# Patient Record
Sex: Male | Born: 1935 | Race: Black or African American | Hispanic: No | Marital: Married | Smoking: Never smoker
Health system: Southern US, Community
[De-identification: ages and names within clinical notes are randomized; demographics above are authoritative.]

## PROBLEM LIST (undated history)

## (undated) DIAGNOSIS — D649 Anemia, unspecified: Secondary | ICD-10-CM

## (undated) DIAGNOSIS — C801 Malignant (primary) neoplasm, unspecified: Secondary | ICD-10-CM

## (undated) DIAGNOSIS — I10 Essential (primary) hypertension: Secondary | ICD-10-CM

## (undated) DIAGNOSIS — K219 Gastro-esophageal reflux disease without esophagitis: Secondary | ICD-10-CM

## (undated) DIAGNOSIS — I2510 Atherosclerotic heart disease of native coronary artery without angina pectoris: Secondary | ICD-10-CM

## (undated) DIAGNOSIS — I714 Abdominal aortic aneurysm, without rupture, unspecified: Secondary | ICD-10-CM

## (undated) DIAGNOSIS — I219 Acute myocardial infarction, unspecified: Secondary | ICD-10-CM

## (undated) DIAGNOSIS — M1990 Unspecified osteoarthritis, unspecified site: Secondary | ICD-10-CM

## (undated) DIAGNOSIS — F0390 Unspecified dementia without behavioral disturbance: Secondary | ICD-10-CM

## (undated) DIAGNOSIS — G4730 Sleep apnea, unspecified: Secondary | ICD-10-CM

## (undated) DIAGNOSIS — M069 Rheumatoid arthritis, unspecified: Secondary | ICD-10-CM

## (undated) DIAGNOSIS — Z8719 Personal history of other diseases of the digestive system: Secondary | ICD-10-CM

## (undated) HISTORY — PX: CORONARY ARTERY BYPASS GRAFT: SHX141

## (undated) HISTORY — DX: Abdominal aortic aneurysm, without rupture, unspecified: I71.40

## (undated) HISTORY — DX: Abdominal aortic aneurysm, without rupture: I71.4

---

## 1998-11-11 ENCOUNTER — Ambulatory Visit (HOSPITAL_COMMUNITY): Admission: RE | Admit: 1998-11-11 | Discharge: 1998-11-11 | Payer: Self-pay | Admitting: Internal Medicine

## 2001-11-21 ENCOUNTER — Encounter: Payer: Self-pay | Admitting: Urology

## 2001-11-21 ENCOUNTER — Encounter: Admission: RE | Admit: 2001-11-21 | Discharge: 2001-11-21 | Payer: Self-pay | Admitting: Urology

## 2001-11-26 ENCOUNTER — Encounter: Admission: RE | Admit: 2001-11-26 | Discharge: 2001-11-26 | Payer: Self-pay | Admitting: Urology

## 2001-11-26 ENCOUNTER — Encounter: Payer: Self-pay | Admitting: Urology

## 2001-12-26 ENCOUNTER — Ambulatory Visit: Admission: RE | Admit: 2001-12-26 | Discharge: 2002-03-26 | Payer: Self-pay | Admitting: Radiation Oncology

## 2002-02-14 ENCOUNTER — Encounter: Payer: Self-pay | Admitting: Urology

## 2002-02-14 ENCOUNTER — Encounter: Admission: RE | Admit: 2002-02-14 | Discharge: 2002-02-14 | Payer: Self-pay | Admitting: Urology

## 2002-03-24 ENCOUNTER — Ambulatory Visit (HOSPITAL_BASED_OUTPATIENT_CLINIC_OR_DEPARTMENT_OTHER): Admission: RE | Admit: 2002-03-24 | Discharge: 2002-03-24 | Payer: Self-pay | Admitting: Urology

## 2002-04-10 ENCOUNTER — Ambulatory Visit: Admission: RE | Admit: 2002-04-10 | Discharge: 2002-07-09 | Payer: Self-pay | Admitting: Radiation Oncology

## 2002-12-08 ENCOUNTER — Ambulatory Visit (HOSPITAL_COMMUNITY): Admission: RE | Admit: 2002-12-08 | Discharge: 2002-12-08 | Payer: Self-pay | Admitting: Ophthalmology

## 2003-12-20 ENCOUNTER — Inpatient Hospital Stay (HOSPITAL_COMMUNITY): Admission: EM | Admit: 2003-12-20 | Discharge: 2003-12-21 | Payer: Self-pay | Admitting: Emergency Medicine

## 2004-02-01 ENCOUNTER — Ambulatory Visit (HOSPITAL_COMMUNITY): Admission: RE | Admit: 2004-02-01 | Discharge: 2004-02-01 | Payer: Self-pay | Admitting: Internal Medicine

## 2004-05-17 ENCOUNTER — Ambulatory Visit (HOSPITAL_COMMUNITY): Admission: RE | Admit: 2004-05-17 | Discharge: 2004-05-17 | Payer: Self-pay | Admitting: Ophthalmology

## 2004-06-16 ENCOUNTER — Ambulatory Visit (HOSPITAL_COMMUNITY): Admission: RE | Admit: 2004-06-16 | Discharge: 2004-06-16 | Payer: Self-pay | Admitting: Internal Medicine

## 2004-12-26 ENCOUNTER — Ambulatory Visit (HOSPITAL_COMMUNITY): Admission: RE | Admit: 2004-12-26 | Discharge: 2004-12-26 | Payer: Self-pay | Admitting: Internal Medicine

## 2006-03-07 ENCOUNTER — Encounter: Payer: Self-pay | Admitting: Internal Medicine

## 2006-10-31 ENCOUNTER — Encounter: Payer: Self-pay | Admitting: Cardiology

## 2006-10-31 ENCOUNTER — Ambulatory Visit: Payer: Self-pay

## 2007-02-23 ENCOUNTER — Emergency Department (HOSPITAL_COMMUNITY): Admission: EM | Admit: 2007-02-23 | Discharge: 2007-02-23 | Payer: Self-pay | Admitting: Emergency Medicine

## 2007-02-24 ENCOUNTER — Ambulatory Visit: Payer: Self-pay | Admitting: Vascular Surgery

## 2007-02-24 ENCOUNTER — Emergency Department (HOSPITAL_COMMUNITY): Admission: EM | Admit: 2007-02-24 | Discharge: 2007-02-24 | Payer: Self-pay | Admitting: Emergency Medicine

## 2007-02-24 ENCOUNTER — Encounter: Payer: Self-pay | Admitting: Vascular Surgery

## 2007-03-14 ENCOUNTER — Ambulatory Visit (HOSPITAL_COMMUNITY): Admission: RE | Admit: 2007-03-14 | Discharge: 2007-03-14 | Payer: Self-pay | Admitting: Internal Medicine

## 2008-04-16 ENCOUNTER — Inpatient Hospital Stay (HOSPITAL_COMMUNITY): Admission: AD | Admit: 2008-04-16 | Discharge: 2008-04-21 | Payer: Self-pay | Admitting: Cardiovascular Disease

## 2008-11-02 ENCOUNTER — Emergency Department (HOSPITAL_COMMUNITY): Admission: EM | Admit: 2008-11-02 | Discharge: 2008-11-02 | Payer: Self-pay | Admitting: Emergency Medicine

## 2008-11-23 ENCOUNTER — Ambulatory Visit: Payer: Self-pay | Admitting: Cardiothoracic Surgery

## 2008-11-23 ENCOUNTER — Inpatient Hospital Stay (HOSPITAL_COMMUNITY): Admission: RE | Admit: 2008-11-23 | Discharge: 2008-12-07 | Payer: Self-pay | Admitting: Cardiovascular Disease

## 2008-11-23 HISTORY — PX: CARDIAC CATHETERIZATION: SHX172

## 2008-11-24 ENCOUNTER — Ambulatory Visit: Payer: Self-pay | Admitting: Dentistry

## 2008-11-24 ENCOUNTER — Encounter: Payer: Self-pay | Admitting: Cardiothoracic Surgery

## 2008-11-30 ENCOUNTER — Encounter: Payer: Self-pay | Admitting: Cardiothoracic Surgery

## 2008-11-30 HISTORY — PX: AORTIC VALVE REPLACEMENT (AVR)/CORONARY ARTERY BYPASS GRAFTING (CABG): SHX5725

## 2008-12-25 ENCOUNTER — Encounter: Admission: RE | Admit: 2008-12-25 | Discharge: 2008-12-25 | Payer: Self-pay | Admitting: Cardiothoracic Surgery

## 2008-12-25 ENCOUNTER — Ambulatory Visit: Payer: Self-pay | Admitting: Cardiothoracic Surgery

## 2009-01-15 ENCOUNTER — Ambulatory Visit: Payer: Self-pay | Admitting: Cardiothoracic Surgery

## 2009-01-15 ENCOUNTER — Encounter: Admission: RE | Admit: 2009-01-15 | Discharge: 2009-01-15 | Payer: Self-pay | Admitting: Cardiothoracic Surgery

## 2009-03-01 ENCOUNTER — Ambulatory Visit (HOSPITAL_COMMUNITY): Admission: RE | Admit: 2009-03-01 | Discharge: 2009-03-01 | Payer: Self-pay | Admitting: Gastroenterology

## 2009-03-01 ENCOUNTER — Encounter (INDEPENDENT_AMBULATORY_CARE_PROVIDER_SITE_OTHER): Payer: Self-pay | Admitting: Gastroenterology

## 2009-08-25 IMAGING — CR DG CHEST 2V
2 series · 2 of 2 positions shown · non-contrast
Comparison: 11/02/2008

CLINICAL DATA: Preoperative exam prior to valve replacement

CHEST - 2 VIEW

[w chest pa]
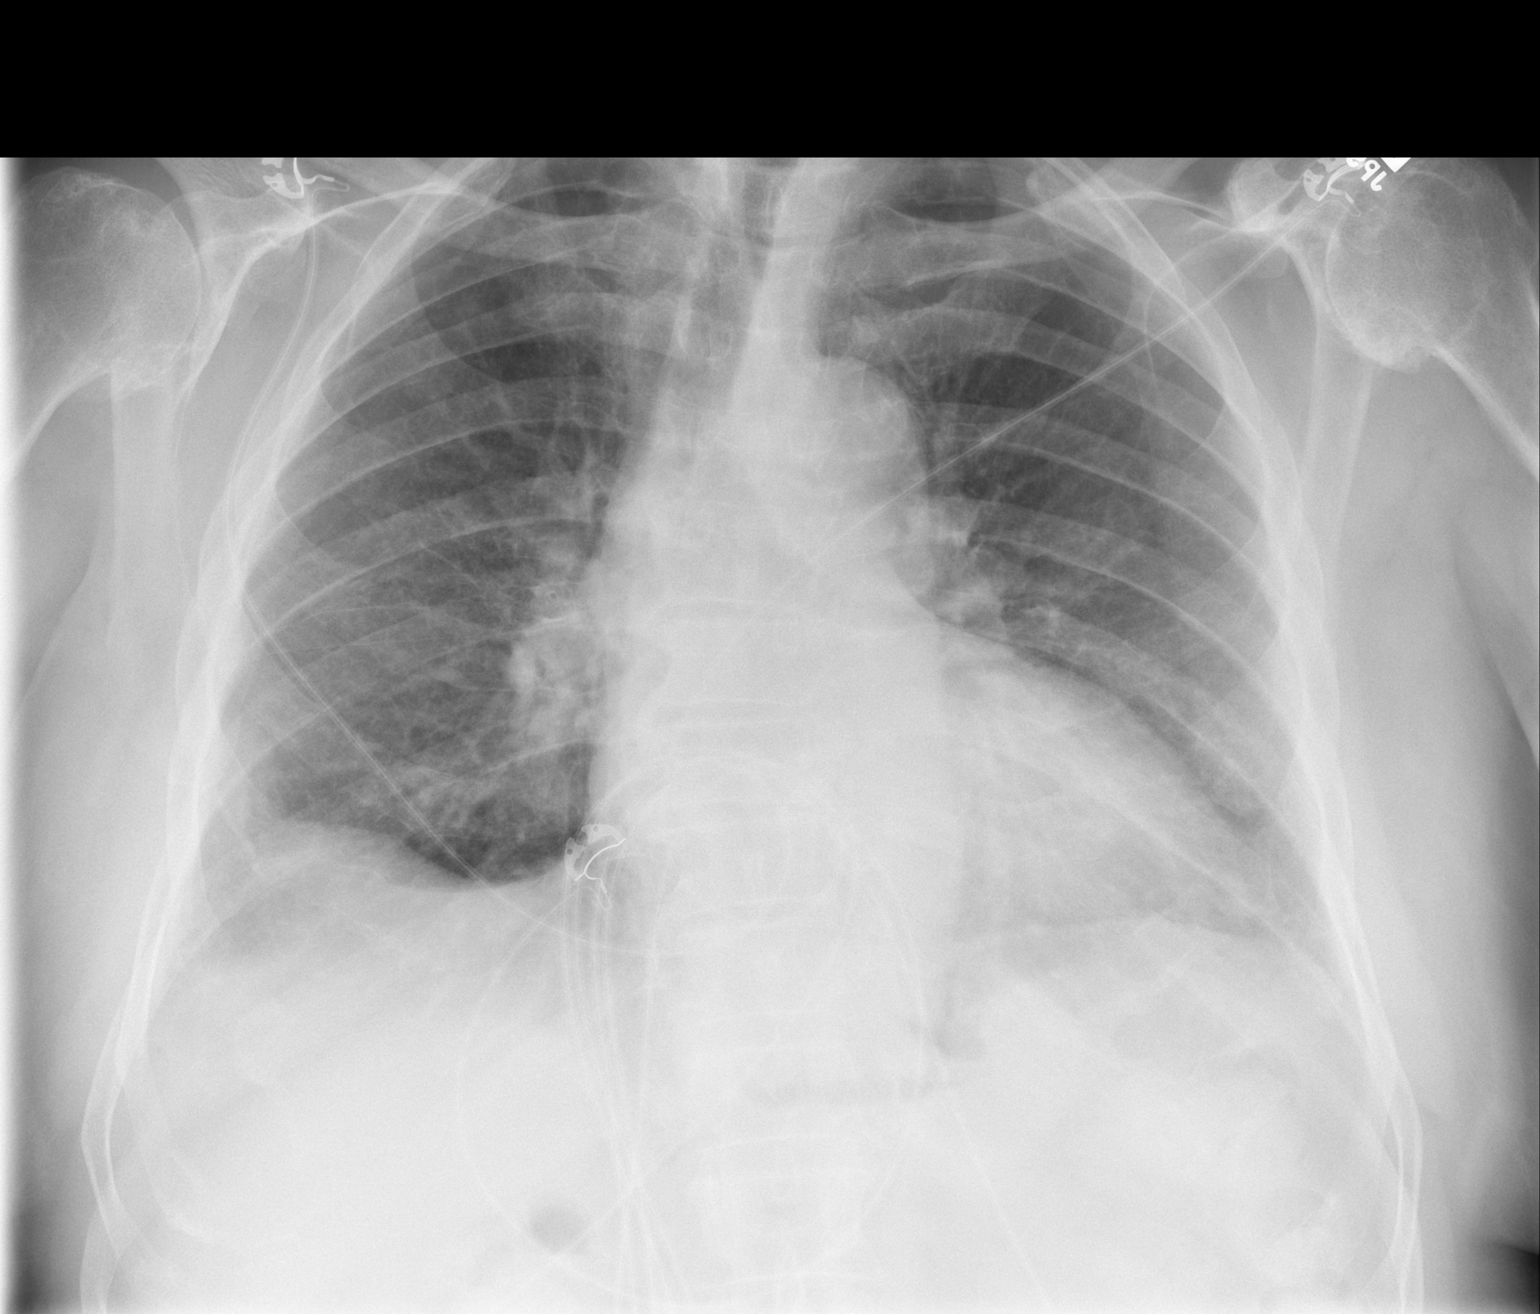

[w chest lat]
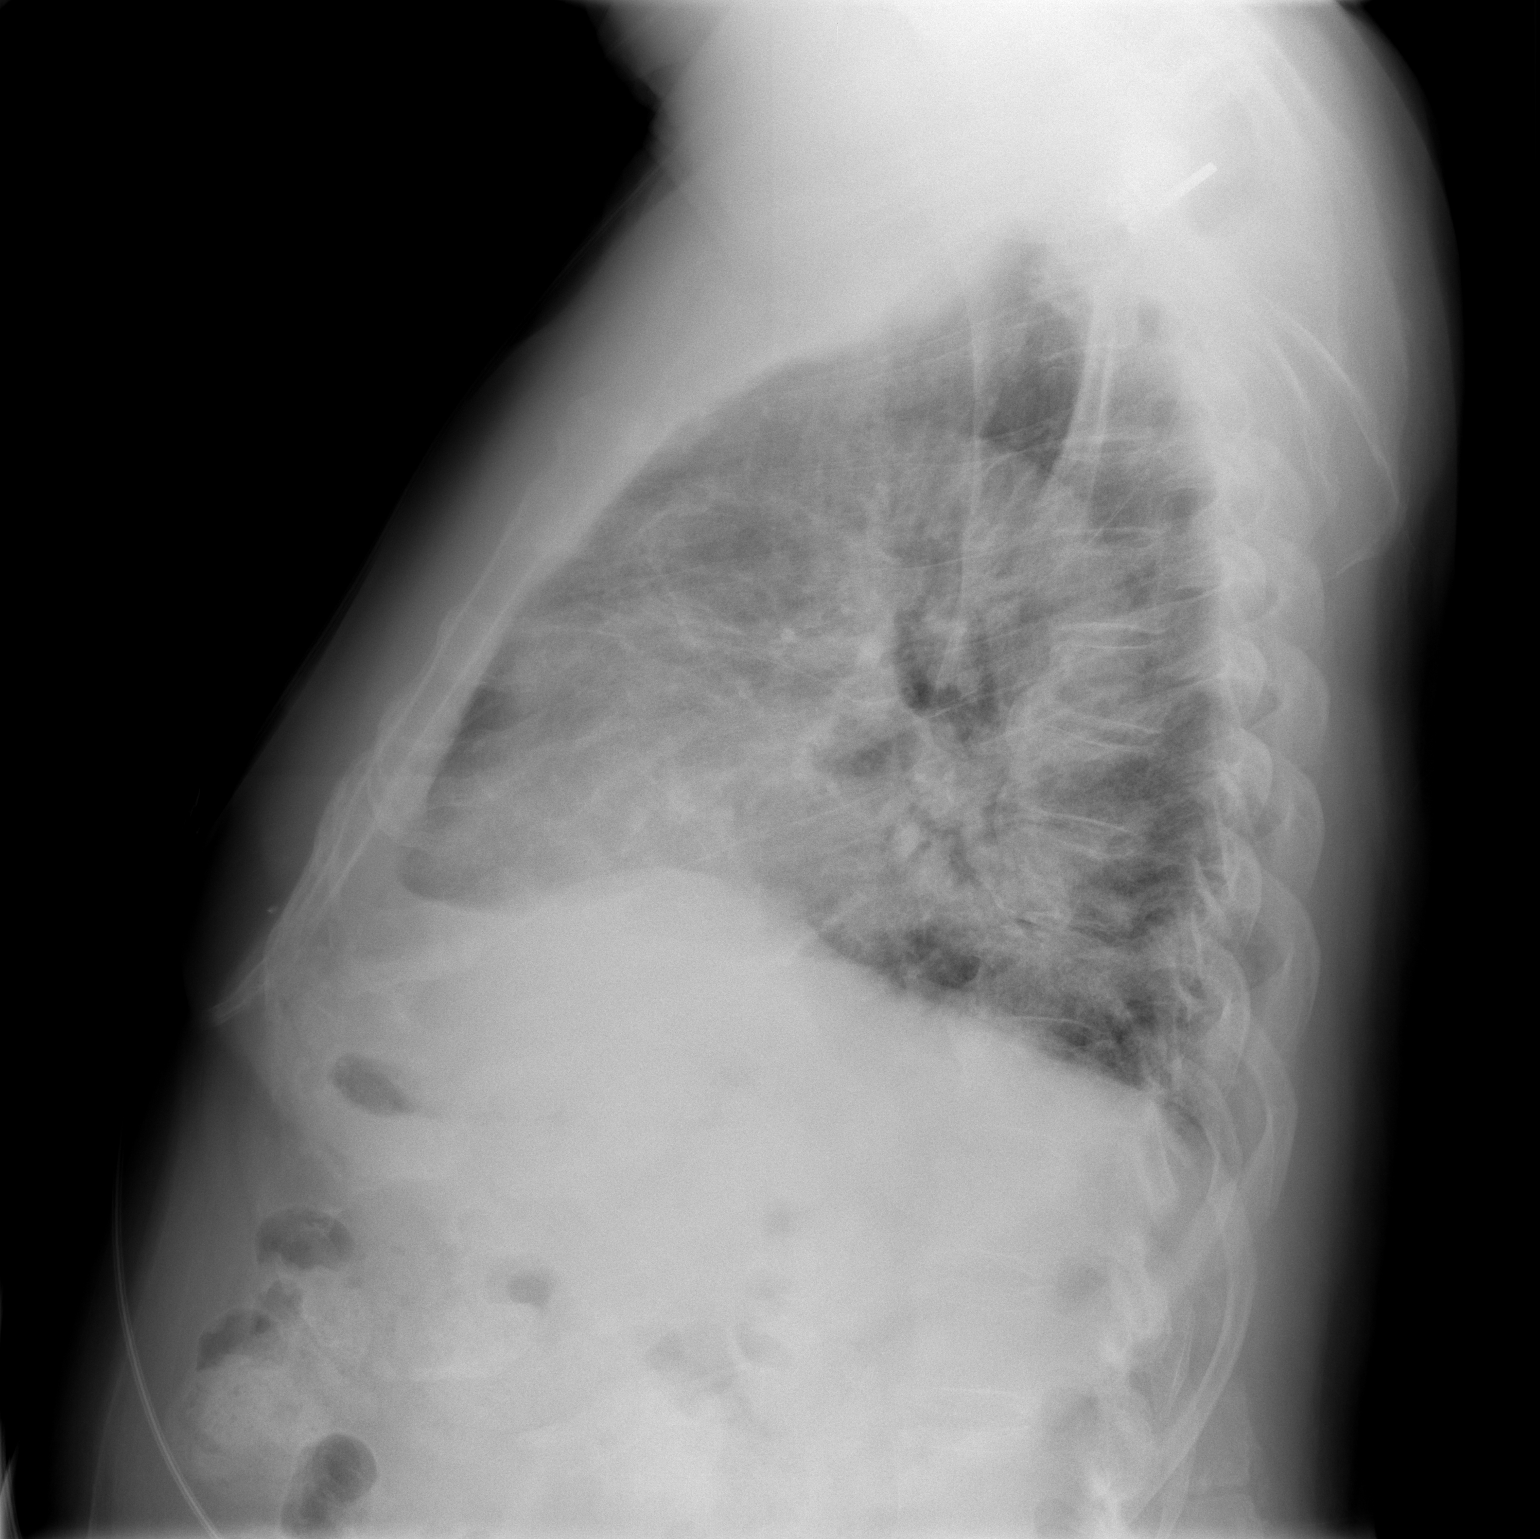

[2 of 2 positions shown; findings below may reference images not displayed]

FINDINGS: Borderline cardiomegaly is stable without edema.
Flattening of hemidiaphragms may suggest COPD.  Persistent
bilateral lower lobe scarring noted with overall hypo aeration.  No
new pulmonary opacity. Bilateral glenohumeral joint degenerative
change identified.
IMPRESSION: No acute focal finding.

## 2009-09-24 IMAGING — CR DG CHEST 2V
2 series · 2 of 2 positions shown · non-contrast
Comparison: 12/04/2008

CLINICAL DATA: Status post CABG 11/30/2008.  Shortness of breath.

CHEST - 2 VIEW

[w chest pa]
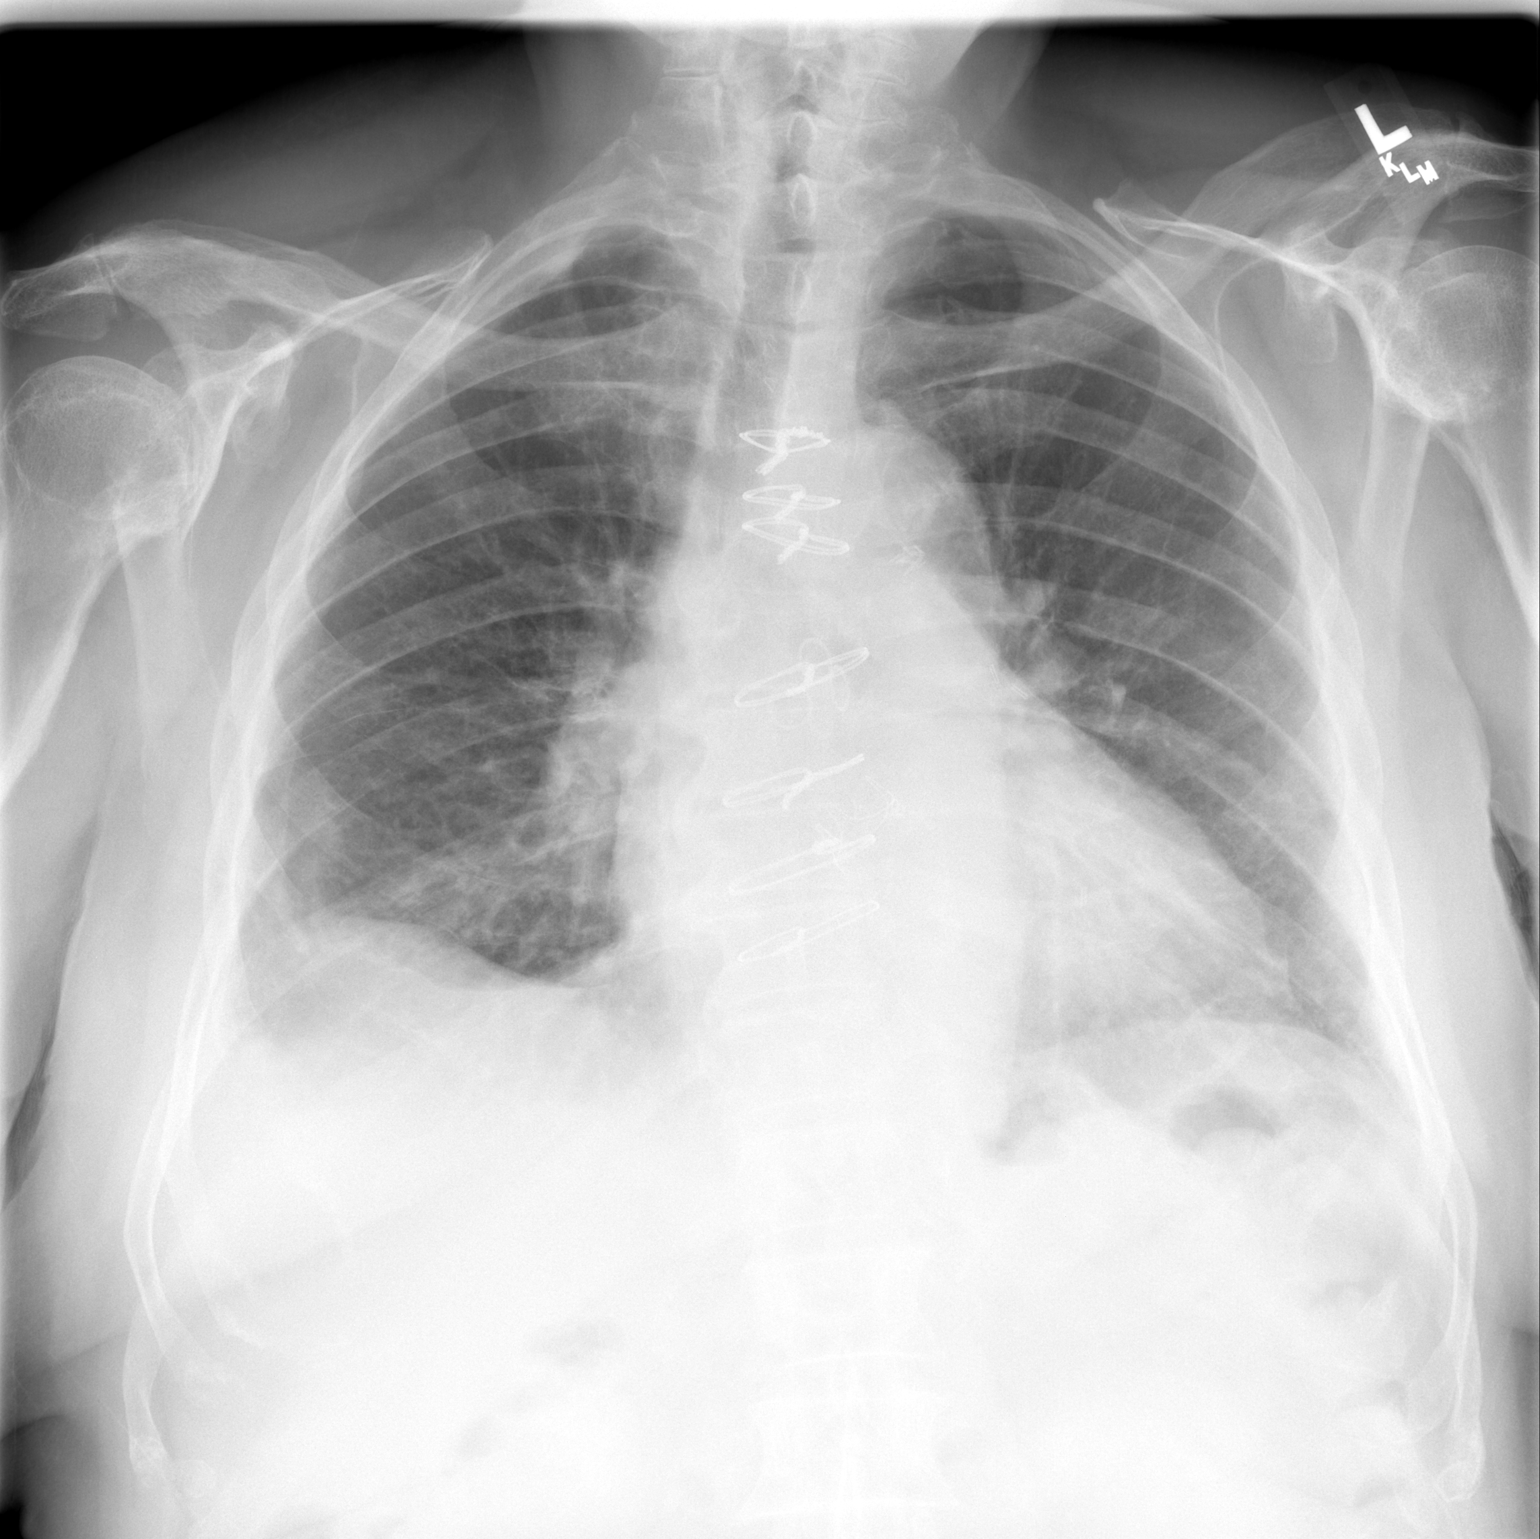

[w chest lat]
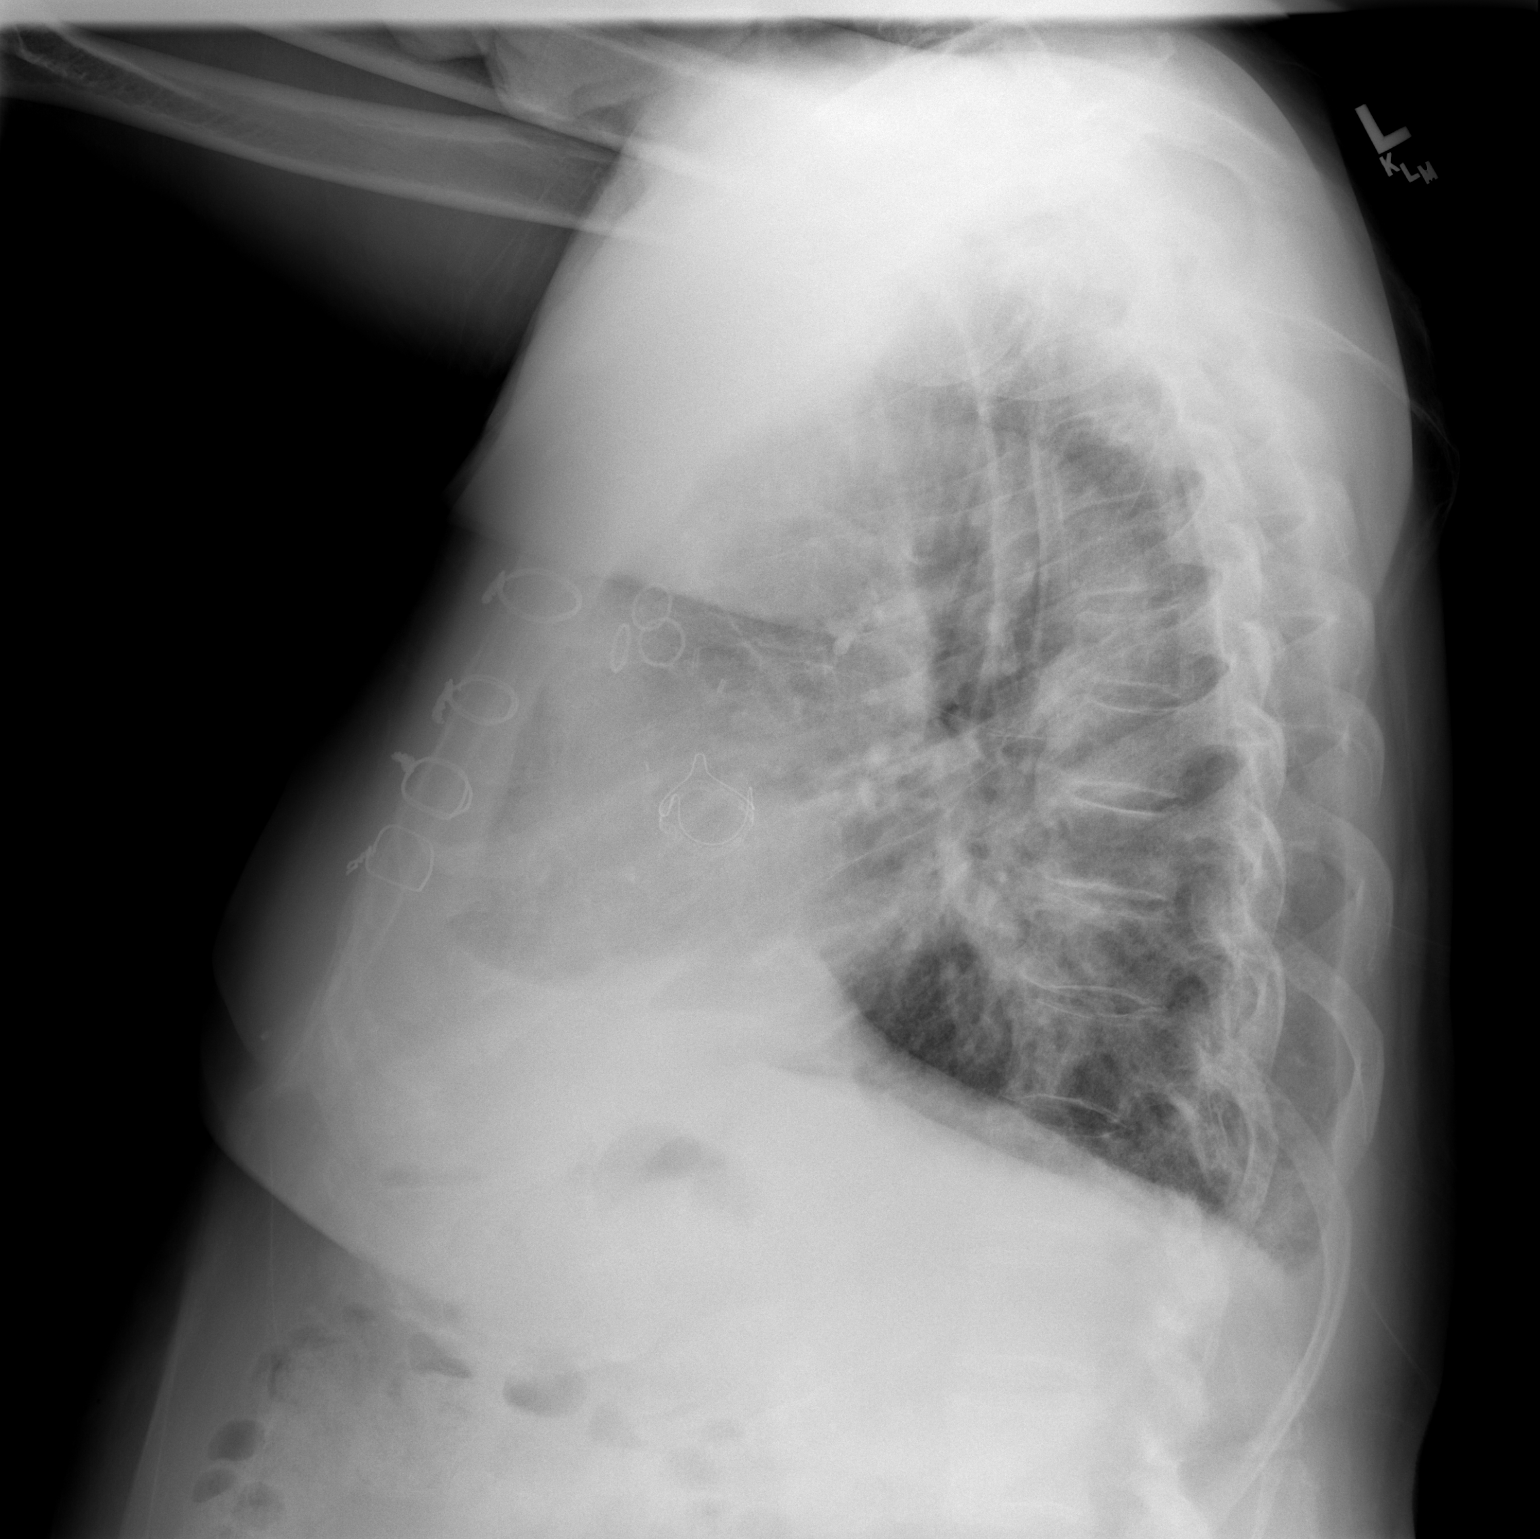

[2 of 2 positions shown; findings below may reference images not displayed]

FINDINGS: Prior median sternotomy. Aortic valve replacement.  Mid
thoracic spondylosis. Midline trachea. Mild cardiomegaly
accentuated by low lung volumes.  Resolved left and nearly resolved
tiny right pleural effusion. No pneumothorax.  Right apical pleural
thickening.  Resolved interstitial edema.  Residual pulmonary
interstitial prominence felt to be due to low volumes.  Improved
right greater than left bibasilar atelectasis with mild elevation
of the right hemidiaphragm.
IMPRESSION: 1.  Improved aeration with a tiny right pleural effusion and
decreased bibasilar atelectasis remaining.
2.  Cardiomegaly with resolved congestive failure.

## 2009-12-21 ENCOUNTER — Ambulatory Visit: Payer: Self-pay | Admitting: Vascular Surgery

## 2010-06-16 ENCOUNTER — Encounter: Admission: RE | Admit: 2010-06-16 | Discharge: 2010-08-05 | Payer: Self-pay | Admitting: Cardiovascular Disease

## 2010-11-23 ENCOUNTER — Ambulatory Visit (HOSPITAL_COMMUNITY)
Admission: RE | Admit: 2010-11-23 | Discharge: 2010-11-23 | Payer: Self-pay | Source: Home / Self Care | Attending: Gastroenterology | Admitting: Gastroenterology

## 2010-11-25 NOTE — Op Note (Signed)
  NAME:  [name] [name], [name] [name]              ACCOUNT NO.:  [id number]  MEDICAL RECORD NO.:  [id number]          PATIENT TYPE:  AMB  LOCATION:  ENDO                         FACILITY:  [facility]  PHYSICIAN:  Richard A. Alanda Amass, M.D.DATE OF BIRTH:  Mar 03, 1925  DATE OF PROCEDURE: DATE OF DISCHARGE:                              OPERATIVE REPORT   INDICATION:  Heme-positive stool, history of polyps.  MEDICATIONS: 1. Fentanyl 35 mcg IV. 2. Versed 3 mg IV.  FINDINGS:  Rectal exam was normal.  A pediatric colonoscope was inserted into a well prepped colon and on insertion, there was a 2-cm carpet like polyp in the transverse colon that was seen.  Cold biopsies were taken for histologic purposes.  The 4 mL of spot were injected in and around the polyp for tattooing.  Further insertion of the colonoscope, the ileocecal valve was noted to have a sessile appearance and biopsy was taken for histologic purposes to check for the lipoma.  The colonoscope was inserted into the cecum where the ileocecal valve and appendiceal orifice were identified.  On careful withdrawal of the colonoscope, the above findings were seen as previously described.  In the sigmoid colon, there was a 5-mm sessile polyp that was removed with snare cautery. Retroflexion was done which revealed medium-sized internal hemorrhoids.  ASSESSMENT: 1. Flat transverse polyps (carpet like appearance), status post cold     biopsies, non-amenable to endoscopic resection,  status post     tattooing. 2. Prominent ileocecal valve, status post biopsies to check for     lipoma. 3. Sigmoid colon polyps status post snare cautery. 4. Medium-sized internal hemorrhoids.  PLAN: 1. Follow up one path. 2. Surgical evaluation regarding flat polyp depending on histologic     diagnosis.  Hold Coumadin for now 3 days and then resume.     [name], MD   ______________________________ Pearletha Furl Alanda Amass, M.D.    VCS/MEDQ  D:   11/23/2010  T:  11/23/2010  Job:  [id number]  cc:   Merlene Laughter. Renae Gloss, M.D. Fax: [phone]  Electronically Signed by Charlott Rakes MD on 11/25/2010 10:45:45 AM

## 2010-11-25 NOTE — Op Note (Signed)
  NAME:  [name], [name]              ACCOUNT NO.:  [id number]  MEDICAL RECORD NO.:  [id number]          PATIENT TYPE:  AMB  LOCATION:  ENDO                         FACILITY:  [facility]  PHYSICIAN:  [name], MDDATE OF BIRTH:  05-Nov-1925  DATE OF PROCEDURE: DATE OF DISCHARGE:                              OPERATIVE REPORT   INDICATION:  Heme-positive stool.  MEDICATIONS:  Fentanyl 10 mcg IV, Versed 1 mg IV, Cetacaine spray x2, additional medicine given for preceding colonoscopy.  FINDINGS:  Endoscope was inserted into the oropharynx and esophagus was intubated which was normal in its entirety.  Endoscope was advanced into the stomach which revealed normal-appearing gastric mucosa. Retroflexion was done which revealed a small hiatal hernia but otherwise normal proximal stomach.  Endoscope was straightened and advanced into the duodenal bulb where there was a focal area of superficial clean- based ulcerations with surrounding erythema and edema.  Endoscope was advanced to the second portion of the duodenum which was unremarkable. Endoscope was withdrawn back into the stomach and a biopsy was taken for histologic purposes to check for Helicobacter pylori and inflammation. Endoscope was then withdrawn to confirm the above findings.  ASSESSMENT: 1. Duodenal ulcerations and edema consistent with duodenitis. 2. Small hiatal hernia.  PLAN: 1. Follow up on pathology. 2. Treat. 3. If biopsies positive for Helicobacter pylori, then we will treat     accordingly.     [name], MD     VCS/MEDQ  D:  11/23/2010  T:  11/23/2010  Job:  [id number]  cc:   Gerlene Burdock A. Alanda Amass, M.D. Fax: [phone]  Merlene Laughter. Renae Gloss, M.D. Fax: [phone]  Electronically Signed by Charlott Rakes MD on 11/25/2010 10:45:40 AM

## 2010-11-26 ENCOUNTER — Other Ambulatory Visit: Payer: Self-pay | Admitting: Vascular Surgery

## 2010-11-26 DIAGNOSIS — I722 Aneurysm of renal artery: Secondary | ICD-10-CM

## 2010-12-27 ENCOUNTER — Ambulatory Visit
Admission: RE | Admit: 2010-12-27 | Discharge: 2010-12-27 | Disposition: A | Discharge status: Home / Self Care | Payer: MEDICARE | Source: Ambulatory Visit | Attending: Vascular Surgery | Admitting: Vascular Surgery

## 2010-12-27 ENCOUNTER — Other Ambulatory Visit: Payer: Self-pay

## 2010-12-27 ENCOUNTER — Ambulatory Visit (INDEPENDENT_AMBULATORY_CARE_PROVIDER_SITE_OTHER): Payer: MEDICARE | Admitting: Vascular Surgery

## 2010-12-27 DIAGNOSIS — I722 Aneurysm of renal artery: Secondary | ICD-10-CM

## 2010-12-27 MED ORDER — IOHEXOL 350 MG/ML SOLN
125.0000 mL | Freq: Once | INTRAVENOUS | Status: AC | PRN
  Administered 2010-12-27: 125 mL via INTRAVENOUS

## 2010-12-28 NOTE — Assessment & Plan Note (Signed)
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 Lindaann Slough a year ago.  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  Today he returns with no change in his symptomatology.  I ordered a CT angiogram to be performed today which I have reviewed by computer.  It appears that the aortic aneurysm is unchanged at approximately 4 cm and there has been minimal change in the appearance or size of the bilateral renal artery aneurysms.  CHRONIC MEDICAL PROBLEMS: 1. Hyperlipidemia. 2. Hypertension. 3. History of prostate cancer treated with seed implants. 4. History of DVT right leg with pulmonary embolus. 5. Adult sleep apnea. 6. Negative for stroke or coronary artery disease.  FAMILY HISTORY:  Positive for coronary artery disease in his mother. Negative for diabetes and stroke.  SOCIAL HISTORY:  He quit smoking 4 years ago.  He does not use alcohol. He is retired.  REVIEW OF SYSTEMS:  Occasional chest discomfort.  He does have reflux esophagitis, occasional heartburn, urinary frequency, discomfort in his legs with walking.  All other systems are negative in a complete review of systems.  PHYSICAL EXAMINATION:  Blood pressure 144/87, heart rate 65, respirations 20.  General:  He is an obese male who is in no apparent distress, alert, and oriented x3.  HEENT:  Exam normal for age.  EOMs intact.  Lungs:  Clear to auscultation.  No rhonchi or wheezing. Cardiovascular:  Regular rhythm, no murmurs.  Carotid pulses 3+,  no audible bruits.  Abdomen:  Soft, nontender, no  pulsatile mass is appreciated.  No bruits are heard.  He has 3+ femoral pulses bilaterally with well-perfused lower extremities with 1+ edema.  As noted I did review the CT angiogram today and I do not appreciate any significant change from a year ago.  We will see him back in 2 years with  repeat CT angiogram to monitor his aortic aneurysm and bilateral renal artery aneurysms.    Quita Skye Hart [place], M.D. Electronically Signed  JDL/MEDQ  D:  12/27/2010  T:  12/28/2010  Job:  [id number]

## 2011-02-20 LAB — GLUCOSE, CAPILLARY
Glucose-Capillary: 107 mg/dL — ABNORMAL HIGH (ref 70–99)
Glucose-Capillary: 110 mg/dL — ABNORMAL HIGH (ref 70–99)
Glucose-Capillary: 117 mg/dL — ABNORMAL HIGH (ref 70–99)
Glucose-Capillary: 120 mg/dL — ABNORMAL HIGH (ref 70–99)
Glucose-Capillary: 122 mg/dL — ABNORMAL HIGH (ref 70–99)
Glucose-Capillary: 129 mg/dL — ABNORMAL HIGH (ref 70–99)
Glucose-Capillary: 132 mg/dL — ABNORMAL HIGH (ref 70–99)
Glucose-Capillary: 136 mg/dL — ABNORMAL HIGH (ref 70–99)
Glucose-Capillary: 154 mg/dL — ABNORMAL HIGH (ref 70–99)
Glucose-Capillary: 164 mg/dL — ABNORMAL HIGH (ref 70–99)
Glucose-Capillary: 167 mg/dL — ABNORMAL HIGH (ref 70–99)
Glucose-Capillary: 174 mg/dL — ABNORMAL HIGH (ref 70–99)
Glucose-Capillary: 186 mg/dL — ABNORMAL HIGH (ref 70–99)
Glucose-Capillary: 76 mg/dL (ref 70–99)
Glucose-Capillary: 96 mg/dL (ref 70–99)
Glucose-Capillary: 99 mg/dL (ref 70–99)

## 2011-02-20 LAB — URINALYSIS, ROUTINE W REFLEX MICROSCOPIC
Bilirubin Urine: NEGATIVE
Bilirubin Urine: NEGATIVE
Glucose, UA: NEGATIVE mg/dL
Glucose, UA: NEGATIVE mg/dL
Ketones, ur: NEGATIVE mg/dL
Ketones, ur: NEGATIVE mg/dL
Leukocytes, UA: NEGATIVE
Nitrite: NEGATIVE
Nitrite: NEGATIVE
Protein, ur: NEGATIVE mg/dL
Specific Gravity, Urine: 1.018 (ref 1.005–1.030)
Specific Gravity, Urine: 1.019 (ref 1.005–1.030)
Urobilinogen, UA: 1 mg/dL (ref 0.0–1.0)
pH: 5.5 (ref 5.0–8.0)
pH: 7 (ref 5.0–8.0)

## 2011-02-20 LAB — CROSSMATCH
ABO/RH(D): A POS
ABO/RH(D): A POS
Antibody Screen: NEGATIVE
Antibody Screen: NEGATIVE

## 2011-02-20 LAB — BASIC METABOLIC PANEL
BUN: 10 mg/dL (ref 6–23)
BUN: 14 mg/dL (ref 6–23)
BUN: 17 mg/dL (ref 6–23)
BUN: 19 mg/dL (ref 6–23)
BUN: 20 mg/dL (ref 6–23)
BUN: 20 mg/dL (ref 6–23)
BUN: 20 mg/dL (ref 6–23)
BUN: 22 mg/dL (ref 6–23)
BUN: 9 mg/dL (ref 6–23)
CO2: 25 mEq/L (ref 19–32)
CO2: 26 mEq/L (ref 19–32)
CO2: 27 mEq/L (ref 19–32)
CO2: 28 mEq/L (ref 19–32)
CO2: 28 mEq/L (ref 19–32)
CO2: 28 mEq/L (ref 19–32)
CO2: 32 mEq/L (ref 19–32)
Calcium: 8 mg/dL — ABNORMAL LOW (ref 8.4–10.5)
Calcium: 8.2 mg/dL — ABNORMAL LOW (ref 8.4–10.5)
Calcium: 8.2 mg/dL — ABNORMAL LOW (ref 8.4–10.5)
Calcium: 8.5 mg/dL (ref 8.4–10.5)
Calcium: 8.6 mg/dL (ref 8.4–10.5)
Calcium: 8.6 mg/dL (ref 8.4–10.5)
Calcium: 8.6 mg/dL (ref 8.4–10.5)
Calcium: 8.8 mg/dL (ref 8.4–10.5)
Calcium: 9.1 mg/dL (ref 8.4–10.5)
Calcium: 9.5 mg/dL (ref 8.4–10.5)
Chloride: 100 mEq/L (ref 96–112)
Chloride: 101 mEq/L (ref 96–112)
Chloride: 103 mEq/L (ref 96–112)
Chloride: 103 mEq/L (ref 96–112)
Chloride: 105 mEq/L (ref 96–112)
Chloride: 108 mEq/L (ref 96–112)
Chloride: 97 mEq/L (ref 96–112)
Chloride: 98 mEq/L (ref 96–112)
Creatinine, Ser: 1.23 mg/dL (ref 0.4–1.5)
Creatinine, Ser: 1.27 mg/dL (ref 0.4–1.5)
Creatinine, Ser: 1.35 mg/dL (ref 0.4–1.5)
Creatinine, Ser: 1.38 mg/dL (ref 0.4–1.5)
Creatinine, Ser: 1.45 mg/dL (ref 0.4–1.5)
Creatinine, Ser: 1.45 mg/dL (ref 0.4–1.5)
Creatinine, Ser: 1.46 mg/dL (ref 0.4–1.5)
Creatinine, Ser: 1.48 mg/dL (ref 0.4–1.5)
Creatinine, Ser: 1.63 mg/dL — ABNORMAL HIGH (ref 0.4–1.5)
GFR calc Af Amer: 49 mL/min — ABNORMAL LOW (ref >60)
GFR calc Af Amer: 55 mL/min — ABNORMAL LOW (ref >60)
GFR calc Af Amer: 56 mL/min — ABNORMAL LOW (ref >60)
GFR calc Af Amer: 57 mL/min — ABNORMAL LOW (ref >60)
GFR calc Af Amer: 57 mL/min — ABNORMAL LOW (ref >60)
GFR calc Af Amer: 59 mL/min — ABNORMAL LOW (ref >60)
GFR calc Af Amer: >60 mL/min (ref >60)
GFR calc Af Amer: >60 mL/min (ref >60)
GFR calc Af Amer: >60 mL/min (ref >60)
GFR calc Af Amer: >60 mL/min (ref >60)
GFR calc non Af Amer: 41 mL/min — ABNORMAL LOW (ref >60)
GFR calc non Af Amer: 46 mL/min — ABNORMAL LOW (ref >60)
GFR calc non Af Amer: 46 mL/min — ABNORMAL LOW (ref >60)
GFR calc non Af Amer: 47 mL/min — ABNORMAL LOW (ref >60)
GFR calc non Af Amer: 47 mL/min — ABNORMAL LOW (ref >60)
GFR calc non Af Amer: 49 mL/min — ABNORMAL LOW (ref >60)
GFR calc non Af Amer: 49 mL/min — ABNORMAL LOW (ref >60)
GFR calc non Af Amer: 51 mL/min — ABNORMAL LOW (ref >60)
GFR calc non Af Amer: 54 mL/min — ABNORMAL LOW (ref >60)
GFR calc non Af Amer: 56 mL/min — ABNORMAL LOW (ref >60)
GFR calc non Af Amer: 58 mL/min — ABNORMAL LOW (ref >60)
Glucose, Bld: 104 mg/dL — ABNORMAL HIGH (ref 70–99)
Glucose, Bld: 104 mg/dL — ABNORMAL HIGH (ref 70–99)
Glucose, Bld: 110 mg/dL — ABNORMAL HIGH (ref 70–99)
Glucose, Bld: 115 mg/dL — ABNORMAL HIGH (ref 70–99)
Glucose, Bld: 138 mg/dL — ABNORMAL HIGH (ref 70–99)
Glucose, Bld: 142 mg/dL — ABNORMAL HIGH (ref 70–99)
Glucose, Bld: 180 mg/dL — ABNORMAL HIGH (ref 70–99)
Glucose, Bld: 97 mg/dL (ref 70–99)
Potassium: 2.9 mEq/L — ABNORMAL LOW (ref 3.5–5.1)
Potassium: 3.3 mEq/L — ABNORMAL LOW (ref 3.5–5.1)
Potassium: 3.5 mEq/L (ref 3.5–5.1)
Potassium: 3.7 mEq/L (ref 3.5–5.1)
Potassium: 3.8 mEq/L (ref 3.5–5.1)
Potassium: 3.8 mEq/L (ref 3.5–5.1)
Potassium: 4 mEq/L (ref 3.5–5.1)
Potassium: 4 mEq/L (ref 3.5–5.1)
Potassium: 4 mEq/L (ref 3.5–5.1)
Potassium: 4 mEq/L (ref 3.5–5.1)
Sodium: 129 mEq/L — ABNORMAL LOW (ref 135–145)
Sodium: 133 mEq/L — ABNORMAL LOW (ref 135–145)
Sodium: 135 mEq/L (ref 135–145)
Sodium: 137 mEq/L (ref 135–145)
Sodium: 138 mEq/L (ref 135–145)
Sodium: 139 mEq/L (ref 135–145)
Sodium: 139 mEq/L (ref 135–145)
Sodium: 139 mEq/L (ref 135–145)
Sodium: 140 mEq/L (ref 135–145)

## 2011-02-20 LAB — PREPARE FRESH FROZEN PLASMA

## 2011-02-20 LAB — CBC
HCT: 24.9 % — ABNORMAL LOW (ref 39.0–52.0)
HCT: 24.9 % — ABNORMAL LOW (ref 39.0–52.0)
HCT: 25.1 % — ABNORMAL LOW (ref 39.0–52.0)
HCT: 26.3 % — ABNORMAL LOW (ref 39.0–52.0)
HCT: 26.7 % — ABNORMAL LOW (ref 39.0–52.0)
HCT: 26.7 % — ABNORMAL LOW (ref 39.0–52.0)
HCT: 27.3 % — ABNORMAL LOW (ref 39.0–52.0)
HCT: 27.4 % — ABNORMAL LOW (ref 39.0–52.0)
HCT: 28 % — ABNORMAL LOW (ref 39.0–52.0)
HCT: 33.4 % — ABNORMAL LOW (ref 39.0–52.0)
HCT: 34.9 % — ABNORMAL LOW (ref 39.0–52.0)
HCT: 35.2 % — ABNORMAL LOW (ref 39.0–52.0)
Hemoglobin: 11.1 g/dL — ABNORMAL LOW (ref 13.0–17.0)
Hemoglobin: 11.4 g/dL — ABNORMAL LOW (ref 13.0–17.0)
Hemoglobin: 8.1 g/dL — ABNORMAL LOW (ref 13.0–17.0)
Hemoglobin: 8.4 g/dL — ABNORMAL LOW (ref 13.0–17.0)
Hemoglobin: 8.4 g/dL — ABNORMAL LOW (ref 13.0–17.0)
Hemoglobin: 8.9 g/dL — ABNORMAL LOW (ref 13.0–17.0)
Hemoglobin: 9 g/dL — ABNORMAL LOW (ref 13.0–17.0)
Hemoglobin: 9.1 g/dL — ABNORMAL LOW (ref 13.0–17.0)
Hemoglobin: 9.1 g/dL — ABNORMAL LOW (ref 13.0–17.0)
Hemoglobin: 9.1 g/dL — ABNORMAL LOW (ref 13.0–17.0)
Hemoglobin: 9.2 g/dL — ABNORMAL LOW (ref 13.0–17.0)
MCHC: 32.5 g/dL (ref 30.0–36.0)
MCHC: 32.7 g/dL (ref 30.0–36.0)
MCHC: 32.9 g/dL (ref 30.0–36.0)
MCHC: 33.1 g/dL (ref 30.0–36.0)
MCHC: 33.2 g/dL (ref 30.0–36.0)
MCHC: 33.2 g/dL (ref 30.0–36.0)
MCHC: 33.7 g/dL (ref 30.0–36.0)
MCHC: 33.7 g/dL (ref 30.0–36.0)
MCHC: 33.9 g/dL (ref 30.0–36.0)
MCHC: 33.9 g/dL (ref 30.0–36.0)
MCHC: 34 g/dL (ref 30.0–36.0)
MCV: 87.4 fL (ref 78.0–100.0)
MCV: 87.5 fL (ref 78.0–100.0)
MCV: 87.8 fL (ref 78.0–100.0)
MCV: 88.1 fL (ref 78.0–100.0)
MCV: 88.4 fL (ref 78.0–100.0)
MCV: 88.5 fL (ref 78.0–100.0)
MCV: 88.7 fL (ref 78.0–100.0)
MCV: 89.1 fL (ref 78.0–100.0)
MCV: 89.8 fL (ref 78.0–100.0)
MCV: 89.9 fL (ref 78.0–100.0)
MCV: 90.1 fL (ref 78.0–100.0)
Platelets: 108 10*3/uL — ABNORMAL LOW (ref 150–400)
Platelets: 113 10*3/uL — ABNORMAL LOW (ref 150–400)
Platelets: 113 10*3/uL — ABNORMAL LOW (ref 150–400)
Platelets: 116 10*3/uL — ABNORMAL LOW (ref 150–400)
Platelets: 118 10*3/uL — ABNORMAL LOW (ref 150–400)
Platelets: 123 10*3/uL — ABNORMAL LOW (ref 150–400)
Platelets: 150 10*3/uL (ref 150–400)
Platelets: 158 10*3/uL (ref 150–400)
Platelets: 158 10*3/uL (ref 150–400)
Platelets: 167 10*3/uL (ref 150–400)
Platelets: 168 10*3/uL (ref 150–400)
Platelets: 184 10*3/uL (ref 150–400)
RBC: 2.77 MIL/uL — ABNORMAL LOW (ref 4.22–5.81)
RBC: 2.84 MIL/uL — ABNORMAL LOW (ref 4.22–5.81)
RBC: 2.87 MIL/uL — ABNORMAL LOW (ref 4.22–5.81)
RBC: 2.99 MIL/uL — ABNORMAL LOW (ref 4.22–5.81)
RBC: 3.02 MIL/uL — ABNORMAL LOW (ref 4.22–5.81)
RBC: 3.02 MIL/uL — ABNORMAL LOW (ref 4.22–5.81)
RBC: 3.05 MIL/uL — ABNORMAL LOW (ref 4.22–5.81)
RBC: 3.07 MIL/uL — ABNORMAL LOW (ref 4.22–5.81)
RBC: 3.1 MIL/uL — ABNORMAL LOW (ref 4.22–5.81)
RBC: 3.77 MIL/uL — ABNORMAL LOW (ref 4.22–5.81)
RBC: 3.86 MIL/uL — ABNORMAL LOW (ref 4.22–5.81)
RBC: 3.93 MIL/uL — ABNORMAL LOW (ref 4.22–5.81)
RBC: 3.99 MIL/uL — ABNORMAL LOW (ref 4.22–5.81)
RBC: 4.17 MIL/uL — ABNORMAL LOW (ref 4.22–5.81)
RDW: 13.5 % (ref 11.5–15.5)
RDW: 13.6 % (ref 11.5–15.5)
RDW: 13.7 % (ref 11.5–15.5)
RDW: 13.8 % (ref 11.5–15.5)
RDW: 13.9 % (ref 11.5–15.5)
RDW: 13.9 % (ref 11.5–15.5)
RDW: 14 % (ref 11.5–15.5)
RDW: 14 % (ref 11.5–15.5)
RDW: 14.2 % (ref 11.5–15.5)
RDW: 14.3 % (ref 11.5–15.5)
RDW: 14.4 % (ref 11.5–15.5)
WBC: 11 10*3/uL — ABNORMAL HIGH (ref 4.0–10.5)
WBC: 11.5 10*3/uL — ABNORMAL HIGH (ref 4.0–10.5)
WBC: 11.7 10*3/uL — ABNORMAL HIGH (ref 4.0–10.5)
WBC: 6.8 10*3/uL (ref 4.0–10.5)
WBC: 7.2 10*3/uL (ref 4.0–10.5)
WBC: 7.2 10*3/uL (ref 4.0–10.5)
WBC: 7.2 10*3/uL (ref 4.0–10.5)
WBC: 7.7 10*3/uL (ref 4.0–10.5)
WBC: 7.8 10*3/uL (ref 4.0–10.5)
WBC: 8.5 10*3/uL (ref 4.0–10.5)
WBC: 8.9 10*3/uL (ref 4.0–10.5)
WBC: 9.5 10*3/uL (ref 4.0–10.5)
WBC: 9.9 10*3/uL (ref 4.0–10.5)

## 2011-02-20 LAB — MAGNESIUM
Magnesium: 2 mg/dL (ref 1.5–2.5)
Magnesium: 2.2 mg/dL (ref 1.5–2.5)
Magnesium: 2.4 mg/dL (ref 1.5–2.5)
Magnesium: 2.6 mg/dL — ABNORMAL HIGH (ref 1.5–2.5)

## 2011-02-20 LAB — POCT I-STAT, CHEM 8
BUN: 15 mg/dL (ref 6–23)
Calcium, Ion: 1.1 mmol/L — ABNORMAL LOW (ref 1.12–1.32)
Calcium, Ion: 1.12 mmol/L (ref 1.12–1.32)
Chloride: 104 mEq/L (ref 96–112)
Chloride: 95 mEq/L — ABNORMAL LOW (ref 96–112)
Creatinine, Ser: 1.2 mg/dL (ref 0.4–1.5)
Creatinine, Ser: 1.8 mg/dL — ABNORMAL HIGH (ref 0.4–1.5)
Glucose, Bld: 114 mg/dL — ABNORMAL HIGH (ref 70–99)
Glucose, Bld: 138 mg/dL — ABNORMAL HIGH (ref 70–99)
Glucose, Bld: 140 mg/dL — ABNORMAL HIGH (ref 70–99)
HCT: 23 % — ABNORMAL LOW (ref 39.0–52.0)
HCT: 24 % — ABNORMAL LOW (ref 39.0–52.0)
HCT: 26 % — ABNORMAL LOW (ref 39.0–52.0)
Hemoglobin: 7.8 g/dL — CL (ref 13.0–17.0)
Hemoglobin: 8.2 g/dL — ABNORMAL LOW (ref 13.0–17.0)
Potassium: 3.3 mEq/L — ABNORMAL LOW (ref 3.5–5.1)
Potassium: 3.4 mEq/L — ABNORMAL LOW (ref 3.5–5.1)

## 2011-02-20 LAB — POCT I-STAT 3, ART BLOOD GAS (G3+)
Acid-Base Excess: 2 mmol/L (ref 0.0–2.0)
Acid-base deficit: 1 mmol/L (ref 0.0–2.0)
Acid-base deficit: 1 mmol/L (ref 0.0–2.0)
Acid-base deficit: 3 mmol/L — ABNORMAL HIGH (ref 0.0–2.0)
Bicarbonate: 23.3 mEq/L (ref 20.0–24.0)
Bicarbonate: 23.4 mEq/L (ref 20.0–24.0)
Bicarbonate: 23.6 mEq/L (ref 20.0–24.0)
Bicarbonate: 24.8 mEq/L — ABNORMAL HIGH (ref 20.0–24.0)
Bicarbonate: 26.9 mEq/L — ABNORMAL HIGH (ref 20.0–24.0)
O2 Saturation: 100 %
O2 Saturation: 100 %
O2 Saturation: 89 %
O2 Saturation: 91 %
O2 Saturation: 93 %
Patient temperature: 35.2
Patient temperature: 37.2
TCO2: 24 mmol/L (ref 0–100)
TCO2: 24 mmol/L (ref 0–100)
TCO2: 25 mmol/L (ref 0–100)
TCO2: 25 mmol/L (ref 0–100)
TCO2: 26 mmol/L (ref 0–100)
TCO2: 28 mmol/L (ref 0–100)
pCO2 arterial: 32.4 mmHg — ABNORMAL LOW (ref 35.0–45.0)
pCO2 arterial: 37.4 mmHg (ref 35.0–45.0)
pCO2 arterial: 42.4 mmHg (ref 35.0–45.0)
pH, Arterial: 7.365 (ref 7.350–7.450)
pH, Arterial: 7.373 (ref 7.350–7.450)
pH, Arterial: 7.387 (ref 7.350–7.450)
pO2, Arterial: 141 mmHg — ABNORMAL HIGH (ref 80.0–100.0)
pO2, Arterial: 258 mmHg — ABNORMAL HIGH (ref 80.0–100.0)
pO2, Arterial: 263 mmHg — ABNORMAL HIGH (ref 80.0–100.0)
pO2, Arterial: 56 mmHg — ABNORMAL LOW (ref 80.0–100.0)
pO2, Arterial: 77 mmHg — ABNORMAL LOW (ref 80.0–100.0)

## 2011-02-20 LAB — PROTIME-INR
INR: 1.1 (ref 0.00–1.49)
INR: 1.2 (ref 0.00–1.49)
INR: 1.3 (ref 0.00–1.49)
INR: 1.3 (ref 0.00–1.49)
INR: 1.4 (ref 0.00–1.49)
INR: 1.5 (ref 0.00–1.49)
INR: 1.5 (ref 0.00–1.49)
Prothrombin Time: 13.8 seconds (ref 11.6–15.2)
Prothrombin Time: 14.3 seconds (ref 11.6–15.2)
Prothrombin Time: 16 seconds — ABNORMAL HIGH (ref 11.6–15.2)
Prothrombin Time: 16.5 seconds — ABNORMAL HIGH (ref 11.6–15.2)
Prothrombin Time: 16.8 seconds — ABNORMAL HIGH (ref 11.6–15.2)
Prothrombin Time: 17.6 seconds — ABNORMAL HIGH (ref 11.6–15.2)
Prothrombin Time: 18.7 seconds — ABNORMAL HIGH (ref 11.6–15.2)
Prothrombin Time: 18.8 seconds — ABNORMAL HIGH (ref 11.6–15.2)

## 2011-02-20 LAB — PREPARE PLATELETS

## 2011-02-20 LAB — POCT I-STAT 4, (NA,K, GLUC, HGB,HCT)
Glucose, Bld: 112 mg/dL — ABNORMAL HIGH (ref 70–99)
Glucose, Bld: 94 mg/dL (ref 70–99)
HCT: 21 % — ABNORMAL LOW (ref 39.0–52.0)
HCT: 28 % — ABNORMAL LOW (ref 39.0–52.0)
HCT: 32 % — ABNORMAL LOW (ref 39.0–52.0)
HCT: 33 % — ABNORMAL LOW (ref 39.0–52.0)
Hemoglobin: 11.2 g/dL — ABNORMAL LOW (ref 13.0–17.0)
Hemoglobin: 9.5 g/dL — ABNORMAL LOW (ref 13.0–17.0)
Potassium: 3.9 mEq/L (ref 3.5–5.1)
Potassium: 4 mEq/L (ref 3.5–5.1)
Potassium: 4.1 mEq/L (ref 3.5–5.1)
Sodium: 136 mEq/L (ref 135–145)
Sodium: 137 mEq/L (ref 135–145)

## 2011-02-20 LAB — URINE MICROSCOPIC-ADD ON

## 2011-02-20 LAB — POCT I-STAT 3, VENOUS BLOOD GAS (G3P V)
Acid-base deficit: 3 mmol/L — ABNORMAL HIGH (ref 0.0–2.0)
O2 Saturation: 62 %
TCO2: 25 mmol/L (ref 0–100)
pCO2, Ven: 47.2 mmHg (ref 45.0–50.0)

## 2011-02-20 LAB — APTT
aPTT: 34 seconds (ref 24–37)
aPTT: 43 seconds — ABNORMAL HIGH (ref 24–37)

## 2011-02-20 LAB — URINE CULTURE: Colony Count: 35000

## 2011-02-20 LAB — LIPID PANEL
LDL Cholesterol: 87 mg/dL (ref 0–99)
Triglycerides: 86 mg/dL (ref <150)
VLDL: 17 mg/dL (ref 0–40)

## 2011-02-20 LAB — HEPARIN LEVEL (UNFRACTIONATED)
Heparin Unfractionated: 0.2 IU/mL — ABNORMAL LOW (ref 0.30–0.70)
Heparin Unfractionated: 0.36 IU/mL (ref 0.30–0.70)
Heparin Unfractionated: 0.4 IU/mL (ref 0.30–0.70)
Heparin Unfractionated: 0.4 IU/mL (ref 0.30–0.70)
Heparin Unfractionated: <0.1 IU/mL — ABNORMAL LOW (ref 0.30–0.70)

## 2011-02-20 LAB — CREATININE, SERUM
Creatinine, Ser: 1.15 mg/dL (ref 0.4–1.5)
Creatinine, Ser: 1.27 mg/dL (ref 0.4–1.5)
GFR calc Af Amer: >60 mL/min (ref >60)
GFR calc Af Amer: >60 mL/min (ref >60)
GFR calc non Af Amer: 54 mL/min — ABNORMAL LOW (ref >60)
GFR calc non Af Amer: >60 mL/min (ref >60)

## 2011-02-20 LAB — PLATELET COUNT: Platelets: 94 10*3/uL — ABNORMAL LOW (ref 150–400)

## 2011-02-20 LAB — BRAIN NATRIURETIC PEPTIDE: Pro B Natriuretic peptide (BNP): 165 pg/mL — ABNORMAL HIGH (ref 0.0–100.0)

## 2011-02-21 LAB — PROTIME-INR
INR: 1.7 — ABNORMAL HIGH (ref 0.00–1.49)
Prothrombin Time: 20.8 seconds — ABNORMAL HIGH (ref 11.6–15.2)

## 2011-02-21 LAB — GLUCOSE, CAPILLARY: Glucose-Capillary: 106 mg/dL — ABNORMAL HIGH (ref 70–99)
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
## 2011-03-21 NOTE — Cardiovascular Report (Signed)
NAME:  [name] [name], [name] [name]              ACCOUNT NO.:  [id number]   MEDICAL RECORD NO.:  [id number]          PATIENT TYPE:  INP   LOCATION:  2504                         FACILITY:  [facility]   PHYSICIAN:  Richard A. Alanda Amass, M.D.DATE OF BIRTH:  1924-12-16   DATE OF PROCEDURE:  11/23/2008  DATE OF DISCHARGE:                            CARDIAC CATHETERIZATION   PROCEDURES:  1. Retrograde central aortic catheterization.  2. Right heart catheterization.  3. Retrograde left heart catheterization.  4. Thermodilution cardiac output.  5. Simultaneous arterio-venous oxygen difference.  6. Left ventricular angiogram in right anterior oblique projection.  7. Aortic root angiogram in left anterior oblique projection.  8. Subselective right internal mammary artery, left internal mammary      artery.  9. Abdominal angiogram in midstream posteroanterior projection.  10.Selective coronary angiography via Judkins technique.   PROCEDURE:  The patient was admitted as a same-day admission.  Coumadin  had been held, INR was normal, and laboratory was stable.  Informed  consent was obtained to proceed with diagnostic study from the patient  and the family.  I discussed the case with his GI doctor, Dr. Bosie Clos  preoperatively and I felt that he was too high risk for outpatient  colonoscopy and that we would re-assess this after doing diagnostic  study for his known severe aortic valvular disease.  The patient has not  had any recurrent rectal bleeding.  He has been symptomatic with  clinical and echo demonstrated severe calcific aortic valvular stenosis.  He has not had angina, but he has had exertional fatigue and dyspnea as  his major symptoms.  [name] [name] has also had a DVT of the right calf  noted in August 2009, which has been treated with Coumadin therapy.  On  followup Dopplers, there has been resolution with no acute DVT and his  Coumadin was held for his diagnostic catheterization.  He does have  chronic mild swelling of his right lower extremity, and he has had  resolution of the prior right PT and peroneal vein DVT with chronic  changes in the gastroc veins and venous reflux noted.  While on  Coumadin, he had an episode of rectal bleeding and he was seen by Dr.  Bosie Clos who had considered colonoscopy as part of his workup, which was  initially scheduled as an outpatient, but we felt that his AS was too  severe for outpatient colonoscopy, so this has been put off.  The  patient has not had any recurrent rectal bleeding.  [name] [name] is an 77-  year-old married Afro American father of 20 with 10 grandchildren.  He  stopped smoking 40 years ago.  He has exogenous obesity.  He is hard-of-  hearing, systemic hypertension, and hyperlipidemia.   The patient was brought to second floor at [facility] in a postabsorptive  state after informed consent was obtained and premedication with 5 mg  Valium p.o.  He was not given any sedation during the procedure, and 1%  Xylocaine was used for local anesthesia.  The right groin was prepped  and draped in the usual manner.  CRFA and CRFV  were both entered with  single anterior puncture using an 18 thin-wall needle and a 6-French  short arterial sheath and an 8-French short venous sheaths were placed  by modified Seldinger technique without difficulty.  Right heart  catheterization was done with a triple-lumen flow-directed Swan-Ganz  catheter.  Left heart catheterization was done via retrograde approach  with an A2 6 French catheter and a straight guide wire to cross the  aortic valve.  Simultaneous LV PCW pressures were obtained along with  pullback to the PA.  Thermodilution CO/CI was obtained along with  simultaneous A-V O2 differences.  Right heart pullback pressures were  obtained.  Simultaneous LV FA and pressures were obtained.  LV angiogram  was done through the multipurpose catheter in the RAO projection at 25  mL, 12 mL per second.  Pullback  pressure of CA was performed with high-  speed recording.  His FA was similar to his almost superimposable to his  CA pressures.  Pullback pressure was used for gradient measurement and  estimated valve area.  The catheter was changed for a pigtail catheter,  and aortic root angiography was done in the LAO projection at 25 mL, 20  mL per second.  The catheter was pulled down above the level of the  renal arteries and abdominal aortic angiogram because of his tortuous  aorta, systemic hypertension, was done in the midstream PA projection at  25 mL, 20 mL per second with visualization to the mid iliac area  bilaterally.  Prior to LV angiogram, selective coronary angiography was  done with preformed 6-French Cordis coronary catheters.  Catheters  removed.  Sidearm sheath was flushed.  The patient was tolerated the  procedure well and was transferred to the holding area for sheath  removal and pressure hemostasis in stable condition.   PRESSURES:  RA:  4/8; mean 4 mmHg.  RV:  35/3; RV EDP 6 mmHg.  PA:  40/18; mean 30 mmHg.  PCW:  31/27; mean 21 mmHg.  LV:  196/4; LV EDP 28 mmHg.  CA:  124/67; mean 88 mmHg.   SVR equals 969 DSE to the minus fifth; TSVR equals 1017; TPVR equals  251; PVR equals 120 DSE to the minus fifth.   There was a 45 mm mean gradient across the aortic valve on catheter  pullback and a 60-70 mm peak-to-peak gradient.  Estimated aortic valve  area was less than 0.9 sq. cm.   Thermodilution CO/CI was 6.55/2.89 L/min/sq. M.   Estimated Fick CO/CI at the 6.68/2.94.   The aortoiliac system was quite tortuous.   Abdominal angiogram in the midstream PA projection revealed an ectatic  abdominal aorta from the suprarenal to the infrarenal position down to  the iliacs and there was at least a small-to-moderate-sized saccular  distal aortic aneurysm above the iliac bifurcation, which was widely  patent.  The iliacs were tortuous with no significant stenosis.  The  renal  arteries were single and normal bilaterally.  SMA, celiac, and IMA  were intact.   LV angiogram in the RAO projection demonstrated vigorously contracting  LV with moderately severe-severe LVH and an EF of greater than 60% with  no wall motion abnormalities.   Fluoroscopy revealed 2+ mitral annular calcification, 2 to 3+ aortic  valve calcification.   There was no mitral regurgitation present.   Aortic root injection showed a moderately ectatic aortic root.  No  aneurysm or dissection and no significant AI was present-trace.   The brachiocephalic was tortuous.  The RIMA and LIMA were both patent  and both vertebrals were antegrade.   CORONARY ANGIOGRAPHY:  Distal left main had approximately 50% eccentric  narrowing in its worse projection.   The LAD across the apex of the heart where it bifurcated had 30%  proximal and 30% proximal-mid narrowing.  No stenosis of the first  diagonal after SP-1 and the LAD bifurcated at the apex.   The large trifurcating optional diagonal had 75% segmental stenosis in  its proximal third.   The nondominant moderate-sized circumflex had 85% stenosis in the  proximal portion after a small OM-1 before a large OM-2 and AV groove  circumflex.   The right coronary was a dominant vessel.  There was catheter damping at  the ostium probably about 40% narrowing.  There was 30-40% narrowing in  the mid vessel, 40% beyond the margin, 40-50% before the PDA and PLA.  There was 60% narrowing of the proximal bifurcating PDA and no  significant narrowing of the PLA.   Catheterization reveals approximately 50% distal left main stenosis,  high-grade large OD, and moderate-sized circumflex stenosis, noncritical  dominant right and severe calcific aortic valve stenosis.   Recent echo on October 06, 2008, showed a CW velocity of 4.8 meters per  second corresponding a mean gradient of 60 mmHg.   The patient is a candidate for CABG and aortic valve replacement.    He has not had any recurrent rectal bleeding and will eventually need a  colonoscopy, although in discussion with Dr. Bosie Clos over the phone  today because of his severe AS, he would prefer not to do colonoscopy at  present, which I would agree with.  Since he has not had any recurrent  rectal bleeding off Coumadin and historically this may have been  hemorrhoidal bleeding.  The patient will get a dental consult  preoperatively and we will ask cardiovascular surgeons to see him.  In  the interim, we would start him on heparin because of his past DVT.   CATHETERIZATION DIAGNOSES:  1. Severe symptomatic calcific aortic valve stenosis.  2. Multivessel coronary artery disease as outlined above.  3. Normal left ventricular function, left ventricular hypertrophy.  4. Systemic hypertension, normal renal arteries.  5. Ectatic abdominal aorta and possible saccular distal abdominal      aortic aneurysm.  6. Exogenous obesity.  7. Hard-of-hearing.  8. Rectal bleeding, on Coumadin resolved.  9. Deep vein thrombosis in August 2009, resolved on followup Dopplers      with chronic venous insufficiency, right      lower extremity.  10.Dental caries to be evaluated.  11.Hyperlipidemia, under therapy.      Richard A. Alanda Amass, M.D.  Electronically Signed     RAW/MEDQ  D:  11/23/2008  T:  11/24/2008  Job:  [id number]   cc:   Merlene Laughter. Renae Gloss, M.D.  [name], MD  Sheliah Plane, MD  Lindaann Slough, M.D.

## 2011-03-21 NOTE — Op Note (Signed)
NAME:  [name] [name], [name] [name]              ACCOUNT NO.:  [id number]   MEDICAL RECORD NO.:  [id number]          PATIENT TYPE:  INP   LOCATION:                               FACILITY:  [facility]   PHYSICIAN:  Burna Forts, M.D.DATE OF BIRTH:  [birth year]   DATE OF PROCEDURE:  DATE OF DISCHARGE:                               OPERATIVE REPORT   INDICATIONS FOR PROCEDURE:  Mr. Jeanbaptiste is an 77 year old gentleman,  patient of Dr. Susa Griffins who presents today for coronary artery  bypass grafting and aortic valve replacement to be performed by Dr.  Kathlee Nations Trigt.  After line placement, he is taken to the OR for  routine induction of general anesthesia after which, the TEE probe is  lubricated, protected, and passed oropharyngeally into the stomach then  slightly withdrawn for imaging of the cardiac structures.   PRECARDIOPULMONARY BYPASS TEE EXAMINATION:  Left Ventricle:  The left  ventricular chamber is seen initially in a short axis view.  It reveals  significant and severe left ventricular hypertrophy that is concentric.  There is satisfactory overall contractility noted in the short axis  view; however, there is slight hypokinesis or diminution of  contractility noted in the septal and inferior walls of the ventricle  seen in this short axis view.  Papillary muscles are well outlined.  There are no other masses within.   Mitral valve:  This is a somewhat thickened mitral valve apparatus.  Both anterior and posterior leaflets are seen easily.  They appear to  open satisfactorily during diastolic inflow.  There is satisfactory  coaptation seen in the 4-chamber view.  Doppler interrogation revealed  trace mitral regurgitant flow.   Left atrium is a mildly enlarged left atrial chamber with some slight  bowing of the inner atrial septum, left to right.  There are no masses  noted within.   Aortic Valve:  The aortic valve is seen initially in a short axis view.  There is such having  deposition of calcium that the edges in any normal  anatomy of the leaflet is obscured.  There appears to be severe and  heavy calcium deposition with severe restriction of motion in this  aortic leaflets.  Color Doppler revealed 3-4 plus turbulent jet through  this narrowed orifices seen in the long-axis view.  Again, also in the  left ventricular outflow tract is a small aortic regurgitant jet, which  would be considered by [place].  Hemodynamic parameters and measurements are  conducted, which revealed by calculation in aortic valve area of around  0.7 sq .cm.  Gradients are obtained with a peak gradient of around 78-79  mmHg and mean gradient of 39-40 mmHg.   The right ventricle tricuspid valve and right atrium are essentially  normal structures.   The patient is placed on cardiopulmonary bypass.  Hypothermia is begun.  Coronary artery bypass grafting is carried out followed by aortotomy and  replacement with a #23 pericardial tissue valve.  De-airing maneuvers  were carried out and the patient is separated from coronary artery  bypass with the initial attempt.   POSTCARDIOPULMONARY BYPASS TEE  EXAMINATION:  Left Ventricle:  Again, the  left ventricular chamber seen in the short axis view.  There is good to  excellent overall contractility noted especially the anterior,  anterolateral, and lateral wall areas.  The inferior and septal wall  areas remains somewhat hypocontractile, but overall there is good left  ventricular function appreciated in this short axis view.  Multiple  views are obtained.   Aortic Valve:  In place of the diseased and calcified valve can now be  seen the struts of and the very thin leaflet edges of this pericardial  tissue valve.  The valve itself appears to be seated satisfactorily and  appropriately.  Multiple images are obtained including the short axis  and long-axis views, which again show normal and satisfactory  functioning of the replaced valve.  There  does not appear to be any  obstruction to flow.  There is no regurgitant flow or perivalvular leaks  appreciated.  This is considered to be satisfactory repair and  replacement.   The rest of the cardiac examination was as previously described.  The  patient is returned to the Cardiac Intensive Care Unit in stable  condition.           ______________________________  Burna Forts, M.D.     JTM/MEDQ  D:  11/30/2008  T:  12/01/2008  Job:  [id number]

## 2011-03-21 NOTE — Op Note (Signed)
NAME:  [name] [name], [name] [name]              ACCOUNT NO.:  [id number]   MEDICAL RECORD NO.:  [id number]          PATIENT TYPE:  INP   LOCATION:  2304                         FACILITY:  [facility]   PHYSICIAN:  Kerin Perna, M.D.  DATE OF BIRTH:  Mar 04, 1925   DATE OF PROCEDURE:  11/30/2008  DATE OF DISCHARGE:                               OPERATIVE REPORT   OPERATION:  1. Aortic valve replacement using a 23-mm pericardial Magna tissue      valve, model 3000, serial number [id number].  2. Coronary artery bypass grafting x3 (saphenous vein graft to      posterior descending, saphenous vein graft to ramus intermediate,      saphenous vein graft to distal left anterior descending).  3. Endoscopic harvest of left thigh greater saphenous vein.   PREOPERATIVE DIAGNOSIS:  Severe critical aortic stenosis with three-  vessel coronary disease.   POSTOPERATIVE DIAGNOSIS:  Severe critical aortic stenosis with three-  vessel coronary disease.   SURGEON:  Kerin Perna, MD   ASSISTANT:  Salvatore Decent. Dorris Fetch, MD and Rowe Clack, PA-C   ANESTHESIA:  General by Dr. Jacklynn Bue.   INDICATIONS:  The patient is an 77 year old gentleman with a murmur of  aortic stenosis and recent episode of syncope.  A 2-D echo showed  significant aortic stenosis with a calculated area of 0.07.  The patient  had a history of DVT and was on Coumadin.  After his INR was normalized,  he underwent a left and right heart cath.  This confirmed the diagnosis  of severe critical aortic stenosis.  He also had a 50% left main  stenosis, 75% stenosis in the ramus intermediate, moderate disease of  the distal circumflex, and 60% stenosis of the proximal posterior  descending branches of right coronary.  His EF was normal and he had a  hypertrophied heart.  It was felt that the patient would benefit from  aortic valve replacement and multivessel bypass grafting for treatment  of his severe aortic stenosis and three-vessel coronary artery  disease.   Prior to surgery, I examined the patient in his hospital room and  reviewed results of cardiac cath and echo with the patient and his  family.  I discussed the indications and expected benefits of  multivessel coronary artery bypass surgery and aortic valve replacement  for treatment of his aortic stenosis and multivessel coronary artery  disease.  I reviewed the major issues of this surgery including the  choice of a pericardial valve due to his advanced age even though he was  on Coumadin at this time.  I also reviewed the location of the surgical  incisions, use of general anesthesia, and cardiopulmonary bypass, and  expected postoperative hospital recovery.  I discussed with the patient  the risk to him of coronary artery bypass surgery including the risks of  MI, stroke, bleeding, blood transfusion requirement, infection, and  death.  After reviewing these issues, he demonstrated his understanding  and agreed to proceed with the surgery under what I felt was an informed  consent.   OPERATIVE FINDINGS:  1. Severe critical aortic  stenosis with heavily calcified valve      treated effectively with a 23-mm Magna pericardial valve      replacement.  2. Severe calcification of the ascending aorta requiring cannulation      of the arch and careful application of the crossclamp.  3. Segmental vein and disease with some scarring and even      calcification of the greater saphenous vein requiring full harvest      of the entire left leg.  The right leg had no usable vein due to      recent DVT and scarring.  4. Intraoperative anemia requiring transfusion of 3 units of packed      cells and a unit of platelets following reversal of heparin with      protamine due to persistent coagulopathy.   PROCEDURE:  The patient was brought to operative room, placed supine on  the operating table where general anesthesia was induced.  The chest,  abdomen, and legs were prepped with Betadine  and draped as a sterile  field.  A sternal incision was made as the saphenous vein was harvested  endoscopically from the left leg.  The sternum was divided with the  sternal saw.  The patient had extremely difficult exposure of the  mammary artery to a short obese stature and very stiff thick thorax and  the retractor bars would not safely visualize the mammary artery, which  was extremely lateral in location as well as scarred into the chest  wall.  An attempted harvesting the mammary artery resulted in bleeding  and my plan at that time was to proceed with vein graft only for his  bypasses.   The sternal retractor was then placed and the pericardium was opened and  suspended.  The vein unfortunately was scarred and took considerable  time to harvest both endoscopically and open technique was used in the  lower leg.  Three adequate segments of vein were prepared, but one  required a vein-to-vein anastomosis, which was used for the right  coronary graft.  Heparin was then administered and pursestring placed in  the aortic arch and right atrium and the patient was cannulated and  placed on bypass.  A left ventricular vent was placed via the right  superior pulmonary vein and cardioplegia catheters were placed for both  the antegrade and retrograde cold blood cardioplegia.  The patient was  cooled to 30 degrees and aortic crossclamp was carefully applied with  the head and the deep Trendelenburg position.  One liter of cold blood  cardioplegia was delivered and split doses between the antegrade aortic  retrograde coronary sinus catheters and there is a good cardioplegic  arrest with septal temperature dropped to less than 14 degrees.  Cardioplegia was then delivered every 20 minutes or less while the  crossclamp was in place.   The coronary anastomoses were performed first.  The first distal  anastomosis was to the posterior descending.  This was heavily diseased  and calcified with the  lumen measuring 1.0 to 1.5-mm.  Reverse saphenous  vein was sewn end-to-side with running 7-0 Prolene with good flow  through the graft.  This vein graft composed of two segments of vein  which were anastomosed to create one conduit.  The second distal  anastomosis was to the ramus intermediate branch of the circumflex.  This was a 1.5-mm vessel and a 75-80% stenosis.  The reverse saphenous  vein was sewn end-to-side with running 7-0 Prolene with good flow  through the graft.  The third distal anastomosis was to the distal LAD.  It was intramyocardial more proximally.  It was a 1.5-mm vessel and a  proximal 50-60% stenosis.  A reverse saphenous vein sewn end-to-side  with running 7-0 Prolene with good flow through the graft.  Cardioplegia  was redosed.   Attention was then directed to the aortic valve.  The aorta had a very  significant calcified plaque anteriorly across the diagonal aspect of  the anterior aortic wall going from the area of the right atrium towards  the arch.  This area was completely avoided with the crossclamp and the  aortotomy and the proximal anastomosis.  The aortotomy was performed and  the aortic valve was examined.  It had three leaflets, which were all  heavily calcified and immobile.  The valve was excised.  The calcium  debrided from the annulus.  The outflow tract was irrigated with copious  amounts of cold saline.  The annulus was sized to a 23-mm Magna  pericardial valve.  Subannular 2-0 pledgeted Ethibond sutures were then  placed around the annulus measuring 17 total.  These were then placed  through the sewing ring of the valve which had been prepared according  to protocol and the valve was seated and the sutures were tied.  There  was good apposition of the valve to the annulus without space.  There  was no obstruction of the coronary ostia.  The valve was then again  rinsed and irrigated and the aortotomy was closed in 2 layers using a 4-  0  Prolene.  Cardioplegia was redosed.   While the crossclamp was still in place, three proximal vein anastomoses  were placed on the ascending aorta and the anterior aspect with care  being to avoid involving the heavily calcified band in the aorta with  the proximal anastomoses.  Three anastomoses using running 7-0 Prolene  were performed and prior to tying down the final proximal anastomosis,  air was vented from the coronaries with a dose of retrograde warm blood  cardioplegia.   The crossclamp was then removed.  Air was aspirated from the vein grafts  and the cardioplegia catheters were removed.  The heart was cardioverted  back to a regular rhythm.  A new vent was placed in the ascending aorta  to remove any residual air.   Proximal and distal anastomoses were checked and found to be hemostatic.  Temporary pacing wires were applied.  The patient was rewarmed to 37  degrees.  The lungs were expanded.  The ventilator was resumed.  The  patient was weaned from bypass without difficulty on low-dose dopamine.  Blood pressure and cardiac output were stable.  The transesophageal echo  showed good functioning of the prosthetic valve with well preserved LV  systolic function very thick ventricle.  Protamine was administered  without adverse reaction.  There was still considerable coagulopathy.  The patient received FFP and platelets with improved coagulation  function.  The mediastinum was irrigated with warm saline.  The superior  pericardial fat was closed over the aorta.  Two mediastinal and a left  pleural chest tube were placed and brought out through separate  incisions.  The sternum was closed with interrupted steel wire.  The  pectoralis fascia was closed with a running #1 Vicryl.  Subcutaneous and  skin layers were closed in running Vicryl and sterile dressings were  applied.  Aortic crossclamp time was 130 minutes.      [name] Arista  Donata [name], M.D.  Electronically Signed      PV/MEDQ  D:  11/30/2008  T:  11/30/2008  Job:  [id number]   cc:   Gerlene Burdock A. Alanda Amass, M.D.  [facility]

## 2011-03-21 NOTE — Op Note (Signed)
NAME:  [name] [name], [name] [name]              ACCOUNT NO.:  [id number]   MEDICAL RECORD NO.:  [id number]          PATIENT TYPE:  AMB   LOCATION:  ENDO                         FACILITY:  [facility]   PHYSICIAN:  [name], MDDATE OF BIRTH:  15-May-1925   DATE OF PROCEDURE:  03/01/2009  DATE OF DISCHARGE:                               OPERATIVE REPORT   INDICATIONS:  Screening.   MEDICATIONS:  Fentanyl 50 mcg IV, Versed 5 mg IV.   FINDINGS:  Rectal exam was normal.   A pediatric Pentax colonoscope was inserted through a well prepped colon  and advanced to the cecum, where ileocecal valve and appendiceal orifice  were identified.  In order to reach the cecum, the patient had to be  turned in the supine position and abdominal pressure had to be applied  due to excessive looping.  In the cecum was a 1-cm sessile polyp that  was removed with snare cautery.  In the proximal ascending colon was a 2-  cm sessile polyp removed with snare cautery.  On further withdrawal, 3  polyps were noted in the hepatic flexure and these were removed with  sessile cautery.  One of the polypectomy sites caused oozing of blood,  which required 2 mL of epinephrine 1:10,000 injection.  On further  withdrawal, 3 subcentimeter sessile polyps were seen in the transverse  colon, which were removed with snare cautery.  In the splenic flexure,  there were 3 polyps approximately 4 mm to 8 mm in size and these were  removed with snare cautery.  In the descending colon was a flat polyp  that was attempted to be removed in piecemeal and then decision was made  to inject saline to raise the polyp up, which was successful.  The polyp  was then removed in a piecemeal fashion.  Due to oozing at the  polypectomy site, epinephrine 1 mL was injected into the base with good  bleb and blanching seen.  A 2-mm sessile polyp was attempted to be  removed with snare cautery, but was removed prior to application of  cauterization.   Epinephrine 1 mL was injected to achieve hemostasis.  Retroflexion revealed small telangiectasias in the distal rectum and  small internal hemorrhoids.   ASSESSMENT:  1. Multiple colonic polyps.  2. Nodular ileocecal valve, status post biopsy.  3. Internal hemorrhoids.  4. 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   PLAN:  1. Follow up on path.  2. Hold NSAIDs x2 weeks.  3. Hold Coumadin for another 7 days (discussed this with Dr.      Alanda Amass, who was okay with holding it another 7 days).      [name], MD  Electronically Signed     VCS/MEDQ  D:  03/01/2009  T:  03/01/2009  Job:  [id number]   cc:   Merlene Laughter. Renae Gloss, M.D.  Richard A. Alanda Amass, M.D.  Lindaann Slough, M.D.

## 2011-03-21 NOTE — Consult Note (Signed)
NEW PATIENT CONSULTATION   [name] [name], [name] [name]  DOB:  05-05-1925                                       12/21/2009  ZOXWR#:[id number]   The patient is an 77 year old male patient referred by Dr. 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  The  patient has a history of prostate cancer treated with implanted seeds.  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  The renal artery  aneurysm appears to be calcified.  He had no previous knowledge of  these.  He has had no abdominal or back symptoms and no history of renal  insufficiency.   STABLE MEDICAL PROBLEMS:  1. Hyperlipidemia.  2. Hypertension.  3. Prostate cancer.  4. History of DVT right leg and pulmonary embolus.  5. Adult sleep apnea.  6. Negative for COPD or stroke.   FAMILY HISTORY:  Positive for coronary artery disease in his mother,  negative for diabetes and stroke.   SOCIAL HISTORY:  He is married, is retired.  He quit smoking 4 years  ago.  Does not use alcohol.   REVIEW OF SYSTEMS:  Weight is 242 pounds.  Does have occasional chest  discomfort.  Has reflux esophagitis, occasional heartburn, urinary  frequency, discomfort in his legs with walking, history of DVT in the  right leg with chronic swelling, headaches, dizziness, joint pain,  depression.  All other systems in the review of systems are negative.   PHYSICAL EXAMINATION:  Vital signs:  Blood pressure 125/76, heart rate  71, respirations 14, temperature 98.4.  General:  He is an elderly obese  male patient who is in no apparent distress.  Alert and oriented x3.  He  is well-developed and well-nourished.  HEENT:  Exam  reveals EOMs intact.  Conjunctivae normal.  Chest:  Clear to auscultation.  Cardiovascular:  Regular rhythm.  No murmurs.  Neck:  Neck is supple with 3+ carotid  pulses.  No bruits audible.  Neurological:  Normal.  Abdomen:  Obese.  No palpable masses.  No bruits heard.  Musculoskeletal:  Free of any  major deformities.  Skin:  Free of rashes.  He does have bulging  varicosities in his right distal thigh and proximal medial calf with 2  cm enlargement in the right leg compared to the left.   I reviewed the report of the CT scan performed at [facility] and  also the records provided by Dr. Brunilda Payor and the laboratory values.  Creatinine is 1.5, BUN is 19.  He does have an infrarenal aortic  aneurysm and bilateral calcified renal artery aneurysms will need to be  followed but do not need treatment at this time.  We will see him back  in 1 year with a CT angiogram to more fully evaluate these and see if  there has been any enlargement in the interim.     Quita Skye Hart [place], M.D.  Electronically Signed   JDL/MEDQ  D:  12/21/2009  T:  12/22/2009  Job:  [id number]   cc:  Merlene Laughter. Renae Gloss, M.D.  Lindaann Slough, M.D.

## 2011-03-21 NOTE — Discharge Summary (Signed)
NAME:  [name] [name], [name] [name]              ACCOUNT NO.:  [id number]   MEDICAL RECORD NO.:  [id number]          PATIENT TYPE:  INP   LOCATION:  2023                         FACILITY:  [facility]   PHYSICIAN:  Richard A. Alanda Amass, M.D.DATE OF BIRTH:  12/09/1924   DATE OF ADMISSION:  04/16/2008  DATE OF DISCHARGE:                               DISCHARGE SUMMARY   DISCHARGE DIAGNOSES:  1. Deep venous thrombosis of the right lower extremity, percutaneously      inserted central line catheter stent.  Right gastrocnemius at the      junction of popliteal extending to the right posterior tibialis and      right posterior peroneal veins.  2. Critical severe aortic stenosis.  3. Peripheral vascular disease with small abdominal aortic aneurysm,      stable.  4. Hypertension.  5. Obstructive sleep apnea.  The patient is on CPAP.  6. Hyperlipidemia.  7. Obesity.   This is an 77 year old African American gentleman, patient of Dr.  Alanda Amass, who presented to our office for a discussion with Dr.  Alanda Amass prior to scheduling coronary angiogram.  The patient has a  known history of aortic stenosis that overtime progressed to a critical  level now, but he remains asymptomatic, physically active.  Due to a  high risk of cardiac death or development of congestive heart failure,  he would need to undergo aortic valve surgery and prior to that needs  angiography.   During this office visit, the patient complained of the pain in the  right upper extremity and in swelling of the right leg.  We ordered  venous Dopplers in the office that revealed DVT and patient was admitted  for anticoagulation therapy.  He remained asymptomatic from a cardiac  standpoint.  His blood pressure was stable, so we started him  immediately on heparin and Coumadin and he was in the hospital up until  the INR becomes therapeutic.  On April 21, 2008, Dr. Allyson Sabal saw the  patient and felt he would be stable for discharged to home.   LABORATORY DATA:  His hospital laboratories, hemoglobin 15.4, hematocrit  48.5, white blood cell count 8.6, and platelets 177.  Sodium 137,  potassium 4.7, chloride 103, CO2 30, BUN 18, creatinine 1.33, and  glucose 109.   INR on the day of discharge was 2.3.  TSH 1.192.  Lipid profile revealed  total cholesterol of 143, LDL 75, HDL 44, and triglycerides 045.   DISCHARGE MEDICATIONS:  1. Felodipine 10 mg daily.  2. Simvastatin 40 mg daily.  3. Toprol XL 25 mg daily.  4. Zoloft 100 mg daily.  5. Hydrochlorothiazide 25 mg daily.  6. Klor-Con 10 milliequivalents daily.  7. Aspirin 81 mg daily.  8. Coumadin 5 mg daily.   The patient will see Dr. Alanda Amass in a couple of weeks and also follow  up in [facility] for INR on Friday, April 24, 2008, and office will  call the patient to schedule this appointment.      Raymon Mutton, P.A.      Richard A. Alanda Amass, M.D.  Electronically Signed  MK/MEDQ  D:  04/21/2008  T:  04/22/2008  Job:  [id number]   cc:   [facility]  Richard A. Alanda Amass, M.D.  Merlene Laughter. Renae Gloss, M.D.
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
## 2011-03-21 NOTE — Assessment & Plan Note (Signed)
OFFICE VISIT   [name], [name]  DOB:  [birth year]                                        December 25, 2008  CHART #:  [id number]   CURRENT PROBLEMS:  1. Status post aortic valve replacement, coronary artery bypass graft      x 3 on November 30, 2008, for critical aortic stenosis with three-      vessel disease (23-mm pericardial tissue valve and vein bypass      grafts to the posterior descending, ramus intermediate, and left      anterior descending).  2. History of deep vein thrombosis of the right leg, on Coumadin.  3. Transient postoperative atrial fibrillation converting to sinus      rhythm.  4. Fluid retention with ankle edema with Lasix recently restarted.   PRESENT ILLNESS:  The patient is an 77 year old gentleman with a long  history of cardiac murmur, who was found to have critical aortic  stenosis and severe multivessel disease.  He underwent AVR, CABG x3 and  had a fairly uneventful postoperative recovery.  He was discharged home  in sinus rhythm on Coumadin and amiodarone.  He is also taking Lopressor  25 b.i.d., Lipitor 20 mg, aspirin 81 mg, Norvasc 10 mg, Zoloft, and  hydrochlorothiazide.  He denies any incisional pain.  The surgical  incisions are healing well.  He has some recent fluid retention and  weight gain for which he was started back on Lasix and potassium.  He  has had no bleeding complications from the Coumadin and home health  nurse has been following.  He has not been contacted yet by the  outpatient cardiac rehab program at [facility].   PHYSICAL EXAMINATION:  VITAL SIGNS:  Blood pressure 115/70, pulse 80 and  regular, respirations 18, and saturation 95%.  GENERAL:  He is alert and pleasant, but hard of hearing.  LUNGS:  Breath sounds are clear, but slightly diminished at the right  base.  CHEST:  Sternal incision is well healed.  CARDIAC:  Rhythm is regular without S3 gallop or murmur.  EXTREMITIES:  His leg incisions  are healing well (left leg) and he has  bilateral ankle edema.   PA and lateral chest x-ray reveals some atelectasis and a probable small  effusion at the right lung base.  The sternal wires are intact and the  cardiac silhouette is stable.   PLAN:  The patient will continue his Lasix and potassium.  We will have  the cardiac rehab program contact the patient to begin phase II rehab.  I have asked him to decrease his amiodarone to once a day.  His Coumadin  has been managed by his physician, Dr. Chestine Spore and by his cardiologist Dr.  Alanda Amass.  Hopefully that can be stopped later this spring.  Otherwise,  he is doing well and should be able to start driving and doing routine  activities in the next 1-2 weeks.  I will plan on seeing him back with a  chest x-ray in 3 weeks to monitor the small right pleural effusion.   Kerin Perna, M.D.  Electronically Signed   PV/MEDQ  D:  12/25/2008  T:  12/26/2008  Job:  [id number]   cc:   Gerlene Burdock A. Alanda Amass, M.D.  [facility]  Margaretmary Bayley, M.D.
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
## 2011-03-21 NOTE — Assessment & Plan Note (Signed)
OFFICE VISIT   [name], [name]  DOB:  [birth year]                                        January 15, 2009  CHART #:  [id number]   HISTORY:  The patient is seen today in followup status post his aortic  valve replacement and coronary artery bypass grafting x3 on 11/30/2008.  He was last seen in the office on December 25, 2008, at which time he  had some evidence of a small right-sided effusion on his chest x-ray.  At that time, he was given some additional Lasix and he returns on  today's date for followup.   On chest x-ray today's date, the right-sided effusion appears stable in  appearance.   PHYSICAL EXAMINATION:  VITAL SIGNS:  Blood pressure 115/60, pulse is 78,  respirations 18, and oxygen saturation 93% on room air.  GENERAL:  An elderly, black male in no acute distress.  PULMONARY:  Clear breath sounds throughout.  CARDIAC:  A 2/6 systolic aortic flow murmur.  EXTREMITIES:  Mild edema in the lower legs and ankles.  Incisions are  all well healed without evidence of infection.   ASSESSMENT:  The patient continues to make ongoing progress in his  recovery from open heart surgery.  This small right effusion is  asymptomatic at the current time.  At this time, he has no ongoing  surgical issues that require followup.  We will turn him over to the  care of his cardiologist and primary physician.  We will be happy to see  him at any time regarding any future surgical needs.   Rowe Clack, P.A.-C.   Sherryll Burger  D:  01/15/2009  T:  01/15/2009  Job:  [id number]   cc:   Gerlene Burdock A. Alanda Amass, M.D.  Merlene Laughter. Renae Gloss, M.D.

## 2011-03-21 NOTE — Op Note (Signed)
NAME:  HAYES, CZAJA              ACCOUNT NO.:  [id number]   MEDICAL RECORD NO.:  [id number]          PATIENT TYPE:  INP   LOCATION:  2027                         FACILITY:  [facility]   PHYSICIAN:  Charlynne Pander, D.D.S.DATE OF BIRTH:  1925/09/10   DATE OF PROCEDURE:  11/25/2008  DATE OF DISCHARGE:                               OPERATIVE REPORT   PREOPERATIVE DIAGNOSES:  1. Aortic stenosis.  2. Coronary artery disease.  3. Preaortic valve/coronary artery bypass graft procedure, dental      protocol.  4. Chronic periodontitis.  5. Apical periodontitis.  6. Rampant dental caries.  7. Bilateral mandibular large lingual tori.   POSTOPERATIVE DIAGNOSES:  1. Aortic stenosis.  2. Coronary artery disease.  3. Preaortic valve/coronary artery bypass graft procedure, dental      protocol.  4. Chronic periodontitis.  5. Apical periodontitis.  6. Rampant dental caries.  7. Bilateral mandibular large lingual tori.   OPERATIONS:  1. Extraction of remaining teeth (#'s 20, 21, 22, 23, 24, 25, 26, 27,      28, 29)  2. Two quadrants of alveoloplasty.  3. Bilateral mandibular lingual tori reductions.   SURGEON:  Charlynne Pander, DDS   ASSISTANT:  Zettie Pho (dental assistant).   Anesthesia was monitored anesthesia care per the Anesthesia Team.   MEDICATION:  1. Ancef 1 g IV prior to invasive dental procedures.  2. Local anesthesia with total utilization of 2 carpules each      containing 34 mg of lidocaine with 0.017 mg of epinephrine as well      as 2 carpules each containing 9 mg of bupivacaine with 0.009 mg of      epinephrine.   SPECIMENS:  Ten teeth that were discarded.   Drains were none.   Cultures were none.   Estimated blood loss was 50 mL.   Fluids were 450 mL of lactated Ringer solution.   Complications were none.   INDICATIONS:  The patient recently diagnosed with severe aortic stenosis  and coronary artery disease requiring aortic valve replacement  with  coronary artery bypass graft procedure.  A dental consultation was  requested as part of a preaortic valve replacement dental protocol.  The  patient was examined and treatment planned for multiple extraction of  remaining teeth with alveoplasty and preprosthetic surgery as indicated.  This treatment plan was formulated to decrease the risk and  complications associated with dental infection from affecting the  patient's systemic health and anticipated heart valve surgery.   OPERATIVE FINDINGS:  The patient was examined in operating room #2.  The  teeth were identified for extraction.  The patient noted to be affected  by chronic periodontitis, apical periodontitis, rampant dental caries,  and large bilateral mandibular lingual tori.  The aforementioned  necessitated removal of all remaining teeth with alveoloplasty and  bilateral mandibular lingual tori reductions.  The patient is noted also  to have excessive maxillary right and left tuberosities; however, the  patient did not wish to have these reduced at this time as the upper  denture was fabricated around these tuberosities.   DESCRIPTION OF  PROCEDURE:  The patient was brought to the main operating  room #2.  The patient was then placed in supine position on the  operating room table.  Monitored anesthesia care was then induced per  the Anesthesia Team.  The patient was then prepped and draped in the  usual manner for dental medicine procedure.  A time-out was performed,  the patient was identified, and procedure was  verified.  The oral  cavity was then thoroughly examined with findings noted above.  The  patient was then ready for the dental medicine procedure as follows:   Local anesthesia was administered sequentially with a total utilization  of 2 carpules each containing 34 mg of lidocaine with 0.017 mg of  epinephrine as well as 2 carpules containing 9 mg of bupivacaine with  0.009 mg of the epinephrine.   The  mandibular left and right quadrants were first approached.  The  patient was given bilateral inferior alveolar nerve blocks utilizing the  bupivacaine with epinephrine.  Further infiltration was then achieved  utilizing the lidocaine with epinephrine.  At this point in time, the  mandibular left and right quadrants were approached.  A 15 blade  incision was then made from the distal of #18 and extended to the distal  of #31.  A surgical flap was then carefully reflected.  Appropriate  amounts of buccal and interseptal bone were removed with a surgical  handpiece and bur and copious amounts of sterile saline.  The teeth were  then subluxated with a series of straight elevators.  The coronal aspect  of tooth #20 was then removed at this time leaving the root remaining.  Tooth numbers 21, 22, 23, 24, 25, 26, 27, 28, and the coronal aspect of  tooth #29 were then removed at this time with a 151 forceps.  This left  the roots in the area of tooth numbers 20 and 29 remaining.  A surgical  handpiece and bur and copious amounts of sterile saline were then  utilized to remove further bone around these remaining roots.  These  roots were then subluxated and eventually removed with rongeurs without  further complications.  Alveoplasty was then performed utilizing  rongeurs and bone file.   At this point in time, the mandibular left and mandibular right,  mandibular tori were exposed after further flap reflection.  A surgical  handpiece and bur and copious amounts of sterile saline were utilized to  remove the extensive bilateral mandibular lingual tori.  Further  alveoloplasty was then performed utilizing rongeurs and bone file.  These  surgical sites were then irrigated with copious amounts of  sterile saline x4.  At this point in time, the individual sockets were  approached, and a piece of Surgicel was placed in each of extraction  socket appropriately.  The tissues were then approximated and  trimmed  appropriately.  The mandibular left quadrant was then closed from the  distal #18 and extended to the mesial #24 utilizing 3-0 chromic gut  suture in a continuous interrupted suture technique x1.  The mandibular  right quadrant was then closed from the distal #31 and extended to the  mesial #25 utilizing 3-0 chromic gut suture in a continuous interrupted  suture technique x1.  Three individual interrupted sutures were then  placed to further close the surgical site as indicated.  At this point  in time, the entire mouth was irrigated with copious amounts of sterile  saline.  The patient was examined for complications,  seeing none, the  dental medicine procedure was deemed to be complete.  A series of 4 x 4  gauze were then placed in the mouth to aid hemostasis.  The patient was  then handed off to the Anesthesia Team for final disposition.  After  appropriate amount of time, the patient was taken to the postanesthesia  care unit with stable vital signs and good oxygenation level.  All  counts were correct for dental medicine procedure.  The patient will be  followed appropriately with continuation of Lovenox therapy started on  November 26, 2008, in the morning if no active bleeding is present.  The  anticipated heart surgery will then proceed with Dr. Donata Clay as  indicated, most likely early next week.      Charlynne Pander, D.D.S.  Electronically Signed     RFK/MEDQ  D:  11/25/2008  T:  11/25/2008  Job:  [id number]   cc:   Kerin Perna, M.D.  Richard A. Alanda Amass, M.D.

## 2011-03-24 NOTE — Op Note (Signed)
[facility]  Patient:    [name] [name], [name] [name] Visit Number: [id number] MRN: [id number]          Service Type: NES Location: NESC Attending Physician:  Lindaann Slough Dictated by:   Lindaann Slough, M.D. Proc. Date: 03/24/02 Admit Date:  03/24/2002   CC:         Maryln Gottron, M.D.   Operative Report  PREOPERATIVE DIAGNOSIS:  Adenocarcinoma of the prostate.  POSTOPERATIVE DIAGNOSIS:  Adenocarcinoma of the prostate.  PROCEDURE:  Radioactive seeds implantation.  SURGEON:  Lindaann Slough, M.D. and Maryln Gottron, M.D.  ANESTHESIA:  General.  INDICATION:  The patient is a 77 year old male who had an elevated PSA at 6.83. A prostate biopsy was positive for adenocarcinoma, Gleason score 7. Treatment options were discussed with the patient and he chose to have radiation treatment. He was referred for treatment. The plan is to treat the patient with external beam and brachytherapy. He has already been treated with the external beam. He is now scheduled for seed implantation.  DESCRIPTION OF PROCEDURE:  Under general anesthesia, the patient was prepped and draped and placed into the dorsal lithotomy position. A Foley catheter was inserted in the bladder a rectal tube was inserted in the rectum. The transducer was then inserted in the rectum. When the images obtained with the preoperative ultrasound were identical, the transducer was fixed in place, anchor needles were placed in the prostate, and a total of 80 seeds were implanted in the prostate. Then the Foley catheter was removed. A flexible cystoscope was then inserted in the bladder. There was one seed in the bladder and the seed was on a strand of five and four seeds were removed out of the strand. So, a total of 76 seeds were inserted in the prostate.  A #16 Foley catheter was then inserted in the bladder.  The patient tolerated the procedure well and left the OR in satisfactory condition to  postanesthesia care unit. Dictated by:   Lindaann Slough, M.D. Attending Physician:  Lindaann Slough DD:  03/24/02 TD:  03/24/02 Job: [id number]

## 2011-03-24 NOTE — Op Note (Signed)
NAME:  [name] [name], [name] [name]                          ACCOUNT NO.:  [id number]   MEDICAL RECORD NO.:  [id number]                   PATIENT TYPE:  OIB   LOCATION:  2899                                 FACILITY:  [facility]   PHYSICIAN:  Salley Scarlet., M.D.         DATE OF BIRTH:  15-Jun-1925   DATE OF PROCEDURE:  12/08/2002  DATE OF DISCHARGE:                                 OPERATIVE REPORT   PREOPERATIVE DIAGNOSIS:  1. Immature cataract, left eye.  2. Macular hole, left eye.   POSTOPERATIVE DIAGNOSIS:  1. Immature cataract, left eye.  2. Macular hole, left eye.   OPERATION PERFORMED:  Kelman phacoemulsification of cataract with  intraocular lens implantation in the left eye.   SURGEON:  Nadyne Coombes, M.D.   ANESTHESIA:  Local using Xylocaine 2% with Marcaine 0.75%, [place].   INDICATIONS FOR PROCEDURE:  The patient is a 77 year old gentleman who  complaints of blurring of vision with difficulty seeing to read from his  left eye.  He was evaluated and found to have a visual acuity best corrected  to 20/75 on the right, approximately 20/70 to 20/60 on the left.  He was  found to have bilateral posterior subcapsular cataract with what was felt to  be a macular hole in the left eye.  He was referred to Dr. Luciana Axe for  evaluation, who confirmed that he does have a retinal hole.  Dr. Luciana Axe  requests that these cataracts be removed from the left eye so that he could  get a better view when he does surgery to repair the macular hole of the  left eye.  The situation was carefully explained to the patient.  He was  therefore admitted now for cataract extraction of the left eye.   DESCRIPTION OF PROCEDURE:  Under the influence of IV sedation, a Van Lint  akinesia and retrobulbar anesthesia was given.  The patient was prepped and  draped in the usual manner.  The lid speculum was inserted under the upper  and lower lid of the left eye and 4-0 silk traction suture was passed  through  the belly of the rectus muscles for traction.  A fornix based  conjunctival flap was turned and hemostasis achieved by using cautery.  An  incision was then made in the sclera at the limbus using a Beaver blade.  This incision was dissected down clear cornea using crescent blade.  A side  port incision was made at the 1:30 o'clock position.  OcuCoat was injected  into the eye through the side port incision.  The anterior chamber was then  entered through the corneoscleral tunnel incision at the 11:30 o'clock  position.  An anterior capsulotomy was done using a bent 25 gauge needle.  The nucleus was hydrodissected using Xylocaine.  The KPE hand piece was  fashioned into the eye and the nucleus was emulsified without difficulty.  The residual cortical material was aspirated.  The  posterior capsule was  polished using an olive tip polisher.  The wound was widened slightly to  accommodate an oval 5 x 6 TMMA intraocular lens.  This lens was seated into  the capsular back without difficulty.  The anterior chamber was reformed and  the pupil was constricted using Miochol.  The residual OcuCoat was aspirated  from the eye.  The corneoscleral wound was closed using a single horizontal  suture of 10-0 nylon.  After ascertaining that the wound was airtight and  watertight, the conjunctiva was closed using thermal cautery.  1 cc of  Celestone and 0.5 cc of gentamicin was injected subconjunctivally.  Maxitrol  ophthalmic ointment and Pilopine ointment were applied along with a patch  and Fox shield.  The patient tolerated the procedure well and was discharged  to the post anesthesia care unit in satisfactory condition.  He was  instructed to rest today, to take Vicodin every four hours as needed for  pain and to see me in the office tomorrow for further evaluation.   DISCHARGE DIAGNOSIS:  Immature cataract, left eye.                                                Salley Scarlet., M.D.     TB/MEDQ  D:  12/08/2002  T:  12/08/2002  Job:  [id number]
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
## 2011-03-24 NOTE — Discharge Summary (Signed)
NAME:  [name] [name], [name] [name] NO.:  [id number]   MEDICAL RECORD NO.:  [id number]          PATIENT TYPE:  INP   LOCATION:  2004                         FACILITY:  [facility]   PHYSICIAN:  Kerin Perna, M.D.  DATE OF BIRTH:  03-31-1925   DATE OF ADMISSION:  11/23/2008  DATE OF DISCHARGE:  12/07/2008                               DISCHARGE SUMMARY   ADDENDUM   Mr. Mcfarren was anticipated to go home on December 06, 2008.  However, he  remained weak and deconditioned.  He had an additional episode of what  appeared to be atrial fibrillation with controlled rate.  An EKG was  performed which confirmed sinus rhythm with frequent PACs.  Because of  this, his beta-blocker dosage was titrated upward.  He was observed  closely for the next 24 hours.  Also, he developed leukocytosis, and an  urinalysis was performed which was positive for Gram-negative rods.  He  was started on p.o. Keflex for presumed urinary tract infection.  Upon  reevaluation in the afternoon on December 07, 2008, he was noted to be  afebrile with stable vital signs.  He was maintaining sinus rhythm with  PACs.  He was otherwise stable.  His labs from the previous day showed  hemoglobin of 9.0, hematocrit 26.7, platelets 184, white count 8.9.  Renal function back to baseline with BUN of 20 and creatinine 1.48.  It  was felt that since he had remained stable and was otherwise doing well,  he could be discharged at that time.   Discharge medications are unchanged from the previously dictated  discharge summary with the exception of Lopressor which was increased to  50 mg b.i.d.  Also, he was sent home on Keflex 500 mg t.i.d. x10 days.  His Coumadin dose at discharge was 4 mg daily, and he was to follow up  within 48 hours for a PT and INR.   His discharge instructions and followup are unchanged from the  previously dictated discharge summary.      Coral Ceo, P.A.      Kerin Perna, M.D.  Electronically Signed    GC/MEDQ  D:  01/20/2009  T:  01/20/2009  Job:  [id number]   cc:   Gerlene Burdock A. Alanda Amass, M.D.  Merlene Laughter. Renae Gloss, M.D.

## 2011-08-02 HISTORY — PX: CARDIOVASCULAR STRESS TEST: SHX262

## 2011-08-03 LAB — CBC
HCT: 36.7 — ABNORMAL LOW
HCT: 38.3 — ABNORMAL LOW
HCT: 39.3
Hemoglobin: 12.4 — ABNORMAL LOW
Hemoglobin: 12.6 — ABNORMAL LOW
Hemoglobin: 12.9 — ABNORMAL LOW
Hemoglobin: 13.6
Hemoglobin: 13
MCHC: 33.1
MCHC: 33.1
Platelets: 177
RBC: 4.11 — ABNORMAL LOW
RBC: 4.22
RBC: 4.3
RBC: 4.39
RBC: 4.36
RDW: 13.6
RDW: 13.8
RDW: 14.2
RDW: 13.9
WBC: 7
WBC: 7.1
WBC: 8.6

## 2011-08-03 LAB — BASIC METABOLIC PANEL
BUN: 18
Calcium: 9.1
Calcium: 9.1
Calcium: 9.7
GFR calc Af Amer: >60
GFR calc Af Amer: >60
GFR calc non Af Amer: 53 — ABNORMAL LOW
GFR calc non Af Amer: 57 — ABNORMAL LOW
GFR calc non Af Amer: 52 — ABNORMAL LOW
Glucose, Bld: 103 — ABNORMAL HIGH
Glucose, Bld: 128 — ABNORMAL HIGH
Glucose, Bld: 109 — ABNORMAL HIGH
Potassium: 3.2 — ABNORMAL LOW
Potassium: 4
Potassium: 4.7
Sodium: 133 — ABNORMAL LOW
Sodium: 141
Sodium: 137

## 2011-08-03 LAB — PROTIME-INR
INR: 1
INR: 1.1
INR: 1.1
INR: 1.4
INR: 2 — ABNORMAL HIGH
INR: 2.3 — ABNORMAL HIGH
Prothrombin Time: 13.5
Prothrombin Time: 13.9
Prothrombin Time: 14.9
Prothrombin Time: 17.8 — ABNORMAL HIGH
Prothrombin Time: 23.2 — ABNORMAL HIGH
Prothrombin Time: 25.8 — ABNORMAL HIGH

## 2011-08-03 LAB — HEPARIN LEVEL (UNFRACTIONATED)
Heparin Unfractionated: 0.31
Heparin Unfractionated: 0.75 — ABNORMAL HIGH
Heparin Unfractionated: 0.85 — ABNORMAL HIGH
Heparin Unfractionated: 0.36

## 2011-08-03 LAB — LIPID PANEL
Cholesterol: 143
Total CHOL/HDL Ratio: 3.3

## 2011-08-03 LAB — COMPREHENSIVE METABOLIC PANEL
BUN: 15
CO2: 29
Calcium: 9.4
GFR calc non Af Amer: >60
Glucose, Bld: 124 — ABNORMAL HIGH
Total Protein: 7.8

## 2011-08-03 LAB — TSH: TSH: 1.192

## 2011-08-10 LAB — CBC
HCT: 36.8 % — ABNORMAL LOW (ref 39.0–52.0)
Hemoglobin: 12.1 g/dL — ABNORMAL LOW (ref 13.0–17.0)
MCHC: 32.8 g/dL (ref 30.0–36.0)
RBC: 4.13 MIL/uL — ABNORMAL LOW (ref 4.22–5.81)
RDW: 13.8 % (ref 11.5–15.5)

## 2011-08-10 LAB — DIFFERENTIAL
Basophils Absolute: 0.1 10*3/uL (ref 0.0–0.1)
Eosinophils Relative: 2 % (ref 0–5)
Lymphocytes Relative: 24 % (ref 12–46)
Monocytes Absolute: 0.6 10*3/uL (ref 0.1–1.0)
Monocytes Relative: 8 % (ref 3–12)

## 2011-08-10 LAB — POCT I-STAT, CHEM 8
BUN: 13 mg/dL (ref 6–23)
Chloride: 104 mEq/L (ref 96–112)
Creatinine, Ser: 1.6 mg/dL — ABNORMAL HIGH (ref 0.4–1.5)
Sodium: 141 mEq/L (ref 135–145)
TCO2: 27 mmol/L (ref 0–100)

## 2011-08-10 LAB — POCT CARDIAC MARKERS
CKMB, poc: 2.9 ng/mL (ref 1.0–8.0)
Myoglobin, poc: 280 ng/mL (ref 12–200)
Troponin i, poc: <0.05 ng/mL (ref 0.00–0.09)
Troponin i, poc: <0.05 ng/mL (ref 0.00–0.09)

## 2011-08-10 LAB — ETHANOL: Alcohol, Ethyl (B): <5 mg/dL (ref 0–10)

## 2011-08-10 LAB — URINALYSIS, ROUTINE W REFLEX MICROSCOPIC
Bilirubin Urine: NEGATIVE
Glucose, UA: NEGATIVE mg/dL
Ketones, ur: NEGATIVE mg/dL
Specific Gravity, Urine: 1.014 (ref 1.005–1.030)
pH: 7.5 (ref 5.0–8.0)

## 2011-08-10 LAB — URINE MICROSCOPIC-ADD ON

## 2011-08-10 LAB — GLUCOSE, CAPILLARY: Glucose-Capillary: 94 mg/dL (ref 70–99)

## 2011-08-10 LAB — URINE CULTURE

## 2011-10-03 ENCOUNTER — Encounter (INDEPENDENT_AMBULATORY_CARE_PROVIDER_SITE_OTHER): Payer: Self-pay | Admitting: Cardiovascular Disease

## 2012-01-25 ENCOUNTER — Other Ambulatory Visit: Payer: Self-pay | Admitting: Family Medicine

## 2012-01-25 ENCOUNTER — Ambulatory Visit
Admission: RE | Admit: 2012-01-25 | Discharge: 2012-01-25 | Disposition: A | Discharge status: Home / Self Care | Payer: Medicare Other | Source: Ambulatory Visit | Attending: Family Medicine | Admitting: Family Medicine

## 2012-01-25 DIAGNOSIS — R52 Pain, unspecified: Secondary | ICD-10-CM

## 2012-01-25 DIAGNOSIS — W19XXXA Unspecified fall, initial encounter: Secondary | ICD-10-CM

## 2012-03-01 ENCOUNTER — Other Ambulatory Visit (HOSPITAL_COMMUNITY): Payer: Self-pay | Admitting: Ophthalmology

## 2012-03-01 DIAGNOSIS — G589 Mononeuropathy, unspecified: Secondary | ICD-10-CM

## 2012-03-06 ENCOUNTER — Ambulatory Visit (HOSPITAL_COMMUNITY)
Admission: RE | Admit: 2012-03-06 | Discharge: 2012-03-06 | Disposition: A | Discharge status: Home / Self Care | Payer: Medicare Other | Source: Ambulatory Visit | Attending: Ophthalmology | Admitting: Ophthalmology

## 2012-03-06 ENCOUNTER — Other Ambulatory Visit (HOSPITAL_COMMUNITY): Payer: Medicare Other

## 2012-03-06 DIAGNOSIS — H532 Diplopia: Secondary | ICD-10-CM | POA: Insufficient documentation

## 2012-03-06 DIAGNOSIS — R42 Dizziness and giddiness: Secondary | ICD-10-CM | POA: Insufficient documentation

## 2012-03-06 DIAGNOSIS — G589 Mononeuropathy, unspecified: Secondary | ICD-10-CM

## 2012-03-06 DIAGNOSIS — H4920 Sixth [abducent] nerve palsy, unspecified eye: Secondary | ICD-10-CM | POA: Insufficient documentation

## 2012-03-06 DIAGNOSIS — R51 Headache: Secondary | ICD-10-CM | POA: Insufficient documentation

## 2012-03-06 LAB — CREATININE, SERUM: GFR calc non Af Amer: 63 mL/min — ABNORMAL LOW (ref >90)

## 2012-03-06 MED ORDER — GADOBENATE DIMEGLUMINE 529 MG/ML IV SOLN
20.0000 mL | Freq: Once | INTRAVENOUS | Status: AC
  Administered 2012-03-06: 20 mL via INTRAVENOUS

## 2013-01-04 ENCOUNTER — Inpatient Hospital Stay (HOSPITAL_COMMUNITY)
Admission: EM | Admit: 2013-01-04 | Discharge: 2013-01-12 | DRG: 393 | Disposition: A | Discharge status: Home / Self Care | Payer: Medicare Other | Attending: Internal Medicine | Admitting: Internal Medicine

## 2013-01-04 ENCOUNTER — Emergency Department (HOSPITAL_COMMUNITY): Payer: Medicare Other

## 2013-01-04 ENCOUNTER — Encounter (HOSPITAL_COMMUNITY): Payer: Self-pay

## 2013-01-04 DIAGNOSIS — Z86718 Personal history of other venous thrombosis and embolism: Secondary | ICD-10-CM

## 2013-01-04 DIAGNOSIS — Z951 Presence of aortocoronary bypass graft: Secondary | ICD-10-CM

## 2013-01-04 DIAGNOSIS — Z8546 Personal history of malignant neoplasm of prostate: Secondary | ICD-10-CM

## 2013-01-04 DIAGNOSIS — Z952 Presence of prosthetic heart valve: Secondary | ICD-10-CM

## 2013-01-04 DIAGNOSIS — G929 Unspecified toxic encephalopathy: Secondary | ICD-10-CM | POA: Diagnosis not present

## 2013-01-04 DIAGNOSIS — H9190 Unspecified hearing loss, unspecified ear: Secondary | ICD-10-CM | POA: Diagnosis present

## 2013-01-04 DIAGNOSIS — I252 Old myocardial infarction: Secondary | ICD-10-CM

## 2013-01-04 DIAGNOSIS — K639 Disease of intestine, unspecified: Principal | ICD-10-CM | POA: Diagnosis present

## 2013-01-04 DIAGNOSIS — Z86711 Personal history of pulmonary embolism: Secondary | ICD-10-CM

## 2013-01-04 DIAGNOSIS — T434X5A Adverse effect of butyrophenone and thiothixene neuroleptics, initial encounter: Secondary | ICD-10-CM | POA: Diagnosis not present

## 2013-01-04 DIAGNOSIS — Z9181 History of falling: Secondary | ICD-10-CM

## 2013-01-04 DIAGNOSIS — I10 Essential (primary) hypertension: Secondary | ICD-10-CM | POA: Diagnosis present

## 2013-01-04 DIAGNOSIS — Y9210 Unspecified residential institution as the place of occurrence of the external cause: Secondary | ICD-10-CM | POA: Diagnosis not present

## 2013-01-04 DIAGNOSIS — Z7982 Long term (current) use of aspirin: Secondary | ICD-10-CM

## 2013-01-04 DIAGNOSIS — F0390 Unspecified dementia without behavioral disturbance: Secondary | ICD-10-CM | POA: Diagnosis present

## 2013-01-04 DIAGNOSIS — D500 Iron deficiency anemia secondary to blood loss (chronic): Secondary | ICD-10-CM | POA: Diagnosis present

## 2013-01-04 DIAGNOSIS — Z79899 Other long term (current) drug therapy: Secondary | ICD-10-CM

## 2013-01-04 DIAGNOSIS — W19XXXA Unspecified fall, initial encounter: Secondary | ICD-10-CM | POA: Diagnosis present

## 2013-01-04 DIAGNOSIS — I2510 Atherosclerotic heart disease of native coronary artery without angina pectoris: Secondary | ICD-10-CM | POA: Diagnosis present

## 2013-01-04 DIAGNOSIS — R609 Edema, unspecified: Secondary | ICD-10-CM | POA: Diagnosis present

## 2013-01-04 DIAGNOSIS — Z66 Do not resuscitate: Secondary | ICD-10-CM | POA: Diagnosis present

## 2013-01-04 DIAGNOSIS — Z7901 Long term (current) use of anticoagulants: Secondary | ICD-10-CM

## 2013-01-04 DIAGNOSIS — G4733 Obstructive sleep apnea (adult) (pediatric): Secondary | ICD-10-CM | POA: Diagnosis present

## 2013-01-04 DIAGNOSIS — S0100XA Unspecified open wound of scalp, initial encounter: Secondary | ICD-10-CM | POA: Diagnosis present

## 2013-01-04 HISTORY — DX: Essential (primary) hypertension: I10

## 2013-01-04 HISTORY — DX: Atherosclerotic heart disease of native coronary artery without angina pectoris: I25.10

## 2013-01-04 HISTORY — DX: Acute myocardial infarction, unspecified: I21.9

## 2013-01-04 LAB — POCT I-STAT, CHEM 8
BUN: 14 mg/dL (ref 6–23)
Potassium: 4.1 mEq/L (ref 3.5–5.1)
Sodium: 142 mEq/L (ref 135–145)
TCO2: 29 mmol/L (ref 0–100)

## 2013-01-04 LAB — PREPARE RBC (CROSSMATCH)

## 2013-01-04 LAB — CBC WITH DIFFERENTIAL/PLATELET
Eosinophils Absolute: 0.1 10*3/uL (ref 0.0–0.7)
Eosinophils Relative: 1 % (ref 0–5)
Hemoglobin: 5 g/dL — CL (ref 13.0–17.0)
Lymphocytes Relative: 26 % (ref 12–46)
Lymphs Abs: 1.3 10*3/uL (ref 0.7–4.0)
MCH: 21.3 pg — ABNORMAL LOW (ref 26.0–34.0)
MCV: 71.1 fL — ABNORMAL LOW (ref 78.0–100.0)
Monocytes Relative: 10 % (ref 3–12)
Neutrophils Relative %: 63 % (ref 43–77)
Platelets: 173 10*3/uL (ref 150–400)
RBC: 2.35 MIL/uL — ABNORMAL LOW (ref 4.22–5.81)
WBC: 5.2 10*3/uL (ref 4.0–10.5)

## 2013-01-04 LAB — GLUCOSE, CAPILLARY: Glucose-Capillary: 90 mg/dL (ref 70–99)

## 2013-01-04 MED ORDER — DIPHENHYDRAMINE HCL 25 MG PO CAPS
25.0000 mg | ORAL_CAPSULE | Freq: Once | ORAL | Status: AC
  Administered 2013-01-05: 25 mg via ORAL
  Filled 2013-01-04: qty 1

## 2013-01-04 MED ORDER — HYDROCODONE-ACETAMINOPHEN 5-325 MG PO TABS
1.0000 | ORAL_TABLET | ORAL | Status: DC | PRN
  Administered 2013-01-07: 1 via ORAL
  Administered 2013-01-08: 2 via ORAL
  Administered 2013-01-12: 1 via ORAL
  Filled 2013-01-04 (×2): qty 1
  Filled 2013-01-04: qty 2

## 2013-01-04 MED ORDER — POTASSIUM CHLORIDE CRYS ER 10 MEQ PO TBCR
10.0000 meq | EXTENDED_RELEASE_TABLET | Freq: Every day | ORAL | Status: DC
  Administered 2013-01-05 – 2013-01-12 (×8): 10 meq via ORAL
  Filled 2013-01-04 (×8): qty 1

## 2013-01-04 MED ORDER — ONDANSETRON HCL 4 MG/2ML IJ SOLN: 4.0000 mg | Freq: Four times a day (QID) | INTRAMUSCULAR | Status: DC | PRN

## 2013-01-04 MED ORDER — ACETAMINOPHEN 650 MG RE SUPP: 650.0000 mg | Freq: Four times a day (QID) | RECTAL | Status: DC | PRN

## 2013-01-04 MED ORDER — SODIUM CHLORIDE 0.9 % IV SOLN: 250.0000 mL | INTRAVENOUS | Status: DC | PRN

## 2013-01-04 MED ORDER — DOCUSATE SODIUM 100 MG PO CAPS
100.0000 mg | ORAL_CAPSULE | Freq: Two times a day (BID) | ORAL | Status: DC
  Administered 2013-01-05 – 2013-01-12 (×16): 100 mg via ORAL
  Filled 2013-01-04 (×18): qty 1

## 2013-01-04 MED ORDER — ACETAMINOPHEN 325 MG PO TABS
650.0000 mg | ORAL_TABLET | Freq: Four times a day (QID) | ORAL | Status: DC | PRN
  Filled 2013-01-04: qty 2

## 2013-01-04 MED ORDER — FUROSEMIDE 20 MG PO TABS
20.0000 mg | ORAL_TABLET | Freq: Every day | ORAL | Status: DC
  Administered 2013-01-05 – 2013-01-12 (×8): 20 mg via ORAL
  Filled 2013-01-04 (×9): qty 1

## 2013-01-04 MED ORDER — METOPROLOL TARTRATE 25 MG PO TABS
25.0000 mg | ORAL_TABLET | Freq: Two times a day (BID) | ORAL | Status: DC
  Administered 2013-01-05 – 2013-01-12 (×15): 25 mg via ORAL
  Filled 2013-01-04 (×17): qty 1

## 2013-01-04 MED ORDER — SODIUM CHLORIDE 0.9 % IJ SOLN
3.0000 mL | INTRAMUSCULAR | Status: DC | PRN
  Administered 2013-01-09: 3 mL via INTRAVENOUS

## 2013-01-04 MED ORDER — ACETAMINOPHEN 325 MG PO TABS
650.0000 mg | ORAL_TABLET | Freq: Once | ORAL | Status: AC
  Administered 2013-01-05: 650 mg via ORAL

## 2013-01-04 MED ORDER — ONDANSETRON HCL 4 MG PO TABS: 4.0000 mg | ORAL_TABLET | Freq: Four times a day (QID) | ORAL | Status: DC | PRN

## 2013-01-04 MED ORDER — SODIUM CHLORIDE 0.9 % IJ SOLN
3.0000 mL | Freq: Two times a day (BID) | INTRAMUSCULAR | Status: DC
  Administered 2013-01-05 – 2013-01-10 (×7): 3 mL via INTRAVENOUS

## 2013-01-04 MED ORDER — ASPIRIN 81 MG PO CHEW
81.0000 mg | CHEWABLE_TABLET | Freq: Every day | ORAL | Status: DC
  Administered 2013-01-05 – 2013-01-12 (×8): 81 mg via ORAL
  Filled 2013-01-04 (×8): qty 1

## 2013-01-04 MED ORDER — SODIUM CHLORIDE 0.9 % IJ SOLN
3.0000 mL | Freq: Two times a day (BID) | INTRAMUSCULAR | Status: DC
  Administered 2013-01-05 – 2013-01-08 (×5): 3 mL via INTRAVENOUS

## 2013-01-04 MED ORDER — DONEPEZIL HCL 10 MG PO TABS
10.0000 mg | ORAL_TABLET | Freq: Every day | ORAL | Status: DC
  Administered 2013-01-05 – 2013-01-11 (×8): 10 mg via ORAL
  Filled 2013-01-04 (×10): qty 1

## 2013-01-04 MED ORDER — ATORVASTATIN CALCIUM 40 MG PO TABS
40.0000 mg | ORAL_TABLET | Freq: Every day | ORAL | Status: DC
  Administered 2013-01-05 – 2013-01-11 (×7): 40 mg via ORAL
  Filled 2013-01-04 (×8): qty 1

## 2013-01-04 MED ORDER — SODIUM CHLORIDE 0.9 % IV SOLN
500.0000 mL | Freq: Once | INTRAVENOUS | Status: AC
  Administered 2013-01-05: 500 mL via INTRAVENOUS

## 2013-01-04 MED ORDER — AMLODIPINE BESYLATE 10 MG PO TABS
10.0000 mg | ORAL_TABLET | Freq: Every day | ORAL | Status: DC
  Administered 2013-01-05 – 2013-01-12 (×8): 10 mg via ORAL
  Filled 2013-01-04 (×8): qty 1

## 2013-01-04 MED ORDER — SERTRALINE HCL 100 MG PO TABS
200.0000 mg | ORAL_TABLET | Freq: Every day | ORAL | Status: DC
  Administered 2013-01-05 – 2013-01-12 (×8): 200 mg via ORAL
  Filled 2013-01-04 (×8): qty 2

## 2013-01-04 NOTE — ED Notes (Signed)
MS. [name] [name] ( SPOUSE ) CONTACT NO. [phone].

## 2013-01-04 NOTE — ED Notes (Signed)
DR.Beaton shown results of Istat Chem8. ED-Lab.

## 2013-01-04 NOTE — ED Notes (Addendum)
cbg 90  

## 2013-01-04 NOTE — Progress Notes (Signed)
Orthopedic Tech Progress Note Patient Details:  [name] [name] September 23, 1925 [id number]  Patient ID: [name], male   DOB: 01-Aug-1925, 79 y.o.   MRN: [id number] Made level 2 trauma visit  Nikki Dom 01/04/2013, 6:46 PM

## 2013-01-04 NOTE — ED Provider Notes (Signed)
History     CSN: [id number]  Arrival date & time 01/04/13  1634   First MD Initiated Contact with Patient 01/04/13 1710      Chief Complaint  Patient presents with  . Fall    Fall   Patient is an 79 year old male who sustained a mechanical ground-level fall today approximately 2 hours prior to arrival. Patient reports that he was leaning forward to pick up firewood for his fireplace and essentially fell too far forward. No loss of consciousness. Patient sustained laceration to left side of scalp. Moderate pain in that location no radiation no severe headaches. The patient does take Coumadin. Patient also complaining of left shoulder pain, worse with movement better with rest, no other associated symptoms. No neck pain. No back pain. No chest pain. No abdominal pain. No other extremity pain  Past Medical History  Diagnosis Date  . Hypertension   . Coronary artery disease   . MI (myocardial infarction)     History reviewed. No pertinent past surgical history.  History reviewed. No pertinent family history.  History  Substance Use Topics  . Smoking status: Never Smoker   . Smokeless tobacco: Not on file  . Alcohol Use: No      Review of Systems  Constitutional: Negative for chills.  HENT: Negative for congestion, sore throat and neck pain.   Respiratory: Negative for cough.   Gastrointestinal: Negative for diarrhea and constipation.  Endocrine: Negative for polyuria.  Genitourinary: Negative for dysuria.  Skin: Negative for rash.  Psychiatric/Behavioral: Negative.   All other systems reviewed and are negative.    Allergies  Review of patient's allergies indicates no known allergies.  Home Medications  No current outpatient prescriptions on file.  BP 124/100  Pulse 57  Temp(Src) 98.6 F (37 C) (Oral)  Resp 17  SpO2 99%  Physical Exam  Nursing note and vitals reviewed. Constitutional: He is oriented to person, place, and time. He appears well-developed and  well-nourished. No distress.  HENT:  Head: Normocephalic and atraumatic.    Right Ear: External ear normal.  Left Ear: External ear normal.  Mouth/Throat: Oropharynx is clear and moist. No oropharyngeal exudate.  Eyes: Conjunctivae are normal. Pupils are equal, round, and reactive to light. Right eye exhibits no discharge.  Neck: Normal range of motion. Neck supple. No tracheal deviation present.  Cardiovascular: Normal rate, regular rhythm and intact distal pulses.   Pulmonary/Chest: Effort normal. No respiratory distress. He has no wheezes. He has no rales.  Abdominal: Soft. He exhibits no distension. There is no tenderness. There is no rebound and no guarding.  Genitourinary: Rectum normal.  Musculoskeletal: Normal range of motion.  Left shoulder: Patient with normal active range of motion. No deformities. Mild pain with range of motion.  Neurological: He is alert and oriented to person, place, and time.  Skin: Skin is warm and dry. No rash noted. He is not diaphoretic.  Psychiatric: He has a normal mood and affect.    ED Course  LACERATION REPAIR Date/Time: 01/04/2013 11:37 PM Performed by: Arloa Koh Authorized by: Arloa Koh Consent: Verbal consent obtained. Risks and benefits: risks, benefits and alternatives were discussed Consent given by: patient Patient understanding: patient states understanding of the procedure being performed Patient identity confirmed: verbally with patient and arm band Body area: head/neck Location details: scalp Laceration length: 3 cm Foreign bodies: no foreign bodies Tendon involvement: none Nerve involvement: none Vascular damage: no Local anesthetic: lidocaine 2% without epinephrine Anesthetic total: 8 ml Patient sedated:  no Preparation: Patient was prepped and draped in the usual sterile fashion. Irrigation solution: saline Debridement: none Degree of undermining: none Skin closure: staples Number of sutures: 5 Approximation:  close Patient tolerance: Patient tolerated the procedure well with no immediate complications.   (including critical care time)  Labs Reviewed  GLUCOSE, CAPILLARY  CBC WITH DIFFERENTIAL  PROTIME-INR    Date: 01/04/2013  Rate: 62  Rhythm: normal sinus rhythm  QRS Axis: left  Intervals: QT prolonged  ST/T Wave abnormalities: nonspecific T wave changes  Conduction Disutrbances:RBB  Narrative Interpretation:   Old EKG Reviewed: none available    Ct Head Wo Contrast  01/04/2013  **ADDENDUM** CREATED: 01/04/2013 18:19:41  Impression #1 should read: "Left scalp soft tissue injury WITHOUT underlying fracture."  **END ADDENDUM** SIGNED BY: Harley Hallmark, M.D.   01/04/2013  *RADIOLOGY REPORT*  Clinical Data: 79 year old male with vomiting, syncope, fall and head injury.  Posterior head laceration on the left.  On Coumadin.  CT HEAD WITHOUT CONTRAST  Technique:  Contiguous axial images were obtained from the base of the skull through the vertex without contrast.  Comparison: Brain MRI 03/06/2012 and earlier.  Findings: Minor ethmoid sinus mucosal thickening.  Other Visualized paranasal sinuses and mastoids are clear.  Left posterolateral convexity scalp hematoma and laceration with small volume of subcutaneous gas.  Hematoma measures up to 5 mm in thickness. Underlying left parietal bone intact.  No other acute scalp or orbit soft tissue findings.  Calcified atherosclerosis at the skull base.  Minimal cerebral volume loss since 2009.  No ventriculomegaly. No midline shift, mass effect, or evidence of mass lesion.  No acute intracranial hemorrhage identified.  No evidence of cortically based acute infarction identified.  No suspicious intracranial vascular hyperdensity.  IMPRESSION: 1.  Left scalp soft tissue injury and underlying fracture. 2. Normal for age noncontrast CT appearance of the brain.   Original Report Authenticated By: Erskine Speed, M.D.      1. Anemia   2. Dizziness and giddiness   3.  Hypertension   4. Personal history of DVT (deep vein thrombosis)    MDM   79 year old male on Coumadin who presents after mechanical ground-level fall. Sustained small head laceration. Head CT negative. Laceration repaired. Patient found to have significant anemia with hemoglobin of 5. Review of records positive for history of anemia with baseline hemoglobin being 8-9 range. Concerned that this may have been an acute on chronic anemia as a drop that fast would've caused vital sign instability however his pulse and blood pressure was normal throughout the stay. Patient transfused 2 units of packed red blood cells. Hemoccult-positive for blood. Discussed case with trauma incision was made the patient be more suited to medicine service for anemia treatment. Hospitalist consult for admission. Patient admitted. No other acute concerns while in the emergency department. Patient safe for admission.      Arloa Koh, MD 01/05/13 0003

## 2013-01-04 NOTE — ED Notes (Addendum)
PT.'S SPOUSE SIGNED CONSENT FOR BLOOD TRNSFUSIONS . RESTING WITH NO DISTRESS, DENIES PAIN / RESPIRATIONS UNLABORED . IV SITE UNREMARKABLE.

## 2013-01-04 NOTE — ED Notes (Addendum)
Pt states he vomited and was not feeling well. Pt states he blacked out and fell and hit head on stone floor. Lac noted to left side of back of head. No obvious neuro deficits noted at this time. Pt on coumadin. Pos LOC. Pt also c/o left shoulder pain from fall.

## 2013-01-04 NOTE — H&P (Signed)
PCP:  Alva Garnet., MD  Cardiology: Pearletha Furl. Alanda Amass GI: [name], MD   Chief Complaint:   fall  HPI: [name] [name] is a 79 y.o. male   has a past medical history of Hypertension; Coronary artery disease; and MI (myocardial infarction).   Presented with  Patient has been falling a lot lately. He had an unwitnessed fall and was noted to have a laceration to the left side of his head. Patient states that he felt dizzy in his legs gave out he's not sure she was consciousness. He should have some problems with dementia and hearing problems he is unable to provide detailed history. He has remote hx DVT's and PE's in past but more than 1 year ago. He is on chronic Coumadin. He has chronic peripheral edema. His INR is followed by Dr. Alanda Amass.  CT scan in ER did not show any evidence of intracranial bleeding. Of note the patient he has a history of iron deficiency anemia and blood in stools he is followed for this by [facility]. On November 23 2010 he had had an endoscopy/ colonoscopy done that showed polyps and internal hemorrhoids. 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 His gastric mucosa was H. pylori positive. Extremities unsure if he was treated for H. pylori and I do not have any records regarding this. His family is unsure what is his baseline hemoglobin the last hemoglobin having a system is 9.0 and that was in 2010 Family states patient had not had any chest pains or shortness of breath lately his leg swelling has been unchanged.   Review of Systems:     Pertinent positives include:dizziness, frequent falls, Bilateral lower extremity swelling   Constitutional:  No weight loss, night sweats, Fevers, chills, fatigue, weight loss  HEENT:  No headaches, Difficulty swallowing,Tooth/dental problems,Sore throat,  No sneezing, itching, ear ache, nasal congestion, post nasal drip,  Cardio-vascular:  No chest pain, Orthopnea, PND, anasarca,   palpitations. GI:  No heartburn, indigestion, abdominal pain, nausea, vomiting, diarrhea, change in bowel habits, loss of appetite, melena, blood in stool, hematemesis Resp:  no shortness of breath at rest. No dyspnea on exertion, No excess mucus, no productive cough, No non-productive cough, No coughing up of blood.No change in color of mucus.No wheezing. Skin:  no rash or lesions. No jaundice GU:  no dysuria, change in color of urine, no urgency or frequency. No straining to urinate.  No flank pain.  Musculoskeletal:  No joint pain or no joint swelling. No decreased range of motion. No back pain.  Psych:  No change in mood or affect. No depression or anxiety. No memory loss.  Neuro: no localizing neurological complaints, no tingling, no weakness, no double vision, no gait abnormality, no slurred speech, no confusion  Otherwise ROS are negative except for above, 10 systems were reviewed  Past Medical History: Past Medical History  Diagnosis Date  . Hypertension   . Coronary artery disease   . MI (myocardial infarction)    History reviewed. No pertinent past surgical history.   Medications: Prior to Admission medications   Medication Sig Start Date End Date Taking? Authorizing Provider  amLODipine (NORVASC) 10 MG tablet Take 10 mg by mouth daily.   Yes Historical Provider, MD  aspirin 81 MG tablet Take 81 mg by mouth daily.   Yes Historical Provider, MD  donepezil (ARICEPT) 10 MG tablet Take 10 mg by mouth at bedtime.   Yes Historical Provider, MD  folic acid (FOLVITE) 1 MG  tablet Take 2 mg by mouth daily.   Yes Historical Provider, MD  furosemide (LASIX) 20 MG tablet Take 20 mg by mouth daily.   Yes Historical Provider, MD  metoprolol tartrate (LOPRESSOR) 25 MG tablet Take 25 mg by mouth 2 (two) times daily.   Yes Historical Provider, MD  potassium chloride SA (K-DUR,KLOR-CON) 20 MEQ tablet Take 10 mEq by mouth daily.   Yes Historical Provider, MD  sertraline (ZOLOFT) 100 MG  tablet Take 200 mg by mouth daily.   Yes Historical Provider, MD  simvastatin (ZOCOR) 80 MG tablet Take 40 mg by mouth at bedtime.   Yes Historical Provider, MD  warfarin (COUMADIN) 5 MG tablet Take 2.5-5 mg by mouth daily. Take 5 mg 4 days a week. Take 2.5 mg 3 days of the week   Yes Historical Provider, MD    Allergies:  No Known Allergies  Social History:  Ambulatory  Independently  Lives at  Home with the family   reports that he has never smoked. He does not have any smokeless tobacco history on file. He reports that he does not drink alcohol or use illicit drugs.   Family History: family history includes Hypertension in his brother.    Physical Exam: Patient Vitals for the past 24 hrs:  BP Temp Temp src Pulse Resp SpO2  01/04/13 2130 134/62 mmHg - - 66 17 93 %  01/04/13 2115 125/74 mmHg - - 62 13 98 %  01/04/13 2110 142/62 mmHg - - 88 15 97 %  01/04/13 2100 142/62 mmHg - - 62 13 97 %  01/04/13 2045 140/68 mmHg - - 53 16 91 %  01/04/13 2040 138/74 mmHg 98.5 F (36.9 C) Oral 74 14 -  01/04/13 2030 149/64 mmHg - - 64 17 89 %  01/04/13 2015 138/57 mmHg 98.4 F (36.9 C) Oral 65 14 -  01/04/13 1945 147/84 mmHg - - 61 17 88 %  01/04/13 1930 147/65 mmHg - - 63 8 94 %  01/04/13 1920 139/67 mmHg - - 64 14 97 %  01/04/13 1830 134/58 mmHg - - 59 14 96 %  01/04/13 1815 133/69 mmHg - - 58 14 90 %  01/04/13 1800 147/61 mmHg - - 57 17 97 %  01/04/13 1749 135/67 mmHg 98.5 F (36.9 C) Oral - 20 97 %  01/04/13 1647 124/100 mmHg 98.6 F (37 C) Oral 57 17 99 %    1. General:  in No Acute distress 2. Psychological: Alert  but non-Oriented to The situation  3. Head/ENT:   Moist  Mucous Membranes                          Head  laceration and swelling to the left temporal region,  neck supple                           Poor Dentition 4. SKIN: normal  Skin turgor,  Skin clean Dry and intact no rash 5. Heart: Regular rate and rhythm no Murmur, Rub or gallop 6. Lungs: Clear to  auscultation bilaterally, no wheezes or crackles   7. Abdomen: Soft, non-tender, Non distended 8. Lower extremities: no clubbing, cyanosis,  bilateral edemaRight worse than left per family unchanged  9. Neurologically Grossly intact, moving all 4 extremities equally 10. MSK: Normal range of motion  body mass index is unknown because there is no height or weight on file.  Labs on Admission:   Recent Labs  01/04/13 1811  NA 142  K 4.1  CL 104  GLUCOSE 92  BUN 14  CREATININE 1.30   No results found for this basename: AST, ALT, ALKPHOS, BILITOT, PROT, ALBUMIN,  in the last 72 hours No results found for this basename: LIPASE, AMYLASE,  in the last 72 hours  Recent Labs  01/04/13 1730 01/04/13 1811  WBC 5.2  --   NEUTROABS 3.3  --   HGB 5.0* 5.4*  HCT 16.7* 16.0*  MCV 71.1*  --   PLT 173  --    No results found for this basename: CKTOTAL, CKMB, CKMBINDEX, TROPONINI,  in the last 72 hours No results found for this basename: TSH, T4TOTAL, FREET3, T3FREE, THYROIDAB,  in the last 72 hours No results found for this basename: VITAMINB12, FOLATE, FERRITIN, TIBC, IRON, RETICCTPCT,  in the last 72 hours Lab Results  Component Value Date   HGBA1C  Value: 6.3 (NOTE)   The ADA recommends the following therapeutic goal for glycemic   control related to Hgb A1C measurement:   Goal of Therapy:   < 7.0% Hgb A1C   Reference: American Diabetes Association: Clinical Practice   Recommendations 2008, Diabetes Care,  2008, 31:(Suppl 1).* 11/24/2008    CrCl is unknown because there is no height on file for the current visit. ABG    Component Value Date/Time   PHART 7.428 12/01/2008 1702   HCO3 21.6 12/01/2008 1702   TCO2 29 01/04/2013 1811   ACIDBASEDEF 3.0* 12/01/2008 1702   O2SAT 91.0 12/01/2008 1702     No results found for this basename: DDIMER     Other results:  I have pearsonaly reviewed this: ECG REPORT  Rate:62  Rhythm: Sinus rhythm with PACs and right bundle-branch block  ST&T  Change: No ischemic changes  Cultures:    Component Value Date/Time   SDES URINE, CLEAN CATCH 12/04/2008 1950   SPECREQUEST NONE 12/04/2008 1950   CULT ENTEROBACTER CLOACAE 12/04/2008 1950   REPTSTATUS 12/07/2008 FINAL 12/04/2008 1950       Radiological Exams on Admission: Ct Head Wo Contrast  01/04/2013  **ADDENDUM** CREATED: 01/04/2013 18:19:41  Impression #1 should read: "Left scalp soft tissue injury WITHOUT underlying fracture."  **END ADDENDUM** SIGNED BY: Harley Hallmark, M.D.   01/04/2013  *RADIOLOGY REPORT*  Clinical Data: 79 year old male with vomiting, syncope, fall and head injury.  Posterior head laceration on the left.  On Coumadin.  CT HEAD WITHOUT CONTRAST  Technique:  Contiguous axial images were obtained from the base of the skull through the vertex without contrast.  Comparison: Brain MRI 03/06/2012 and earlier.  Findings: Minor ethmoid sinus mucosal thickening.  Other Visualized paranasal sinuses and mastoids are clear.  Left posterolateral convexity scalp hematoma and laceration with small volume of subcutaneous gas.  Hematoma measures up to 5 mm in thickness. Underlying left parietal bone intact.  No other acute scalp or orbit soft tissue findings.  Calcified atherosclerosis at the skull base.  Minimal cerebral volume loss since 2009.  No ventriculomegaly. No midline shift, mass effect, or evidence of mass lesion.  No acute intracranial hemorrhage identified.  No evidence of cortically based acute infarction identified.  No suspicious intracranial vascular hyperdensity.  IMPRESSION: 1.  Left scalp soft tissue injury and underlying fracture. 2. Normal for age noncontrast CT appearance of the brain.   Original Report Authenticated By: Erskine Speed, M.D.     Chart has been reviewed  Assessment/Plan  This  is a 79 year old gentleman with frequent falls who presented with fall resulting in laceration to the head CT scan negative for intracranial bleed but he was noted to have hemoglobin  down to 5.4. His recent baseline is unclear.  Present on Admission:  . Anemia  - severe,  although likely chronic. We'll transfuse 2 units of packed red blood cells and Hemoccult stool, for right now would hold off on Coumadin will need further discussion with Dr. Alanda Amass about his risk and benefit ratios regarding further blood clots in his risk for falls and chronic anemia. In order to make a better decision will obtain Dopplers of lower extremities to document presence or absence of blood clot at this point. If patient is still very high risk we'll give consideration to IVC filter placement.  . Hypertension - continue home medications  . Dizziness and giddiness - monitor on telemetry cycle cardiac markers given his severe anemia we'll observe in the step down and follow CBC, most likely due to symptomatic anemia. Check carotid dopplers. Will have PT/OT evaluate   Prophylaxis: on coumadin, Protonix  CODE STATUS: DNR/DNI per patient's family and request   Other plan as per orders.  I have spent a total of 60 min on this admission  Nevaya Nagele 01/04/2013, 10:07 PM

## 2013-01-05 DIAGNOSIS — Z86718 Personal history of other venous thrombosis and embolism: Secondary | ICD-10-CM

## 2013-01-05 DIAGNOSIS — R42 Dizziness and giddiness: Secondary | ICD-10-CM

## 2013-01-05 LAB — CBC
Hemoglobin: 6.7 g/dL — CL (ref 13.0–17.0)
Hemoglobin: 8.2 g/dL — ABNORMAL LOW (ref 13.0–17.0)
MCH: 22.8 pg — ABNORMAL LOW (ref 26.0–34.0)
MCHC: 31.5 g/dL (ref 30.0–36.0)
MCV: 72.4 fL — ABNORMAL LOW (ref 78.0–100.0)
Platelets: 161 10*3/uL (ref 150–400)
RBC: 3.55 MIL/uL — ABNORMAL LOW (ref 4.22–5.81)
WBC: 6.9 10*3/uL (ref 4.0–10.5)

## 2013-01-05 LAB — COMPREHENSIVE METABOLIC PANEL
ALT: 9 U/L (ref 0–53)
AST: 15 U/L (ref 0–37)
Albumin: 2.9 g/dL — ABNORMAL LOW (ref 3.5–5.2)
CO2: 27 mEq/L (ref 19–32)
Calcium: 9 mg/dL (ref 8.4–10.5)
Chloride: 105 mEq/L (ref 96–112)
GFR calc non Af Amer: 57 mL/min — ABNORMAL LOW (ref >90)
Sodium: 139 mEq/L (ref 135–145)
Total Bilirubin: 0.8 mg/dL (ref 0.3–1.2)

## 2013-01-05 LAB — TSH: TSH: 1.247 u[IU]/mL (ref 0.350–4.500)

## 2013-01-05 LAB — URINALYSIS, ROUTINE W REFLEX MICROSCOPIC
Glucose, UA: NEGATIVE mg/dL
Hgb urine dipstick: NEGATIVE
Leukocytes, UA: NEGATIVE
Protein, ur: NEGATIVE mg/dL
pH: 8 (ref 5.0–8.0)

## 2013-01-05 LAB — PROTIME-INR
INR: 2.44 — ABNORMAL HIGH (ref 0.00–1.49)
Prothrombin Time: 25.4 seconds — ABNORMAL HIGH (ref 11.6–15.2)

## 2013-01-05 LAB — RETICULOCYTES: RBC.: 3.55 MIL/uL — ABNORMAL LOW (ref 4.22–5.81)

## 2013-01-05 LAB — IRON AND TIBC
Saturation Ratios: 22 % (ref 20–55)
TIBC: 359 ug/dL (ref 215–435)
UIBC: 280 ug/dL (ref 125–400)

## 2013-01-05 LAB — PHOSPHORUS: Phosphorus: 3 mg/dL (ref 2.3–4.6)

## 2013-01-05 LAB — TROPONIN I: Troponin I: <0.3 ng/mL (ref <0.30)

## 2013-01-05 MED ORDER — FOLIC ACID 1 MG PO TABS
2.0000 mg | ORAL_TABLET | Freq: Every day | ORAL | Status: DC
  Administered 2013-01-05 – 2013-01-12 (×8): 2 mg via ORAL
  Filled 2013-01-05 (×8): qty 2

## 2013-01-05 NOTE — Progress Notes (Addendum)
Pt arrived via wheelchair to room 5509.  Alert and oriented.  Patient seems angry that he left his other room.  "What was wrong with the room I had", "I would just as soon go home".  Unable to place telemetry on patient as he keeps wanting to get oob and not allow [place] to admit him to unit such as look at his skin, ask questions and orient him to his new environment.  His family is helpful in getting the patient to cooperate.  Telemetry placed on patient, hx of afib, PVC occasionally,  oriented him and family to room.  Will encourage staff to place patient in a closer room to the nursing station, will use bed alarm.  Patient has a patent NSL in his right a/c.  Skin intact except for back of left head of 2x2 with some serosang drainage small amount.  Patient has history of dementia, answers all simple questions correctly just a little uncooperative.

## 2013-01-05 NOTE — Evaluation (Signed)
Physical Therapy Evaluation Patient Details Name: [name] [name] MRN: [id number] DOB: 02-18-1925 Today's Date: 01/05/2013 Time: 5784-6962 PT Time Calculation (min): 19 min  PT Assessment / Plan / Recommendation Clinical Impression  Pt s/p anemia, HTN, CAD,MI and falls with decr mobility secondary to decr endurance and decr balance.  Willl benefit from PT to address endurance and balance issues.  If goes home, will need 24 hour care, HHPT and use 4 wheeled RW at all times.      PT Assessment  Patient needs continued PT services    Follow Up Recommendations  Home health PT;Supervision/Assistance - 24 hour                Equipment Recommendations  None recommended by PT         Frequency Min 3X/week    Precautions / Restrictions Precautions Precautions: Fall Restrictions Weight Bearing Restrictions: No   Pertinent Vitals/Pain VSS< No pain      Mobility  Bed Mobility Bed Mobility: Rolling Right;Right Sidelying to Sit;Sitting - Scoot to Edge of Bed Rolling Right: 4: Min assist;With rail Right Sidelying to Sit: 4: Min assist;With rails Sitting - Scoot to Edge of Bed: 4: Min assist Details for Bed Mobility Assistance: Assist needed to elevate trunk. Transfers Transfers: Sit to Stand;Stand to Sit Sit to Stand: 4: Min guard;With upper extremity assist;From bed Stand to Sit: 4: Min guard;With upper extremity assist;To chair/3-in-1;With armrests Details for Transfer Assistance: cues needed for hand placement Ambulation/Gait Ambulation/Gait Assistance: 4: Min assist Ambulation Distance (Feet): 100 Feet Assistive device: Straight cane Ambulation/Gait Assistance Details: Pt ambulated with cane with occasional staggering gait.  Pt at times self corrected and did not need assist but at times did need min steadying assist to maintain balance.  Pt's son present and stated that pt has had 2-3 falls in the past 6 months.  Pt has a 4 wheeled walker that he does not use.  PT recommends  pt use the walker instead of the cane on d/c for safety as he is unsteady overal.   Gait Pattern: Step-to pattern;Trunk flexed;Narrow base of support;Decreased stride length Gait velocity: decreased Stairs: No Wheelchair Mobility Wheelchair Mobility: No         PT Diagnosis: Generalized weakness  PT Problem List: Decreased activity tolerance;Decreased balance;Decreased mobility;Decreased knowledge of precautions;Decreased safety awareness;Decreased knowledge of use of DME PT Treatment Interventions: DME instruction;Gait training;Functional mobility training;Stair training;Therapeutic activities;Therapeutic exercise;Balance training;Patient/family education   PT Goals Acute Rehab PT Goals PT Goal Formulation: With patient Time For Goal Achievement: 01/19/13 Potential to Achieve Goals: Good Pt will go Supine/Side to Sit: Independently PT Goal: Supine/Side to Sit - Progress: Goal set today Pt will go Sit to Stand: Independently;with upper extremity assist PT Goal: Sit to Stand - Progress: Goal set today Pt will Ambulate: 51 - 150 feet;with modified independence;with least restrictive assistive device PT Goal: Ambulate - Progress: Goal set today Pt will Go Up / Down Stairs: 1-2 stairs;with supervision;with least restrictive assistive device PT Goal: Up/Down Stairs - Progress: Goal set today  Visit Information  Last PT Received On: 01/05/13 Assistance Needed: +2    Subjective Data  Subjective: "I just get around fine with my cane."   Patient Stated Goal: To go home.   Prior Functioning  Home Living Lives With: Spouse Available Help at Discharge: Family;Available 24 hours/day Type of Home: House Home Access: Stairs to enter [employer] of Steps: 1 Home Layout: One level Bathroom Toilet: Standard Home Adaptive Equipment: Tub transfer bench;Walker -  four wheeled;Straight cane;Other (comment) (Bipap at night) Prior Function Level of Independence: Independent with  assistive device(s) Able to Take Stairs?: Yes Driving: No Vocation: Retired Musician: No difficulties Dominant Hand: Right    Cognition  Cognition Overall Cognitive Status: Appears within functional limits for tasks assessed/performed Arousal/Alertness: Awake/alert Orientation Level: Appears intact for tasks assessed Behavior During Session: [facility] for tasks performed    Extremity/Trunk Assessment Right Lower Extremity Assessment RLE ROM/Strength/Tone: [facility] for tasks assessed Left Lower Extremity Assessment LLE ROM/Strength/Tone: [facility] for tasks assessed Trunk Assessment Trunk Assessment: Kyphotic   Balance Static Sitting Balance Static Sitting - Balance Support: Bilateral upper extremity supported;Feet supported Static Sitting - Level of Assistance: 5: Stand by assistance Static Sitting - Comment/# of Minutes: Sat EOB with supervision for 5 minutes Static Standing Balance Static Standing - Balance Support: During functional activity;Right upper extremity supported Static Standing - Level of Assistance: 4: Min assist Static Standing - Comment/# of Minutes: Needed steadying assist with static stance with cane High Level Balance High Level Balance Activites: Direction changes;Turns;Head turns High Level Balance Comments: Pt with 2 significant LOB with min challenges to balance using cane.    End of Session PT - End of Session Equipment Utilized During Treatment: Gait belt Activity Tolerance: Patient tolerated treatment well Patient left: in chair;with call bell/phone within reach;with family/visitor present Nurse Communication: Mobility status       INGOLD,DAWN 01/05/2013, 10:27 AM  [facility] [phone] (pager)

## 2013-01-05 NOTE — ED Provider Notes (Signed)
I saw and evaluated the patient, reviewed the resident's note and I agree with the findings and plan.   .Face to face Exam:  General:  Awake HEENT:  Atraumatic Resp:  Normal effort Abd:  Nondistended Neuro:No focal weakness Lymph: No adenopathy   Lilliane Sposito L Bernardina Cacho, MD 01/05/13 1517 

## 2013-01-05 NOTE — Progress Notes (Signed)
Vascular Lab Preliminary Results.  Carotid Doppler= Bilateral:  No evidence of hemodynamically significant internal carotid artery stenosis.   Vertebral artery flow is antegrade.    Lower Extremity Venous Duplex = Bilateral:  No evidence of DVT, superficial thrombosis, or Baker's Cyst.  Farrel Demark, RDMS, RVT 01/05/2013

## 2013-01-05 NOTE — Progress Notes (Signed)
Chaplain responded to ED B17 after receiving a trauma level II page. Pt was responsive and alert. Pt fell in his room and sustained a cut to his head. Pt appeared to be doing fine but was in the process of being admitted for close monitoring. Chaplain provided ministry of empathic listening and presence. Chaplain also shared words of hope and encouragement with pt and family present during visitation. Chaplain will follow-up as needed. Kelle Darting [phone]

## 2013-01-05 NOTE — Progress Notes (Signed)
[facility] PROGRESS NOTE  [name] [name] ZOX:[id number] DOB: 03-23-1925 DOA: 01/04/2013 PCP: Alva Garnet., MD  Brief narrative: 79 y/o male who presented to the hospital after sustaining a fall and a laceration on his scalp. He was found to be anemic and admitted for blood transfusions.  Patient states that he felt dizzy in his legs gave out he's not sure she was consciousness. He should have some problems with dementia and hearing problems he is unable to provide detailed history. He has remote hx DVT's and PE's in past but more than 1 year ago. He is on chronic Coumadin. He has chronic peripheral edema. His INR is followed by Dr. Alanda Amass. CT scan in ER did not show any evidence of intracranial bleeding.  Of note the patient he has a history of iron deficiency anemia and blood in stools he is followed for this by [facility]. On November 23 2010 he had had an endoscopy/ colonoscopy done that showed polyps and internal hemorrhoids. 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 His gastric mucosa was H. pylori positive.  - unsure if he was treated for H. pylori and I do not have any records regarding this. His family is unsure what is his baseline hemoglobin the last hemoglobin having a system is 9.0 and that was in 2010  Family states patient had not had any chest pains or shortness of breath lately.    Assessment/Plan: Active Problems:   Anemia -Likely due to chronic blood loss- replace blood- check anemia panel and stool occults    Hypertension Continue home meds    Dizziness  -Likely due to blood loss and hopefully will resolve once blood replaced -PT eval today - he can go home with home health as long as he uses a walker    Personal history of DVT (deep vein thrombosis) - Apparently he had quite and extensive DVT in the past - will need to discuss further with Dr Alanda Amass - if anticoagulation needs to be continued, would recommend a repeat GI  eval and close f/u on Hgb.     Code Status: DNR  Family Communication: son  Disposition Plan: transfer to med/surg  Consultants:  none  Procedures:  none  Antibiotics:  none  DVT Prophylaxis:  Coumadin    HPI/Subjective: Pt very hard of hearing. He feels well today. Per pt and his son who is in the room, he is not prone to multiple falls and usually goes about his usual activities without a walker. He fell this time due to dizziness. He has not noted any bloody stools. His next appt with Dr Bosie Clos is coming up next week.   Objective: Filed Vitals:   01/05/13 0500 01/05/13 0600 01/05/13 0700 01/05/13 1216  BP: 139/58 142/65  157/76  Pulse: 63 56  64  Temp:   98.2 F (36.8 C) 97.2 F (36.2 C)  TempSrc:   Oral Oral  Resp: 16 15  16   Height:      Weight:      SpO2: 98% 99%  96%    Intake/Output Summary (Last 24 hours) at 01/05/13 2012 Last data filed at 01/05/13 1856  Gross per 24 hour  Intake   1710 ml  Output    400 ml  Net   1310 ml    Exam:   General:  Alert,no acute distress  Cardiovascular: RRR, 2/6 murmur at left upper sternal border which does not radiate to carotid arteries  Respiratory: CTA b/l   Abdomen: Soft, NT,  ND, BS+  Ext: no c/c/e  Data Reviewed: Basic Metabolic Panel:  Recent Labs Lab 01/04/13 1811 01/05/13 0554  NA 142 139  K 4.1 4.0  CL 104 105  CO2  --  27  GLUCOSE 92 81  BUN 14 12  CREATININE 1.30 1.14  CALCIUM  --  9.0  MG  --  1.9  PHOS  --  3.0   Liver Function Tests:  Recent Labs Lab 01/05/13 0554  AST 15  ALT 9  ALKPHOS 46  BILITOT 0.8  PROT 5.9*  ALBUMIN 2.9*   No results found for this basename: LIPASE, AMYLASE,  in the last 168 hours No results found for this basename: AMMONIA,  in the last 168 hours CBC:  Recent Labs Lab 01/04/13 1730 01/04/13 1811 01/05/13 0554 01/05/13 1221  WBC 5.2  --  6.2 6.9  NEUTROABS 3.3  --   --   --   HGB 5.0* 5.4* 6.7* 8.2*  HCT 16.7* 16.0* 21.3* 25.4*   MCV 71.1*  --  72.4* 71.5*  PLT 173  --  178 161   Cardiac Enzymes:  Recent Labs Lab 01/05/13 0032 01/05/13 0554 01/05/13 1221  TROPONINI <0.30 <0.30 <0.30   BNP (last 3 results) No results found for this basename: PROBNP,  in the last 8760 hours CBG:  Recent Labs Lab 01/04/13 1654  GLUCAP 90    Recent Results (from the past 240 hour(s))  MRSA PCR SCREENING     Status: None   Collection Time    01/04/13 11:35 PM      Result Value Range Status   MRSA by PCR NEGATIVE  NEGATIVE Final   Comment:            The GeneXpert MRSA Assay (FDA     approved for NASAL specimens     only), is one component of a     comprehensive MRSA colonization     surveillance program. It is not     intended to diagnose MRSA     infection nor to guide or     monitor treatment for     MRSA infections.     Studies: Ct Head Wo Contrast  01/04/2013  **ADDENDUM** CREATED: 01/04/2013 18:19:41  Impression #1 should read: "Left scalp soft tissue injury WITHOUT underlying fracture."  **END ADDENDUM** SIGNED BY: Harley Hallmark, M.D.   01/04/2013  *RADIOLOGY REPORT*  Clinical Data: 79 year old male with vomiting, syncope, fall and head injury.  Posterior head laceration on the left.  On Coumadin.  CT HEAD WITHOUT CONTRAST  Technique:  Contiguous axial images were obtained from the base of the skull through the vertex without contrast.  Comparison: Brain MRI 03/06/2012 and earlier.  Findings: Minor ethmoid sinus mucosal thickening.  Other Visualized paranasal sinuses and mastoids are clear.  Left posterolateral convexity scalp hematoma and laceration with small volume of subcutaneous gas.  Hematoma measures up to 5 mm in thickness. Underlying left parietal bone intact.  No other acute scalp or orbit soft tissue findings.  Calcified atherosclerosis at the skull base.  Minimal cerebral volume loss since 2009.  No ventriculomegaly. No midline shift, mass effect, or evidence of mass lesion.  No acute intracranial  hemorrhage identified.  No evidence of cortically based acute infarction identified.  No suspicious intracranial vascular hyperdensity.  IMPRESSION: 1.  Left scalp soft tissue injury and underlying fracture. 2. Normal for age noncontrast CT appearance of the brain.   Original Report Authenticated By: Erskine Speed, M.D.  Scheduled Meds: . amLODipine  10 mg Oral Daily  . aspirin  81 mg Oral Daily  . atorvastatin  40 mg Oral q1800  . docusate sodium  100 mg Oral BID  . donepezil  10 mg Oral QHS  . folic acid  2 mg Oral Daily  . furosemide  20 mg Oral Daily  . metoprolol tartrate  25 mg Oral BID  . potassium chloride SA  10 mEq Oral Daily  . sertraline  200 mg Oral Daily  . sodium chloride  3 mL Intravenous Q12H  . sodium chloride  3 mL Intravenous Q12H   Continuous Infusions:   ________________________________________________________________________  Time spent in minutes: 35    [facility]  [facility] Pager [phone] If 8PM-8AM, please contact night-coverage at [email or web address], password [facility] 01/05/2013, 8:12 PM  LOS: 1 day

## 2013-01-06 HISTORY — PX: TRANSTHORACIC ECHOCARDIOGRAM: SHX275

## 2013-01-06 LAB — CBC
HCT: 22.6 % — ABNORMAL LOW (ref 39.0–52.0)
HCT: 23.4 % — ABNORMAL LOW (ref 39.0–52.0)
Hemoglobin: 7.1 g/dL — ABNORMAL LOW (ref 13.0–17.0)
Hemoglobin: 7.5 g/dL — ABNORMAL LOW (ref 13.0–17.0)
MCH: 23.2 pg — ABNORMAL LOW (ref 26.0–34.0)
MCHC: 32.1 g/dL (ref 30.0–36.0)
MCV: 72.7 fL — ABNORMAL LOW (ref 78.0–100.0)
MCV: 72.4 fL — ABNORMAL LOW (ref 78.0–100.0)
RBC: 3.11 MIL/uL — ABNORMAL LOW (ref 4.22–5.81)
RBC: 3.23 MIL/uL — ABNORMAL LOW (ref 4.22–5.81)
RDW: 18.5 % — ABNORMAL HIGH (ref 11.5–15.5)
WBC: 6.6 10*3/uL (ref 4.0–10.5)

## 2013-01-06 LAB — OCCULT BLOOD, POC DEVICE: Fecal Occult Bld: POSITIVE — AB

## 2013-01-06 MED ORDER — FUROSEMIDE 10 MG/ML IJ SOLN
20.0000 mg | Freq: Once | INTRAMUSCULAR | Status: AC
  Administered 2013-01-06: 20 mg via INTRAVENOUS

## 2013-01-06 NOTE — Progress Notes (Signed)
*  PRELIMINARY RESULTS* Echocardiogram 2D Echocardiogram has been performed.  Jeryl Columbia 01/06/2013, 4:26 PM

## 2013-01-06 NOTE — Evaluation (Signed)
Occupational Therapy Evaluation Patient Details Name: [name] [name] MRN: [id number] DOB: 1924/12/13 Today's Date: 01/06/2013 Time: 7829-5621 OT Time Calculation (min): 21 min  OT Assessment / Plan / Recommendation Clinical Impression  This 79 yo male admitted with dizziness, frequent falls presents to acute OT with decreased stablity when up on his feet. Wife in room and made her aware that we (OT and PT are recommending 24 S/A at least initially at home due to his decreased steadiness when up on his feet)--she is not sure if she can arrange this or not. Will benefit from continued OT at SNF (or home if 24 S/A available)     OT Assessment  Patient needs continued OT Services    Follow Up Recommendations  SNF;Supervision/Assistance - 24 hour (or home if with HHOT if has 24 S/A))    Barriers to Discharge Decreased caregiver support    Equipment Recommendations  None recommended by OT       Frequency  Min 2X/week    Precautions / Restrictions Precautions Precautions: Fall Restrictions Weight Bearing Restrictions: No       ADL  Eating/Feeding: Simulated;Independent Where Assessed - Eating/Feeding: Chair Grooming: Simulated;Set up;Supervision/safety Where Assessed - Grooming: Unsupported sitting Upper Body Bathing: Simulated;Set up;Supervision/safety Where Assessed - Upper Body Bathing: Unsupported sitting Lower Body Bathing: Simulated;Supervision/safety;Set up;Min guard Where Assessed - Lower Body Bathing: Unsupported sit to stand Upper Body Dressing: Simulated;Supervision/safety;Set up Where Assessed - Upper Body Dressing: Unsupported sitting Lower Body Dressing: Performed;Min guard Where Assessed - Lower Body Dressing: Unsupported sit to stand Toilet Transfer: Simulated;Min guard Toilet Transfer Method: Sit to stand (from recliner) Toileting - Clothing Manipulation and Hygiene: Simulated;Min guard Where Assessed - Engineer, mining and Hygiene:  Standing Equipment Used: Gait belt Transfers/Ambulation Related to ADLs: Min guard A sit to stand and stand to sit, Min A for ambulation with SPC    OT Diagnosis: Generalized weakness;Cognitive deficits  OT Problem List: Impaired balance (sitting and/or standing);Decreased cognition OT Treatment Interventions: Self-care/ADL training;DME and/or AE instruction;Patient/family education;Cognitive remediation/compensation;Balance training   OT Goals Acute Rehab OT Goals OT Goal Formulation: With patient Time For Goal Achievement: 01/20/13 Potential to Achieve Goals: Good ADL Goals Pt Will Perform Grooming: with supervision;Standing at sink;Unsupported ADL Goal: Grooming - Progress: Goal set today Pt Will Perform Lower Body Bathing: with supervision;Unsupported;Sitting at sink;Standing at sink ADL Goal: Lower Body Bathing - Progress: Goal set today Pt Will Perform Lower Body Dressing: with supervision;Sit to stand from chair;Sit to stand from bed;Unsupported ADL Goal: Lower Body Dressing - Progress: Goal set today Pt Will Transfer to Toilet: with supervision;Ambulation;with DME;Regular height toilet;Comfort height toilet;Grab bars ADL Goal: Toilet Transfer - Progress: Goal set today Pt Will Perform Toileting - Clothing Manipulation: with supervision;Standing ADL Goal: Toileting - Clothing Manipulation - Progress: Goal set today Pt Will Perform Toileting - Hygiene: with supervision;Sit to stand from 3-in-1/toilet ADL Goal: Toileting - Hygiene - Progress: Goal set today Pt Will Perform Tub/Shower Transfer: Tub transfer;Ambulation;with supervision;Transfer tub bench;Grab bars (and getting down into tub) ADL Goal: Tub/Shower Transfer - Progress: Goal set today Miscellaneous OT Goals Miscellaneous OT Goal #1: Pt will show safety with gathering items for BADLs at a S level. OT Goal: Miscellaneous Goal #1 - Progress: Goal set today  Visit Information  Last OT Received On: 01/06/13 Assistance  Needed: +1    Subjective Data  Subjective: I don't want to get back to bed, it makes my legs swell since it does not raise my feet--it raises my knees but not  my feet---however pt wanted to stay in the recliner with his feet down   Prior Functioning     Home Living Lives With: Spouse Available Help at Discharge: Family;Available PRN/intermittently (wife works part time job) Type of Home: [employer] Access: Stairs to enter Secretary/administrator of Steps: 1 Home Layout: One level Bathroom Shower/Tub: Forensic scientist: Standard Home Adaptive Equipment: Tub transfer bench;Walker - four wheeled;Straight cane;Other (comment) Prior Function Level of Independence: Independent Able to Take Stairs?: Yes Driving: No Vocation: Retired 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, 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) Communication Communication: HOH Dominant Hand: Right            Cognition  Cognition Overall Cognitive Status: History of cognitive impairments - at baseline Arousal/Alertness: Awake/alert Behavior During Session: Agitated (wanted to do only what he wanted to)    Extremity/Trunk Assessment Right Upper Extremity Assessment RUE ROM/Strength/Tone: Within functional levels Left Upper Extremity Assessment LUE ROM/Strength/Tone: Within functional levels     Mobility Bed Mobility Details for Bed Mobility Assistance: Pt up in recliner upon arrival Transfers Transfers: Sit to Stand;Stand to Sit Sit to Stand: 4: Min guard;With upper extremity assist;With armrests;From chair/3-in-1 Stand to Sit: 4: Min guard;With upper extremity assist;With armrests;To chair/3-in-1 Details for Transfer Assistance: Cues for hand placement not needed           End of Session OT - End of Session Equipment Utilized During Treatment: Gait belt Activity Tolerance: Treatment limited secondary to agitation Patient left: in chair;with call bell/phone within reach;with chair alarm set Nurse  Communication:  (Pt up in recliner with alarm, does not want to back to bed)       Evette Georges [phone] 01/06/2013, 11:34 AM

## 2013-01-06 NOTE — Progress Notes (Signed)
[facility]  Patient Status: New  [facility] is providing the following services: PT and OT  If patient discharges after hours, please call [phone].   Wynelle Bourgeois 01/06/2013, 4:58 PM

## 2013-01-06 NOTE — Care Management Note (Addendum)
    Page 1 of 2   01/10/2013     2:36:18 PM   CARE MANAGEMENT NOTE 01/10/2013  Patient:  [name] [name]   Account Number:  [id number]  Date Initiated:  01/06/2013  Documentation initiated by:  Letha Cape  Subjective/Objective Assessment:   dx anemia  admit- lives with spouse.  Will have 24 hr with grandson.     Action/Plan:   pt eval- rec hhpt/ot with 24 hr   Anticipated DC Date:  01/11/2013   Anticipated DC Plan:  HOME W HOME HEALTH SERVICES      DC Planning Services  CM consult      [facility] Choice  HOME HEALTH   Choice offered to / List presented to:  C-3 Spouse        HH arranged  HH-2 PT  HH-3 OT      [facility] agency  [facility]   Status of service:  In process, will continue to follow Medicare Important Message given?   (If response is "NO", the following Medicare IM given date fields will be blank) Date Medicare IM given:   Date Additional Medicare IM given:    Discharge Disposition:  HOME W HOME HEALTH SERVICES  Per UR Regulation:  Reviewed for med. necessity/level of care/duration of stay  If discussed at Long Length of Stay Meetings, dates discussed:    Comments:  01/10/13 13:40 Letha Cape RN, BSN [phone] order in for rolling walker, Darrin notified, per Darrin patient received rolling walker in 2010 so his insurance will not pay for another one until he has had  it over 5 years.  Gave spouse a private duty list just in case she may need it.  01/06/13 13:38 Letha Cape RN, BSN [phone] patient lives with spouse, per physical therapy recs hhpt/ot. Spoke with wife she states there will be someone with him for 24 hrs .  Spouse states she will call me back she is in [employer] right now.  Awaiting her call back to offer choice for [facility] services.  Spouse called back and states they have been with [facility] before and would like to continue to work with them.  Referral made to [name] for HHPT/OT, Hilda Lias notified.  Soc will begin 24-48 hrs post discharge.   Will need  orders for pt/ot.

## 2013-01-06 NOTE — Progress Notes (Signed)
[facility] PROGRESS NOTE  Bing Duffey ZOX:[id number] DOB: 28-Feb-1925 DOA: 01/04/2013 PCP: Alva Garnet., MD  Assessment/Plan: Active Problems:   Anemia   Hypertension   Dizziness and giddiness   Personal history of DVT (deep vein thrombosis)    1. Anemia:  Patient presented following a fall, occasioned by dizziness. HB was found to be 5.0, MCV 71.1. FOBT was positive. Likely due to chronic blood loss/ironn deficiency, against a background of Coumadin-induced coagulopathy. Anemia panel is pending. Transfused 2 units PRBC and HB is 7.1 today. Will transfuse an additional 2 units PRBC today.  2. Fall: This was secondary to dizziness from profound anemia. Patient sustained a posterior scalp hematoma/laceration, but fortunately, no intracranial hemorrhage or bony injuries. PT has evaluated and recommended walker.  3. Hypertension: Controlled on pre-admission anti-hypertensives. 4. Personal history of DVT (deep vein thrombosis)/PE: Patient was on chronic Coumadin anticoagulation, pre-admission, and INR was 2.77 on presentation. Coumadin is on hold, and GI evaluation is indicated, if continued anticoagulation is to be contemplated.  5. OSA: Stable on nocturnal CPAP.   Code Status: DNR. Family Communication:  Disposition Plan: to be determined.   Brief narrative: 79 y/o male with known history of HTN, CAD, s/p MI, remote VTE, on chronic anticoagulation, chronic iron deficiency anemia, s/p EGD/Cononoscopy which revealed polyps and internal hemorrhoids 11/2010, chronic peripheral edema, OSA on CPAP, who presented to the hospital after sustaining a fall and a laceration on his scalp, when his legs gave out, after he felt dizzy. He was found to be anemic and admitted for blood transfusions. Head CT scan was negative for acute findings.    Consultants:  N/A.  Procedures:  Head CT scan.  Antibiotics:  N/A.   HPI/Subjective: Asymptomatic.  Objective: Vital signs in last  24 hours: Temp:  [97 F (36.1 C)-98.6 F (37 C)] 97 F (36.1 C) (03/03 0645) Pulse Rate:  [64-75] 66 (03/03 0645) Resp:  [16-18] 18 (03/03 0645) BP: (121-162)/(67-76) 162/76 mmHg (03/03 0645) SpO2:  [96 %-97 %] 96 % (03/03 0645) Weight change:     Intake/Output from previous day: 03/02 0701 - 03/03 0700 In: 1010 [P.O.:460; I.V.:550] Out: -      Physical Exam: General: Comfortable, alert, communicative, not short of breath at rest.  HEENT:  Marked clinical pallor, no jaundice, no conjunctival injection or discharge. NECK:  Supple, JVP not seen, no carotid bruits, no palpable lymphadenopathy, no palpable goiter. CHEST:  Clinically clear to auscultation, no wheezes, no crackles. HEART:  Sounds 1 and 2 heard, normal, regular, no murmurs. ABDOMEN:  Full, soft, non-tender, no palpable organomegaly, no palpable masses, normal bowel sounds. GENITALIA:  Not examined. LOWER EXTREMITIES:  Minimal pitting edema, palpable peripheral pulses. MUSCULOSKELETAL SYSTEM:  Generalized osteoarthritic changes, otherwise, normal. CENTRAL NERVOUS SYSTEM:  No focal neurologic deficit on gross examination.  Lab Results:  Recent Labs  01/05/13 1221 01/06/13 0440  WBC 6.9 6.6  HGB 8.2* 7.1*  HCT 25.4* 22.6*  PLT 161 181    Recent Labs  01/04/13 1811 01/05/13 0554  NA 142 139  K 4.1 4.0  CL 104 105  CO2  --  27  GLUCOSE 92 81  BUN 14 12  CREATININE 1.30 1.14  CALCIUM  --  9.0   Recent Results (from the past 240 hour(s))  MRSA PCR SCREENING     Status: None   Collection Time    01/04/13 11:35 PM      Result Value Range Status   MRSA by PCR NEGATIVE  NEGATIVE  Final   Comment:            The GeneXpert MRSA Assay (FDA     approved for NASAL specimens     only), is one component of a     comprehensive MRSA colonization     surveillance program. It is not     intended to diagnose MRSA     infection nor to guide or     monitor treatment for     MRSA infections.      Studies/Results: Ct Head Wo Contrast  01/04/2013  **ADDENDUM** CREATED: 01/04/2013 18:19:41  Impression #1 should read: "Left scalp soft tissue injury WITHOUT underlying fracture."  **END ADDENDUM** SIGNED BY: Harley Hallmark, M.D.   01/04/2013  *RADIOLOGY REPORT*  Clinical Data: 79 year old male with vomiting, syncope, fall and head injury.  Posterior head laceration on the left.  On Coumadin.  CT HEAD WITHOUT CONTRAST  Technique:  Contiguous axial images were obtained from the base of the skull through the vertex without contrast.  Comparison: Brain MRI 03/06/2012 and earlier.  Findings: Minor ethmoid sinus mucosal thickening.  Other Visualized paranasal sinuses and mastoids are clear.  Left posterolateral convexity scalp hematoma and laceration with small volume of subcutaneous gas.  Hematoma measures up to 5 mm in thickness. Underlying left parietal bone intact.  No other acute scalp or orbit soft tissue findings.  Calcified atherosclerosis at the skull base.  Minimal cerebral volume loss since 2009.  No ventriculomegaly. No midline shift, mass effect, or evidence of mass lesion.  No acute intracranial hemorrhage identified.  No evidence of cortically based acute infarction identified.  No suspicious intracranial vascular hyperdensity.  IMPRESSION: 1.  Left scalp soft tissue injury and underlying fracture. 2. Normal for age noncontrast CT appearance of the brain.   Original Report Authenticated By: Erskine Speed, M.D.     Medications: Scheduled Meds: . amLODipine  10 mg Oral Daily  . aspirin  81 mg Oral Daily  . atorvastatin  40 mg Oral q1800  . docusate sodium  100 mg Oral BID  . donepezil  10 mg Oral QHS  . folic acid  2 mg Oral Daily  . furosemide  20 mg Oral Daily  . metoprolol tartrate  25 mg Oral BID  . potassium chloride SA  10 mEq Oral Daily  . sertraline  200 mg Oral Daily  . sodium chloride  3 mL Intravenous Q12H  . sodium chloride  3 mL Intravenous Q12H   Continuous Infusions:  PRN  Meds:.sodium chloride, acetaminophen, acetaminophen, HYDROcodone-acetaminophen, ondansetron (ZOFRAN) IV, ondansetron, sodium chloride    LOS: 2 days   OTI,CHRISTOPHER  [facility] Pager [phone]. If 8PM-8AM, please contact night-coverage at [email or web address], password [facility] 01/06/2013, 9:32 AM  LOS: 2 days

## 2013-01-07 LAB — CBC
HCT: 29.4 % — ABNORMAL LOW (ref 39.0–52.0)
Hemoglobin: 9.8 g/dL — ABNORMAL LOW (ref 13.0–17.0)
MCH: 24.7 pg — ABNORMAL LOW (ref 26.0–34.0)
MCHC: 33.3 g/dL (ref 30.0–36.0)
MCV: 74.2 fL — ABNORMAL LOW (ref 78.0–100.0)
RBC: 3.96 MIL/uL — ABNORMAL LOW (ref 4.22–5.81)

## 2013-01-07 LAB — BASIC METABOLIC PANEL
BUN: 16 mg/dL (ref 6–23)
CO2: 30 mEq/L (ref 19–32)
Calcium: 9.3 mg/dL (ref 8.4–10.5)
Creatinine, Ser: 1.14 mg/dL (ref 0.50–1.35)
GFR calc non Af Amer: 57 mL/min — ABNORMAL LOW (ref >90)
Glucose, Bld: 102 mg/dL — ABNORMAL HIGH (ref 70–99)

## 2013-01-07 LAB — TYPE AND SCREEN: Unit division: 0

## 2013-01-07 LAB — VITAMIN B12: Vitamin B-12: 397 pg/mL (ref 211–911)

## 2013-01-07 LAB — PROTIME-INR: INR: 1.54 — ABNORMAL HIGH (ref 0.00–1.49)

## 2013-01-07 LAB — FOLATE: Folate: >20 ng/mL

## 2013-01-07 NOTE — Progress Notes (Signed)
[facility] PROGRESS NOTE  [name] [name] JYN:[id number] DOB: [birth year] DOA: 01/04/2013 PCP: Alva Garnet., MD  Assessment/Plan: Active Problems:   Anemia   Hypertension   Dizziness and giddiness   Personal history of DVT (deep vein thrombosis)    1. Anemia:  Patient presented following a fall, occasioned by dizziness. HB was found to be 5.0, MCV 71.1. FOBT was positive. Likely due to chronic blood loss/iron deficiency, against a background of Coumadin-induced coagulopathy. Anemia panel is unremarkable. Transfused a total of 4 units PRBC and HB is 9.8 today. Patient feels much better. Dr Dorena Cookey provided GI consultation, and it appears that patient is s/p colonoscopy in 11/2010, which revealed 2 or 3 polyps, one of which was flat in the transverse colon and high-grade dysplasia. The patient was recommended for surgery but never had it done. The concern, is that this may have progressed to malignancy. Provided patient is agreeable, colonoscopy is scheduled for 01/09/13.  2. Fall: This was secondary to dizziness from profound anemia. Patient sustained a posterior scalp hematoma/laceration, but fortunately, no intracranial hemorrhage or bony injuries. PT has evaluated and recommended walker.  3. Hypertension: Controlled on pre-admission anti-hypertensives. 4. Personal history of DVT (deep vein thrombosis)/PE: Patient was on chronic Coumadin anticoagulation, pre-admission, and INR was 2.77 on presentation. Coumadin is on hold, pending endoscopic evaluation, if continued anticoagulation is to be contemplated.  5. OSA: Stable on nocturnal CPAP.   Code Status: DNR. Family Communication:  Disposition Plan: to be determined.   Brief narrative: 79 y/o male with known history of HTN, CAD, s/p MI, remote VTE, on chronic anticoagulation, chronic iron deficiency anemia, s/p EGD/Cononoscopy which revealed polyps and internal hemorrhoids 11/2010, chronic peripheral edema, OSA on CPAP, who  presented to the hospital after sustaining a fall and a laceration on his scalp, when his legs gave out, after he felt dizzy. He was found to be anemic and admitted for blood transfusions. Head CT scan was negative for acute findings.    Consultants:  N/A.  Procedures:  Head CT scan.  Antibiotics:  N/A.   HPI/Subjective: No new issues.   Objective: Vital signs in last 24 hours: Temp:  [97.3 F (36.3 C)-99.1 F (37.3 C)] 97.9 F (36.6 C) (03/04 1429) Pulse Rate:  [63-71] 71 (03/04 1429) Resp:  [14-20] 20 (03/04 1429) BP: (108-158)/(61-78) 108/61 mmHg (03/04 1429) SpO2:  [96 %-100 %] 97 % (03/04 1429) Weight change:     Intake/Output from previous day: 03/03 0701 - 03/04 0700 In: 1330 [P.O.:680; Blood:650] Out: 2700 [Urine:2700] Total I/O In: 480 [P.O.:480] Out: 300 [Urine:300]   Physical Exam: General: Comfortable, alert, communicative, not short of breath at rest.  HEENT:  Mild clinical pallor, no jaundice, no conjunctival injection or discharge. NECK:  Supple, JVP not seen, no carotid bruits, no palpable lymphadenopathy, no palpable goiter. CHEST:  Clinically clear to auscultation, no wheezes, no crackles. HEART:  Sounds 1 and 2 heard, normal, regular, no murmurs. ABDOMEN:  Full, soft, non-tender, no palpable organomegaly, no palpable masses, normal bowel sounds. GENITALIA:  Not examined. LOWER EXTREMITIES:  Minimal pitting edema, palpable peripheral pulses. MUSCULOSKELETAL SYSTEM:  Generalized osteoarthritic changes, otherwise, normal. CENTRAL NERVOUS SYSTEM:  No focal neurologic deficit on gross examination.  Lab Results:  Recent Labs  01/06/13 1138 01/07/13 0610  WBC 7.1 7.3  HGB 7.5* 9.8*  HCT 23.4* 29.4*  PLT 188 177    Recent Labs  01/05/13 0554 01/07/13 0610  NA 139 138  K 4.0 4.0  CL 105 101  CO2 27 30  GLUCOSE 81 102*  BUN 12 16  CREATININE 1.14 1.14  CALCIUM 9.0 9.3   Recent Results (from the past 240 hour(s))  MRSA PCR  SCREENING     Status: None   Collection Time    01/04/13 11:35 PM      Result Value Range Status   MRSA by PCR NEGATIVE  NEGATIVE Final   Comment:            The GeneXpert MRSA Assay (FDA     approved for NASAL specimens     only), is one component of a     comprehensive MRSA colonization     surveillance program. It is not     intended to diagnose MRSA     infection nor to guide or     monitor treatment for     MRSA infections.     Studies/Results: No results found.  Medications: Scheduled Meds: . amLODipine  10 mg Oral Daily  . aspirin  81 mg Oral Daily  . atorvastatin  40 mg Oral q1800  . docusate sodium  100 mg Oral BID  . donepezil  10 mg Oral QHS  . folic acid  2 mg Oral Daily  . furosemide  20 mg Oral Daily  . metoprolol tartrate  25 mg Oral BID  . potassium chloride SA  10 mEq Oral Daily  . sertraline  200 mg Oral Daily  . sodium chloride  3 mL Intravenous Q12H  . sodium chloride  3 mL Intravenous Q12H   Continuous Infusions:  PRN Meds:.sodium chloride, acetaminophen, acetaminophen, HYDROcodone-acetaminophen, ondansetron (ZOFRAN) IV, ondansetron, sodium chloride    LOS: 3 days   OTI,CHRISTOPHER  [facility] Pager [phone]. If 8PM-8AM, please contact night-coverage at [email or web address], password [facility] 01/07/2013, 4:19 PM  LOS: 3 days

## 2013-01-07 NOTE — Progress Notes (Signed)
Physical Therapy Treatment Patient Details Name: [name] [name] MRN: [id number] DOB: [birth year] Today's Date: 01/07/2013 Time: 7829-5621 PT Time Calculation (min): 13 min  PT Assessment / Plan / Recommendation Comments on Treatment Session  Pt. with improved mobility today, noted mild instability initially with gait and with turns.  Feel pt. may be able to safely d/c home with single point cane if pt. continues to progress well.    Follow Up Recommendations        Does the patient have the potential to tolerate intense rehabilitation     Barriers to Discharge        Equipment Recommendations       Recommendations for Other Services    Frequency     Plan Discharge plan remains appropriate;Frequency remains appropriate    Precautions / Restrictions Precautions Precautions: Fall Restrictions Weight Bearing Restrictions: No   Pertinent Vitals/Pain Denied    Mobility  Bed Mobility Bed Mobility: Left Sidelying to Sit;Rolling Left Rolling Left: 6: Modified independent (Device/Increase time);With rail Left Sidelying to Sit: 6: Modified independent (Device/Increase time);HOB elevated;With rails (HOB 15 degrees) Transfers Transfers: Sit to Stand;Stand to Sit Sit to Stand: 5: Supervision;From bed;With upper extremity assist Stand to Sit: 5: Supervision;To chair/3-in-1;With upper extremity assist Ambulation/Gait Ambulation/Gait Assistance: 4: Min assist Ambulation Distance (Feet): 150 Feet Assistive device: Straight cane Ambulation/Gait Assistance Details: mild instability noted initially needing min A, progressed to minguard A Gait Pattern: Shuffle;Trunk flexed Gait velocity: decreased Stairs: No Wheelchair Mobility Wheelchair Mobility: No    Exercises     PT Diagnosis:    PT Problem List:   PT Treatment Interventions:     PT Goals Acute Rehab PT Goals PT Goal: Supine/Side to Sit - Progress: Progressing toward goal PT Goal: Sit to Stand - Progress: Progressing toward  goal PT Goal: Ambulate - Progress: Progressing toward goal PT Goal: Up/Down Stairs - Progress: Progressing toward goal  Visit Information  Last PT Received On: 01/07/13 Assistance Needed: +1    Subjective Data  Subjective: "I feel pretty good."   Cognition  Cognition Overall Cognitive Status: History of cognitive impairments - at baseline Arousal/Alertness: Awake/alert Orientation Level: Appears intact for tasks assessed Behavior During Session: [facility] for tasks performed    Balance  Balance Balance Assessed: Yes Static Sitting Balance Static Sitting - Balance Support: Bilateral upper extremity supported;Feet supported Static Sitting - Level of Assistance: 6: Modified independent (Device/Increase time) Static Standing Balance Static Standing - Balance Support: During functional activity;Right upper extremity supported Static Standing - Level of Assistance: 4: Min assist High Level Balance High Level Balance Activites: Direction changes;Turns High Level Balance Comments: min A needed for turns 180 degrees due to LOB  End of Session PT - End of Session Equipment Utilized During Treatment: Gait belt Activity Tolerance: Patient tolerated treatment well Patient left: in chair;with call bell/phone within reach Nurse Communication: Mobility status   GP     Feltis, Nicki Reaper 01/07/2013, 8:32 AM  Nicki Reaper. Feltis, PT, DPT [phone]

## 2013-01-07 NOTE — Consult Note (Signed)
[facility] Consult Note  Referring Provider: No ref. provider found Primary Care Physician:  [name] [name]., MD Primary Gastroenterologist:  Dr.  Antony Contras Complaint: Anemia and heme positive stool HPI: [name] [name] is an 79 y.o. black male  who presents with falling and weakness and was found to have a significant anemia with hemoglobin 5.0. His last charted hemoglobin here was 9.0. That was in 2010. The patient underwent EGD and colonoscopy in January 2012 with 2 or 3 polyps found one of which was flat in the transverse colon and high-grade dysplasia. The patient was recommended for surgery but perfusion never had it done. He is on Coumadin for coronary artery disease. He denies any specific GI symptoms but his stools were heme positive  Past Medical History  Diagnosis Date  . Hypertension   . Coronary artery disease   . MI (myocardial infarction)     History reviewed. No pertinent past surgical history.  Medications Prior to Admission  Medication Sig Dispense Refill  . amLODipine (NORVASC) 10 MG tablet Take 10 mg by mouth daily.      Marland Kitchen aspirin 81 MG tablet Take 81 mg by mouth daily.      Marland Kitchen donepezil (ARICEPT) 10 MG tablet Take 10 mg by mouth at bedtime.      . folic acid (FOLVITE) 1 MG tablet Take 2 mg by mouth daily.      . furosemide (LASIX) 20 MG tablet Take 20 mg by mouth daily.      . metoprolol tartrate (LOPRESSOR) 25 MG tablet Take 25 mg by mouth 2 (two) times daily.      . potassium chloride SA (K-DUR,KLOR-CON) 20 MEQ tablet Take 10 mEq by mouth daily.      . sertraline (ZOLOFT) 100 MG tablet Take 200 mg by mouth daily.      . simvastatin (ZOCOR) 80 MG tablet Take 40 mg by mouth at bedtime.      Marland Kitchen warfarin (COUMADIN) 5 MG tablet Take 2.5-5 mg by mouth daily. Take 5 mg 4 days a week. Take 2.5 mg 3 days of the week        Allergies: No Known Allergies  Family History  Problem Relation Age of Onset  . Hypertension Brother     Social History:  reports  that he has never smoked. He does not have any smokeless tobacco history on file. He reports that he does not drink alcohol or use illicit drugs.  Review of Systems: negative except as above   Blood pressure 158/78, pulse 66, temperature 97.3 F (36.3 C), temperature source Oral, resp. rate 20, height 5\' 9"  (1.753 m), weight 87.4 kg (192 lb 10.9 oz), SpO2 98.00%. Head: Normocephalic, without obvious abnormality, atraumatic Neck: no adenopathy, no carotid bruit, no JVD, supple, symmetrical, trachea midline and thyroid not enlarged, symmetric, no tenderness/mass/nodules Resp: clear to auscultation bilaterally Cardio: regular rate and rhythm, S1, S2 normal, no murmur, click, rub or gallop GI: Abdomen soft nondistended with normoactive bowel sounds. No hepatosplenomegaly mass or guarding Extremities: extremities normal, atraumatic, no cyanosis or edema  Results for orders placed during the hospital encounter of 01/04/13 (from the past 48 hour(s))  CBC     Status: Abnormal   Collection Time    01/06/13  4:40 AM      Result Value Range   WBC 6.6  4.0 - 10.5 K/uL   RBC 3.11 (*) 4.22 - 5.81 MIL/uL   Hemoglobin 7.1 (*) 13.0 - 17.0 g/dL   HCT 04.5 (*) 40.9 -  52.0 %   MCV 72.7 (*) 78.0 - 100.0 fL   MCH 22.8 (*) 26.0 - 34.0 pg   MCHC 31.4  30.0 - 36.0 g/dL   RDW 96.0 (*) 45.4 - 09.8 %   Platelets 181  150 - 400 K/uL  PREPARE RBC (CROSSMATCH)     Status: None   Collection Time    01/06/13 11:00 AM      Result Value Range   Order Confirmation ORDER PROCESSED BY BLOOD BANK    CBC     Status: Abnormal   Collection Time    01/06/13 11:38 AM      Result Value Range   WBC 7.1  4.0 - 10.5 K/uL   RBC 3.23 (*) 4.22 - 5.81 MIL/uL   Hemoglobin 7.5 (*) 13.0 - 17.0 g/dL   HCT 11.9 (*) 14.7 - 82.9 %   MCV 72.4 (*) 78.0 - 100.0 fL   MCH 23.2 (*) 26.0 - 34.0 pg   MCHC 32.1  30.0 - 36.0 g/dL   RDW 56.2 (*) 13.0 - 86.5 %   Platelets 188  150 - 400 K/uL  CBC     Status: Abnormal   Collection Time     01/07/13  6:10 AM      Result Value Range   WBC 7.3  4.0 - 10.5 K/uL   RBC 3.96 (*) 4.22 - 5.81 MIL/uL   Hemoglobin 9.8 (*) 13.0 - 17.0 g/dL   Comment: DELTA CHECK NOTED     POST TRANSFUSION SPECIMEN   HCT 29.4 (*) 39.0 - 52.0 %   MCV 74.2 (*) 78.0 - 100.0 fL   MCH 24.7 (*) 26.0 - 34.0 pg   MCHC 33.3  30.0 - 36.0 g/dL   RDW 78.4 (*) 69.6 - 29.5 %   Platelets 177  150 - 400 K/uL  BASIC METABOLIC PANEL     Status: Abnormal   Collection Time    01/07/13  6:10 AM      Result Value Range   Sodium 138  135 - 145 mEq/L   Potassium 4.0  3.5 - 5.1 mEq/L   Chloride 101  96 - 112 mEq/L   CO2 30  19 - 32 mEq/L   Glucose, Bld 102 (*) 70 - 99 mg/dL   BUN 16  6 - 23 mg/dL   Creatinine, Ser 2.84  0.50 - 1.35 mg/dL   Calcium 9.3  8.4 - 13.2 mg/dL   GFR calc non Af Amer 57 (*) >90 mL/min   GFR calc Af Amer 66 (*) >90 mL/min   Comment:            The eGFR has been calculated     using the CKD EPI equation.     This calculation has not been     validated in all clinical     situations.     eGFR's persistently     <90 mL/min signify     possible Chronic Kidney Disease.  PROTIME-INR     Status: Abnormal   Collection Time    01/07/13  6:10 AM      Result Value Range   Prothrombin Time 18.0 (*) 11.6 - 15.2 seconds   INR 1.54 (*) 0.00 - 1.49   No results found.  Assessment: Anemia and heme positive stool with known advanced polyp in the transverse colon 2 years ago with no surgery done, suspect is a significant cause of his blood loss and may have progressed to malignancy by now. Plan:  Discussed with patient and wife and daughter current situation. Patient does not seem to fully understand the situation and has been resistant to surgery in the past. His daughter and wife. The him regarding reassessment with colonoscopy. If he they decided in favor will prep tomorrow for colonoscopy the day after. [name] [name] C 01/07/2013, 1:29 PM

## 2013-01-07 NOTE — Progress Notes (Signed)
Patient refused to wear CPAP tonight. Will continue to monitor patient. Nelda Marseille, RN

## 2013-01-08 ENCOUNTER — Inpatient Hospital Stay (HOSPITAL_COMMUNITY): Payer: Medicare Other

## 2013-01-08 ENCOUNTER — Encounter (HOSPITAL_COMMUNITY): Payer: Self-pay | Admitting: Radiology

## 2013-01-08 LAB — CBC
HCT: 28 % — ABNORMAL LOW (ref 39.0–52.0)
Hemoglobin: 9.2 g/dL — ABNORMAL LOW (ref 13.0–17.0)
MCHC: 32.9 g/dL (ref 30.0–36.0)
MCV: 73.1 fL — ABNORMAL LOW (ref 78.0–100.0)
RDW: 20.3 % — ABNORMAL HIGH (ref 11.5–15.5)

## 2013-01-08 LAB — BASIC METABOLIC PANEL
BUN: 18 mg/dL (ref 6–23)
Creatinine, Ser: 1.18 mg/dL (ref 0.50–1.35)
GFR calc Af Amer: 63 mL/min — ABNORMAL LOW (ref >90)
GFR calc non Af Amer: 54 mL/min — ABNORMAL LOW (ref >90)
Glucose, Bld: 94 mg/dL (ref 70–99)
Potassium: 4.2 mEq/L (ref 3.5–5.1)

## 2013-01-08 MED ORDER — IOHEXOL 350 MG/ML SOLN
60.0000 mL | Freq: Once | INTRAVENOUS | Status: AC | PRN
  Administered 2013-01-08: 60 mL via INTRAVENOUS

## 2013-01-08 NOTE — Progress Notes (Signed)
Physical Therapy Treatment Patient Details Name: [name] [name] MRN: [id number] DOB: [birth year] Today's Date: 01/08/2013 Time: 0865-7846 PT Time Calculation (min): 23 min  PT Assessment / Plan / Recommendation Comments on Treatment Session  Pt not consistently progressing with mobility and is definitely safer with RW than SPC. Needed mod A to ambulate with cane and was able to ambulate 150' with min A with RW. Lethargy causing pt to be slower and less safe but anticipate this being as issue at home too. Pt will need 24 hr supervision upon initial d/c home. PT will continue to follow.    Follow Up Recommendations  Home health PT;Supervision/Assistance - 24 hour     Does the patient have the potential to tolerate intense rehabilitation     Barriers to Discharge        Equipment Recommendations  None recommended by PT    Recommendations for Other Services    Frequency Min 3X/week   Plan Discharge plan remains appropriate;Frequency remains appropriate    Precautions / Restrictions Precautions Precautions: Fall Restrictions Weight Bearing Restrictions: No   Pertinent Vitals/Pain Right sided neck discomfort, massage given to right trapezius and sternocleidomastoid as well as contract/ relax stretch. Pt left with heat on right sided neck.    Mobility  Bed Mobility Bed Mobility: Not assessed Transfers Transfers: Sit to Stand;Stand to Sit Sit to Stand: 4: Min assist;From chair/3-in-1;With upper extremity assist Stand to Sit: 4: Min assist;To chair/3-in-1;With upper extremity assist Details for Transfer Assistance: pt needed min A likely due to lethargy, min A given in sitting only to control descent Ambulation/Gait Ambulation/Gait Assistance: 4: Min assist Ambulation Distance (Feet): 150 Feet Assistive device: Rolling walker;Straight cane Ambulation/Gait Assistance Details: Began with straight cane but pt very unsteady, requiring mod A and flexed, rotated posture. After 10', gave  him RW which improved gait remarkably. Pt still tended to flex the trunk but straightened with vc's. Pt holds head in left lateral flexion, right shoulder elevated. Again urged pt that he really needs to be using RW for ambulation for safety despite that he feels he can use cane. Wife in agreement re: RW.  Gait Pattern: Shuffle;Trunk flexed Gait velocity: decreased Stairs: No Wheelchair Mobility Wheelchair Mobility: No    Exercises     PT Diagnosis:    PT Problem List:   PT Treatment Interventions:     PT Goals Acute Rehab PT Goals PT Goal Formulation: With patient Time For Goal Achievement: 01/19/13 Potential to Achieve Goals: Good Pt will go Supine/Side to Sit: Independently PT Goal: Supine/Side to Sit - Progress: Progressing toward goal Pt will go Sit to Stand: Independently;with upper extremity assist PT Goal: Sit to Stand - Progress: Progressing toward goal Pt will Ambulate: 51 - 150 feet;with modified independence;with least restrictive assistive device PT Goal: Ambulate - Progress: Progressing toward goal Pt will Go Up / Down Stairs: 1-2 stairs;with supervision;with least restrictive assistive device PT Goal: Up/Down Stairs - Progress: Progressing toward goal  Visit Information  Last PT Received On: 01/08/13 Assistance Needed: +1 PT/OT Co-Evaluation/Treatment: Yes    Subjective Data  Subjective: pt very sleepy at first, CPAP on upon arrival, pt up in chair and wife present Patient Stated Goal: return home   Cognition  Cognition Overall Cognitive Status: History of cognitive impairments - at baseline Arousal/Alertness: Awake/alert Orientation Level: Appears intact for tasks assessed Behavior During Session: Lethargic    Balance  Balance Balance Assessed: Yes Dynamic Standing Balance Dynamic Standing - Balance Support: Right upper extremity supported;During functional  activity Dynamic Standing - Level of Assistance: 3: Mod assist  End of Session PT - End of  Session Equipment Utilized During Treatment: Gait belt Activity Tolerance: Patient limited by pain;Patient limited by fatigue Patient left: in chair;with call bell/phone within reach;with family/visitor present Nurse Communication: Mobility status   GP   Lyanne Co, PT  Acute Rehab Services  [phone]   Lyanne Co 01/08/2013, 1:17 PM

## 2013-01-08 NOTE — Progress Notes (Signed)
Placed pt. On CPAP via full face mask, auto titrate setting (min 6.0 max 20.0) with 1 lpm O2 bleed in.  Pt. Tolerating well at this time. RN aware.

## 2013-01-08 NOTE — Progress Notes (Signed)
Patient alone in his room, does not seem to comprehend situation with his known polyp and heme positive stools and rationale for possible repeat colonoscopy and/or surgery. I found his mentation similar yesterday. I called his wife to see if they made a decision about colonoscopy. She said that he makes his own medical decisions and I told her I was not completely comfortable with this given that he continues to insist and reply to most of my statements that he was told he never needed surgery. We decided to me at the bedside tomorrow morning and hopefully resolve the issue of whether or not to perform colonoscopy and if none will do it on the next day.

## 2013-01-08 NOTE — Progress Notes (Signed)
Patient placed on auto titrating CPAP.  Low pressure starts at 6 cmH20 High pressure is at 20 CMH2O.  Patient tolerating well at this time.  RT will continue to monitor.

## 2013-01-08 NOTE — Progress Notes (Signed)
[facility] PROGRESS NOTE  [name] [name] ZOX:[id number] DOB: [birth year] DOA: 01/04/2013 PCP: [name] Garnet., MD  Assessment/Plan: 1. Anemia: HB was found to be 5.0, MCV 71.1. FOBT was positive. Likely due to chronic blood loss/iron deficiency in setting  of Coumadin-induced coagulopathy. Transfused a total of 4 units PRBC and HB is 9.2 today.  Dr Madilyn Fireman to speak with wife 3-06 for colonoscopy consent.  2. Fall: This was secondary to dizziness from profound anemia. Patient sustained a posterior scalp hematoma/laceration. 3. Hypertension: Continue with Norvasc and lasix. 4. Personal history of DVT (deep vein thrombosis)/PE: Patient was on chronic Coumadin anticoagulation, pre-admission, and INR was 2.77 on presentation. Coumadin is on hold, pending endoscopic evaluation. 5. OSA:  nocturnal CPAP. 6.Right leg pain, chest pain: will order CT angio and LE doppler rule out clot. If positive will need to discussed with patient regarding starting heparin, risk for bleed and consideration for IVC filter.    Code Status: DNR. Family Communication:  Wife at bedside, care discussed with her.  Disposition Plan: SNF vs Home with 24 hour assistance.    Consultants:  Dr Madilyn Fireman.  Procedures:  none  Antibiotics: none  HPI/Subjective: Complaining of new onset right LE pain and mild swelling. Complaining also of right side chest pain, ribs pain.   Objective: Filed Vitals:   01/08/13 0602 01/08/13 0941 01/08/13 1100 01/08/13 1445  BP: 136/68 104/69  128/68  Pulse: 72 73  76  Temp: 98.4 F (36.9 C)   98.6 F (37 C)  TempSrc: Oral   Oral  Resp: 20   20  Height:      Weight:      SpO2: 95%  98% 98%    Intake/Output Summary (Last 24 hours) at 01/08/13 1817 Last data filed at 01/08/13 1300  Gross per 24 hour  Intake    620 ml  Output    450 ml  Net    170 ml   Filed Weights   01/05/13 0005  Weight: 87.4 kg (192 lb 10.9 oz)    Exam:   General:  No  distress.  Cardiovascular: S 1, S 2 RRR  Respiratory: CTA  Abdomen: BS present, soft, NT  Musculoskeletal: Right lower extremity edema.   Data Reviewed: Basic Metabolic Panel:  Recent Labs Lab 01/04/13 1811 01/05/13 0554 01/07/13 0610 01/08/13 0640  NA 142 139 138 140  K 4.1 4.0 4.0 4.2  CL 104 105 101 104  CO2  --  27 30 26   GLUCOSE 92 81 102* 94  BUN 14 12 16 18   CREATININE 1.30 1.14 1.14 1.18  CALCIUM  --  9.0 9.3 9.1  MG  --  1.9  --   --   PHOS  --  3.0  --   --    Liver Function Tests:  Recent Labs Lab 01/05/13 0554  AST 15  ALT 9  ALKPHOS 46  BILITOT 0.8  PROT 5.9*  ALBUMIN 2.9*   No results found for this basename: LIPASE, AMYLASE,  in the last 168 hours No results found for this basename: AMMONIA,  in the last 168 hours CBC:  Recent Labs Lab 01/04/13 1730  01/05/13 1221 01/06/13 0440 01/06/13 1138 01/07/13 0610 01/08/13 0640  WBC 5.2  < > 6.9 6.6 7.1 7.3 6.9  NEUTROABS 3.3  --   --   --   --   --   --   HGB 5.0*  < > 8.2* 7.1* 7.5* 9.8* 9.2*  HCT 16.7*  < >  25.4* 22.6* 23.4* 29.4* 28.0*  MCV 71.1*  < > 71.5* 72.7* 72.4* 74.2* 73.1*  PLT 173  < > 161 181 188 177 172  < > = values in this interval not displayed. Cardiac Enzymes:  Recent Labs Lab 01/05/13 0032 01/05/13 0554 01/05/13 1221  TROPONINI <0.30 <0.30 <0.30   BNP (last 3 results) No results found for this basename: PROBNP,  in the last 8760 hours CBG:  Recent Labs Lab 01/04/13 1654  GLUCAP 90    Recent Results (from the past 240 hour(s))  MRSA PCR SCREENING     Status: None   Collection Time    01/04/13 11:35 PM      Result Value Range Status   MRSA by PCR NEGATIVE  NEGATIVE Final   Comment:            The GeneXpert MRSA Assay (FDA     approved for NASAL specimens     only), is one component of a     comprehensive MRSA colonization     surveillance program. It is not     intended to diagnose MRSA     infection nor to guide or     monitor treatment for      MRSA infections.     Studies: No results found.  Scheduled Meds: . amLODipine  10 mg Oral Daily  . aspirin  81 mg Oral Daily  . atorvastatin  40 mg Oral q1800  . docusate sodium  100 mg Oral BID  . donepezil  10 mg Oral QHS  . folic acid  2 mg Oral Daily  . furosemide  20 mg Oral Daily  . metoprolol tartrate  25 mg Oral BID  . potassium chloride SA  10 mEq Oral Daily  . sertraline  200 mg Oral Daily  . sodium chloride  3 mL Intravenous Q12H  . sodium chloride  3 mL Intravenous Q12H   Continuous Infusions:   Active Problems:   Anemia   Hypertension   Dizziness and giddiness   Personal history of DVT (deep vein thrombosis)    Time spent: 25 minutes.    [name] [name]  [facility] Pager [phone]. If 7PM-7AM, please contact night-coverage at [email or web address], password [facility] 01/08/2013, 6:17 PM  LOS: 4 days

## 2013-01-08 NOTE — Progress Notes (Signed)
Occupational Therapy Treatment Patient Details Name: [name] [name] MRN: [id number] DOB: Aug 14, 1925 Today's Date: 01/08/2013 Time: 4098-1191 OT Time Calculation (min): 36 min  OT Assessment / Plan / Recommendation Comments on Treatment Session This 79 yo male presenting at same level as he did on eval. WIll need HHOT as well as 24 S/A.    Follow Up Recommendations  SNF (or HHOT with 24 S/A)       Equipment Recommendations   (side tub rail --gave wife info on where to get)       Frequency Min 2X/week   Plan Discharge plan remains appropriate    Precautions / Restrictions Precautions Precautions: Fall Restrictions Weight Bearing Restrictions: No       ADL  Lower Body Dressing: Performed;Min guard (with increased time) Toilet Transfer: Simulated;Min guard Toilet Transfer Method: Sit to Barista:  (from recliner, out into hallway, back to room to recliner) Equipment Used: Gait belt;Rolling walker Transfers/Ambulation Related to ADLs: min guard A sit to stand and stand to sit, Min A for ambulation with RW, Mod A with SPC ADL Comments: Bends forward to get to feet for socks, cannot cross his legs     OT Goals  Pt remains at same level as he was on eval  Visit Information  Last OT Received On: 01/08/13 Assistance Needed: +1 PT/OT Co-Evaluation/Treatment: Yes (pt lethargic to start)          Cognition  Cognition Overall Cognitive Status: History of cognitive impairments - at baseline Arousal/Alertness: Awake/alert Orientation Level: Appears intact for tasks assessed Behavior During Session: Lethargic    Mobility  Bed Mobility Bed Mobility: Not assessed Transfers Sit to Stand: 4: Min guard;With upper extremity assist;With armrests;From chair/3-in-1 Stand to Sit: 4: Min guard;With upper extremity assist;With armrests;To chair/3-in-1 Details for Transfer Assistance: pt needed min A likely due to lethargy, min A given in sitting only to control  descent       Balance Balance Balance Assessed: Yes Dynamic Standing Balance Dynamic Standing - Balance Support: Right upper extremity supported;During functional activity Dynamic Standing - Level of Assistance: 3: Mod assist   End of Session OT - End of Session Equipment Utilized During Treatment: Gait belt Activity Tolerance: Patient tolerated treatment well Patient left: in chair;with call bell/phone within reach;with bed alarm set       Evette Georges [phone] 01/08/2013, 3:10 PM

## 2013-01-09 DIAGNOSIS — M79609 Pain in unspecified limb: Secondary | ICD-10-CM

## 2013-01-09 MED ORDER — HALOPERIDOL LACTATE 5 MG/ML IJ SOLN
0.5000 mg | Freq: Once | INTRAMUSCULAR | Status: AC
  Administered 2013-01-10: 0.5 mg via INTRAVENOUS
  Filled 2013-01-09: qty 0.1

## 2013-01-09 MED ORDER — SODIUM CHLORIDE 0.9 % IV SOLN
INTRAVENOUS | Status: DC
  Administered 2013-01-09: 20 mL/h via INTRAVENOUS

## 2013-01-09 MED ORDER — PEG 3350-KCL-NA BICARB-NACL 420 G PO SOLR
4000.0000 mL | Freq: Once | ORAL | Status: AC
  Administered 2013-01-09: 4000 mL via ORAL
  Filled 2013-01-09: qty 4000

## 2013-01-09 NOTE — Progress Notes (Signed)
[facility] PROGRESS NOTE  [name] [name] ZOX:[id number] DOB: 04/08/1925 DOA: 01/04/2013 PCP: [name] Garnet., MD  Assessment/Plan: 1. Anemia: HB was found to be 5.0, MCV 71.1. FOBT was positive. Likely due to chronic blood loss/iron deficiency in setting  of Coumadin-induced coagulopathy. Transfused a total of 4 units PRBC and HB is 9.2 .  Colonoscopy probably 3-7 /?   2. Fall: This was secondary to dizziness from profound anemia. SNF vs home with 24 hour supervision. 3. Hypertension: Continue with Norvasc and lasix. 4. Personal history of DVT (deep vein thrombosis)/PE: Patient was on chronic Coumadin anticoagulation, pre-admission, and INR was 2.77 on presentation. Coumadin is on hold, pending endoscopic evaluation. 5. OSA:  nocturnal CPAP. 6.Right leg pain, chest pain: CT angio negative for PE. Doppler pending.  Code Status: DNR. Family Communication:  Care discussed with patient.  Disposition Plan: SNF vs Home with 24 hour assistance.    Consultants:  Dr [name] [name].  Procedures:  none  Antibiotics: none  HPI/Subjective: No complaints today. Feeling well.   Objective: Filed Vitals:   01/08/13 2139 01/09/13 0033 01/09/13 0600 01/09/13 1408  BP:  127/78 160/83 146/80  Pulse: 72  65 77  Temp:   97.7 F (36.5 C) 97.6 F (36.4 C)  TempSrc:   Oral Oral  Resp: 14  18 18   Height:      Weight:      SpO2: 100%  96% 100%    Intake/Output Summary (Last 24 hours) at 01/09/13 1427 Last data filed at 01/09/13 0847  Gross per 24 hour  Intake    440 ml  Output    300 ml  Net    140 ml   Filed Weights   01/05/13 0005  Weight: 87.4 kg (192 lb 10.9 oz)    Exam:   General:  No distress.  Cardiovascular: S 1, S 2 RRR  Respiratory: CTA  Abdomen: BS present, soft, NT  Musculoskeletal: Right lower extremity edema.   Data Reviewed: Basic Metabolic Panel:  Recent Labs Lab 01/04/13 1811 01/05/13 0554 01/07/13 0610 01/08/13 0640  NA 142 139 138 140  K 4.1  4.0 4.0 4.2  CL 104 105 101 104  CO2  --  27 30 26   GLUCOSE 92 81 102* 94  BUN 14 12 16 18   CREATININE 1.30 1.14 1.14 1.18  CALCIUM  --  9.0 9.3 9.1  MG  --  1.9  --   --   PHOS  --  3.0  --   --    Liver Function Tests:  Recent Labs Lab 01/05/13 0554  AST 15  ALT 9  ALKPHOS 46  BILITOT 0.8  PROT 5.9*  ALBUMIN 2.9*   No results found for this basename: LIPASE, AMYLASE,  in the last 168 hours No results found for this basename: AMMONIA,  in the last 168 hours CBC:  Recent Labs Lab 01/04/13 1730  01/05/13 1221 01/06/13 0440 01/06/13 1138 01/07/13 0610 01/08/13 0640  WBC 5.2  < > 6.9 6.6 7.1 7.3 6.9  NEUTROABS 3.3  --   --   --   --   --   --   HGB 5.0*  < > 8.2* 7.1* 7.5* 9.8* 9.2*  HCT 16.7*  < > 25.4* 22.6* 23.4* 29.4* 28.0*  MCV 71.1*  < > 71.5* 72.7* 72.4* 74.2* 73.1*  PLT 173  < > 161 181 188 177 172  < > = values in this interval not displayed. Cardiac Enzymes:  Recent Labs Lab 01/05/13 0032  01/05/13 0554 01/05/13 1221  TROPONINI <0.30 <0.30 <0.30   BNP (last 3 results) No results found for this basename: PROBNP,  in the last 8760 hours CBG:  Recent Labs Lab 01/04/13 1654  GLUCAP 90    Recent Results (from the past 240 hour(s))  MRSA PCR SCREENING     Status: None   Collection Time    01/04/13 11:35 PM      Result Value Range Status   MRSA by PCR NEGATIVE  NEGATIVE Final   Comment:            The GeneXpert MRSA Assay (FDA     approved for NASAL specimens     only), is one component of a     comprehensive MRSA colonization     surveillance program. It is not     intended to diagnose MRSA     infection nor to guide or     monitor treatment for     MRSA infections.     Studies: Ct Angio Chest Pe W/cm &/or Wo Cm  01/08/2013  *RADIOLOGY REPORT*  Clinical Data: Chest pain, right leg pain, history of DVT and pulmonary embolism.  CT ANGIOGRAPHY CHEST  Technique:  Multidetector CT imaging of the chest using the standard protocol during bolus  administration of intravenous contrast. Multiplanar reconstructed images including MIPs were obtained and reviewed to evaluate the vascular anatomy.  Contrast: 60mL OMNIPAQUE IOHEXOL 350 MG/ML SOLN  Comparison: None.  Findings: There is good contrast opacification of the pulmonary artery branches.  No discrete filling defect to suggest acute PE.Patient breathing during the acquisition degrades some of the images. Adequate contrast opacification of the thoracic aorta with no evidence of dissection, aneurysm, or stenosis. There is classic 3-vessel brachiocephalic arch anatomy.  Previous CABG and AVR. Atheromatous aorta.  No hilar or mediastinal adenopathy.  No pleural or pericardial effusion.  Consolidation / atelectasis posteriorly in both lower lobes, right greater than left. Focal pleural-based consolidation laterally in the right  lower lobe, which was seen on an abdomen CT from 11/23/2009.  Coarse fibrosis and scarring in the anterior right upper lobe.  Spondylitic changes throughout the thoracic spine.  IMPRESSION: 1.  Negative for acute PE or thoracic aortic dissection. 2.  Chronic-appearing pulmonary parenchymal changes as above. 3. Previous CABG and AVR.   Original Report Authenticated By: D. Andria Rhein, MD     Scheduled Meds: . amLODipine  10 mg Oral Daily  . aspirin  81 mg Oral Daily  . atorvastatin  40 mg Oral q1800  . docusate sodium  100 mg Oral BID  . donepezil  10 mg Oral QHS  . folic acid  2 mg Oral Daily  . furosemide  20 mg Oral Daily  . metoprolol tartrate  25 mg Oral BID  . polyethylene glycol-electrolytes  4,000 mL Oral Once  . potassium chloride SA  10 mEq Oral Daily  . sertraline  200 mg Oral Daily  . sodium chloride  3 mL Intravenous Q12H   Continuous Infusions: . sodium chloride      Active Problems:   Anemia   Hypertension   Dizziness and giddiness   Personal history of DVT (deep vein thrombosis)    Time spent: 25 minutes.    [name] [name]  [facility] Pager [phone]. If 7PM-7AM, please contact night-coverage at [email or web address], password [facility] 01/09/2013, 2:27 PM  LOS: 5 days

## 2013-01-09 NOTE — Progress Notes (Signed)
VASCULAR LAB PRELIMINARY  PRELIMINARY  PRELIMINARY  PRELIMINARY  Right lower extremity venous duplex completed.    Preliminary report:  Right:  No evidence of DVT, superficial thrombosis, or Baker's cyst.  CESTONE, [name] [name], RVT 01/09/2013, 4:11 PM

## 2013-01-09 NOTE — Progress Notes (Signed)
1600 Patient refused to sign consent form today stated he will sign form tomorrow morning. Wife and family member are at bedside.

## 2013-01-10 LAB — BASIC METABOLIC PANEL
GFR calc non Af Amer: 59 mL/min — ABNORMAL LOW (ref >90)
Glucose, Bld: 91 mg/dL (ref 70–99)
Potassium: 4.2 mEq/L (ref 3.5–5.1)
Sodium: 137 mEq/L (ref 135–145)

## 2013-01-10 LAB — CBC
Hemoglobin: 9.2 g/dL — ABNORMAL LOW (ref 13.0–17.0)
MCHC: 32.3 g/dL (ref 30.0–36.0)
RBC: 3.8 MIL/uL — ABNORMAL LOW (ref 4.22–5.81)

## 2013-01-10 LAB — GLUCOSE, CAPILLARY: Glucose-Capillary: 94 mg/dL (ref 70–99)

## 2013-01-10 MED ORDER — PEG 3350-KCL-NA BICARB-NACL 420 G PO SOLR
4000.0000 mL | Freq: Once | ORAL | Status: AC
  Administered 2013-01-10: 4000 mL via ORAL
  Filled 2013-01-10: qty 4000

## 2013-01-10 MED ORDER — SODIUM CHLORIDE 0.9 % IV SOLN: INTRAVENOUS | Status: DC

## 2013-01-10 NOTE — Progress Notes (Signed)
PT Cancellation Note  Patient Details Name: [name] [name] MRN: [id number] DOB: [birth year]   Cancelled Treatment:    Reason Eval/Treat Not Completed: Other (comment) (colonoscopy prep)   MAYCOCK,CARY 01/10/2013, 4:13 PM

## 2013-01-10 NOTE — Progress Notes (Signed)
[facility] PROGRESS NOTE  [name] [name] ZOX:[id number] DOB: [birth year] DOA: 01/04/2013 PCP: Alva Garnet., MD  Assessment/Plan: 1. Anemia: HB was found to be 5.0, MCV 71.1. FOBT was positive. Likely due to chronic blood loss/iron deficiency in setting  of Coumadin-induced coagulopathy. Transfused a total of 4 units PRBC and HB is 9.2 .  Colonoscopy now delay , patient didn't take prep. Didn't sign consent.   2. Fall: This was secondary to dizziness from profound anemia. SNF vs home with 24 hour supervision. 3. Hypertension: Continue with Norvasc and lasix. 4. Personal history of DVT (deep vein thrombosis)/PE: Patient was on chronic Coumadin anticoagulation, pre-admission, and INR was 2.77 on presentation. Coumadin is on hold, pending endoscopic evaluation. 5. OSA:  nocturnal CPAP. 6.Right leg pain, chest pain: CT angio negative for PE. Doppler negative.  7.Encephlopathy: likely medication, haldol. Later in the afternoon patient more awake, per nurse report.   Code Status: DNR. Family Communication: no family at bedside.  Disposition Plan: SNF vs Home with 24 hour assistance.    Consultants:  Dr Madilyn Fireman.  Procedures:  none  Antibiotics: none  HPI/Subjective: Patient sleepy today. recieved haldol overnight.    Objective: Filed Vitals:   01/10/13 0512 01/10/13 1012 01/10/13 1021 01/10/13 1400  BP: 157/85 170/81  126/74  Pulse: 71 85  68  Temp: 97.9 F (36.6 C)  98.7 F (37.1 C) 97.7 F (36.5 C)  TempSrc: Oral   Oral  Resp: 19   16  Height:      Weight:      SpO2: 96%       Intake/Output Summary (Last 24 hours) at 01/10/13 1537 Last data filed at 01/10/13 0800  Gross per 24 hour  Intake    100 ml  Output    600 ml  Net   -500 ml   Filed Weights   01/05/13 0005  Weight: 87.4 kg (192 lb 10.9 oz)    Exam:   General:  No distress.  Cardiovascular: S 1, S 2 RRR  Respiratory: CTA  Abdomen: BS present, soft, NT  Musculoskeletal: Right lower  extremity edema.   Data Reviewed: Basic Metabolic Panel:  Recent Labs Lab 01/04/13 1811 01/05/13 0554 01/07/13 0610 01/08/13 0640 01/10/13 0610  NA 142 139 138 140 137  K 4.1 4.0 4.0 4.2 4.2  CL 104 105 101 104 101  CO2  --  27 30 26 29   GLUCOSE 92 81 102* 94 91  BUN 14 12 16 18 16   CREATININE 1.30 1.14 1.14 1.18 1.11  CALCIUM  --  9.0 9.3 9.1 9.4  MG  --  1.9  --   --   --   PHOS  --  3.0  --   --   --    Liver Function Tests:  Recent Labs Lab 01/05/13 0554  AST 15  ALT 9  ALKPHOS 46  BILITOT 0.8  PROT 5.9*  ALBUMIN 2.9*   No results found for this basename: LIPASE, AMYLASE,  in the last 168 hours No results found for this basename: AMMONIA,  in the last 168 hours CBC:  Recent Labs Lab 01/04/13 1730  01/06/13 0440 01/06/13 1138 01/07/13 0610 01/08/13 0640 01/10/13 0610  WBC 5.2  < > 6.6 7.1 7.3 6.9 8.8  NEUTROABS 3.3  --   --   --   --   --   --   HGB 5.0*  < > 7.1* 7.5* 9.8* 9.2* 9.2*  HCT 16.7*  < > 22.6* 23.4* 29.4*  28.0* 28.5*  MCV 71.1*  < > 72.7* 72.4* 74.2* 73.1* 75.0*  PLT 173  < > 181 188 177 172 174  < > = values in this interval not displayed. Cardiac Enzymes:  Recent Labs Lab 01/05/13 0032 01/05/13 0554 01/05/13 1221  TROPONINI <0.30 <0.30 <0.30   BNP (last 3 results) No results found for this basename: PROBNP,  in the last 8760 hours CBG:  Recent Labs Lab 01/04/13 1654 01/09/13 2356  GLUCAP 90 94    Recent Results (from the past 240 hour(s))  MRSA PCR SCREENING     Status: None   Collection Time    01/04/13 11:35 PM      Result Value Range Status   MRSA by PCR NEGATIVE  NEGATIVE Final   Comment:            The GeneXpert MRSA Assay (FDA     approved for NASAL specimens     only), is one component of a     comprehensive MRSA colonization     surveillance program. It is not     intended to diagnose MRSA     infection nor to guide or     monitor treatment for     MRSA infections.     Studies: Ct Angio Chest Pe W/cm  &/or Wo Cm  01/08/2013  *RADIOLOGY REPORT*  Clinical Data: Chest pain, right leg pain, history of DVT and pulmonary embolism.  CT ANGIOGRAPHY CHEST  Technique:  Multidetector CT imaging of the chest using the standard protocol during bolus administration of intravenous contrast. Multiplanar reconstructed images including MIPs were obtained and reviewed to evaluate the vascular anatomy.  Contrast: 60mL OMNIPAQUE IOHEXOL 350 MG/ML SOLN  Comparison: None.  Findings: There is good contrast opacification of the pulmonary artery branches.  No discrete filling defect to suggest acute PE.Patient breathing during the acquisition degrades some of the images. Adequate contrast opacification of the thoracic aorta with no evidence of dissection, aneurysm, or stenosis. There is classic 3-vessel brachiocephalic arch anatomy.  Previous CABG and AVR. Atheromatous aorta.  No hilar or mediastinal adenopathy.  No pleural or pericardial effusion.  Consolidation / atelectasis posteriorly in both lower lobes, right greater than left. Focal pleural-based consolidation laterally in the right  lower lobe, which was seen on an abdomen CT from 11/23/2009.  Coarse fibrosis and scarring in the anterior right upper lobe.  Spondylitic changes throughout the thoracic spine.  IMPRESSION: 1.  Negative for acute PE or thoracic aortic dissection. 2.  Chronic-appearing pulmonary parenchymal changes as above. 3. Previous CABG and AVR.   Original Report Authenticated By: D. Andria Rhein, MD     Scheduled Meds: . amLODipine  10 mg Oral Daily  . aspirin  81 mg Oral Daily  . atorvastatin  40 mg Oral q1800  . docusate sodium  100 mg Oral BID  . donepezil  10 mg Oral QHS  . folic acid  2 mg Oral Daily  . furosemide  20 mg Oral Daily  . metoprolol tartrate  25 mg Oral BID  . polyethylene glycol-electrolytes  4,000 mL Oral Once  . potassium chloride SA  10 mEq Oral Daily  . sertraline  200 mg Oral Daily  . sodium chloride  3 mL Intravenous Q12H    Continuous Infusions: . sodium chloride 20 mL/hr (01/09/13 1512)  . sodium chloride      Active Problems:   Anemia   Hypertension   Dizziness and giddiness   Personal history of DVT (deep  vein thrombosis)    Time spent: 25 minutes.    Raelene Trew  [facility] Pager [phone]. If 7PM-7AM, please contact night-coverage at [email or web address], password [facility] 01/10/2013, 3:37 PM  LOS: 6 days

## 2013-01-10 NOTE — Progress Notes (Signed)
[facility] Progress Note  Subjective: Patient would not signed his consent for procedure and only drank half his prep with very little stool output so his colonoscopy was canceled  Objective: Vital signs in last 24 hours: Temp:  [97.9 F (36.6 C)-98.7 F (37.1 C)] 98.7 F (37.1 C) (03/07 1021) Pulse Rate:  [71-85] 85 (03/07 1012) Resp:  [18-19] 19 (03/07 0512) BP: (118-170)/(67-85) 170/81 mmHg (03/07 1012) SpO2:  [93 %-96 %] 96 % (03/07 0512) Weight change:    PE: Rare lethargic, family on room  Lab Results: Results for orders placed during the hospital encounter of 01/04/13 (from the past 24 hour(s))  GLUCOSE, CAPILLARY     Status: None   Collection Time    01/09/13 11:56 PM      Result Value Range   Glucose-Capillary 94  70 - 99 mg/dL  CBC     Status: Abnormal   Collection Time    01/10/13  6:10 AM      Result Value Range   WBC 8.8  4.0 - 10.5 K/uL   RBC 3.80 (*) 4.22 - 5.81 MIL/uL   Hemoglobin 9.2 (*) 13.0 - 17.0 g/dL   HCT 96.0 (*) 45.4 - 09.8 %   MCV 75.0 (*) 78.0 - 100.0 fL   MCH 24.2 (*) 26.0 - 34.0 pg   MCHC 32.3  30.0 - 36.0 g/dL   RDW 11.9 (*) 14.7 - 82.9 %   Platelets 174  150 - 400 K/uL  BASIC METABOLIC PANEL     Status: Abnormal   Collection Time    01/10/13  6:10 AM      Result Value Range   Sodium 137  135 - 145 mEq/L   Potassium 4.2  3.5 - 5.1 mEq/L   Chloride 101  96 - 112 mEq/L   CO2 29  19 - 32 mEq/L   Glucose, Bld 91  70 - 99 mg/dL   BUN 16  6 - 23 mg/dL   Creatinine, Ser 5.62  0.50 - 1.35 mg/dL   Calcium 9.4  8.4 - 13.0 mg/dL   GFR calc non Af Amer 59 (*) >90 mL/min   GFR calc Af Amer 68 (*) >90 mL/min    Studies/Results: Ct Angio Chest Pe W/cm &/or Wo Cm  01/08/2013  *RADIOLOGY REPORT*  Clinical Data: Chest pain, right leg pain, history of DVT and pulmonary embolism.  CT ANGIOGRAPHY CHEST  Technique:  Multidetector CT imaging of the chest using the standard protocol during bolus administration of intravenous contrast.  Multiplanar reconstructed images including MIPs were obtained and reviewed to evaluate the vascular anatomy.  Contrast: 60mL OMNIPAQUE IOHEXOL 350 MG/ML SOLN  Comparison: None.  Findings: There is good contrast opacification of the pulmonary artery branches.  No discrete filling defect to suggest acute PE.Patient breathing during the acquisition degrades some of the images. Adequate contrast opacification of the thoracic aorta with no evidence of dissection, aneurysm, or stenosis. There is classic 3-vessel brachiocephalic arch anatomy.  Previous CABG and AVR. Atheromatous aorta.  No hilar or mediastinal adenopathy.  No pleural or pericardial effusion.  Consolidation / atelectasis posteriorly in both lower lobes, right greater than left. Focal pleural-based consolidation laterally in the right  lower lobe, which was seen on an abdomen CT from 11/23/2009.  Coarse fibrosis and scarring in the anterior right upper lobe.  Spondylitic changes throughout the thoracic spine.  IMPRESSION: 1.  Negative for acute PE or thoracic aortic dissection. 2.  Chronic-appearing pulmonary parenchymal changes as above. 3.  Previous CABG and AVR.   Original Report Authenticated By: D. Andria Rhein, MD       Assessment: Known advanced polyp from 2 years ago in the colon refusing surgery, now allowing reinspection intermittently but having difficulty getting to carry out the prep and plans to perform colonoscopy  Plan: Entire family and the recommendation the patient seemed to be in agreement to try again tomorrow. Will give the rest of his prep and make sure we obtain consent.      HAYES,JOHN C 01/10/2013, 2:14 PM

## 2013-01-11 ENCOUNTER — Encounter (HOSPITAL_COMMUNITY)
Admission: EM | Disposition: A | Discharge status: Home / Self Care | Payer: Self-pay | Source: Home / Self Care | Attending: Internal Medicine

## 2013-01-11 ENCOUNTER — Encounter (HOSPITAL_COMMUNITY): Payer: Self-pay

## 2013-01-11 ENCOUNTER — Inpatient Hospital Stay (HOSPITAL_COMMUNITY): Payer: Medicare Other

## 2013-01-11 DIAGNOSIS — D490 Neoplasm of unspecified behavior of digestive system: Secondary | ICD-10-CM

## 2013-01-11 HISTORY — PX: COLONOSCOPY: SHX5424

## 2013-01-11 SURGERY — COLONOSCOPY
Anesthesia: Moderate Sedation

## 2013-01-11 MED ORDER — IOHEXOL 300 MG/ML  SOLN
25.0000 mL | INTRAMUSCULAR | Status: AC
  Administered 2013-01-11 (×2): 25 mL via ORAL

## 2013-01-11 MED ORDER — SPOT INK MARKER SYRINGE KIT
PACK | SUBMUCOSAL | Status: AC
  Filled 2013-01-11: qty 5

## 2013-01-11 MED ORDER — MIDAZOLAM HCL 5 MG/5ML IJ SOLN
INTRAMUSCULAR | Status: DC | PRN
  Administered 2013-01-11 (×2): 1 mg via INTRAVENOUS

## 2013-01-11 MED ORDER — SODIUM CHLORIDE 0.9 % IV SOLN
INTRAVENOUS | Status: DC
  Administered 2013-01-11: 18:00:00 via INTRAVENOUS

## 2013-01-11 MED ORDER — FENTANYL CITRATE 0.05 MG/ML IJ SOLN
INTRAMUSCULAR | Status: DC | PRN
  Administered 2013-01-11: 25 ug via INTRAVENOUS

## 2013-01-11 MED ORDER — MIDAZOLAM HCL 5 MG/ML IJ SOLN
INTRAMUSCULAR | Status: AC
  Filled 2013-01-11: qty 3

## 2013-01-11 MED ORDER — FENTANYL CITRATE 0.05 MG/ML IJ SOLN
INTRAMUSCULAR | Status: AC
  Filled 2013-01-11: qty 2

## 2013-01-11 MED ORDER — IOHEXOL 300 MG/ML  SOLN
100.0000 mL | Freq: Once | INTRAMUSCULAR | Status: AC | PRN
  Administered 2013-01-11: 100 mL via INTRAVENOUS

## 2013-01-11 NOTE — Progress Notes (Signed)
Placed patient on CPAP at 6cm with oxygen set at 2lpm

## 2013-01-11 NOTE — Progress Notes (Signed)
Colonoscopy revealed ulcerated plaque like  mass one half circumferential the proximal transverse colon very suspicious for adenocarcinoma. Biopsies taken and tattooed with [place] ink. Will need evaluation for surgical candidacy

## 2013-01-11 NOTE — Progress Notes (Signed)
[facility] PROGRESS NOTE  [name] [name] ZOX:[id number] DOB: 1924/11/12 DOA: 01/04/2013 PCP: Alva Garnet., MD  Assessment/Plan:  1. Anemia: HB was found to be 5.0, MCV 71.1. FOBT was positive. Likely due to chronic blood loss/iron deficiency in setting  of Coumadin-induced coagulopathy. Transfused a total of 4 units PRBC and HB is 9.2 .  Colonoscopy 3-8 show ulcerated mass in the transverse colon. Await pathology report. Will order CT scan. Will need surgery evaluation. Will discussed with patient.   2. Fall: This was secondary to dizziness from profound anemia. SNF vs home with 24 hour supervision. 3. Hypertension: Continue with Norvasc and lasix. 4. Personal history of DVT (deep vein thrombosis)/PE: Patient was on chronic Coumadin anticoagulation, pre-admission, and INR was 2.77 on presentation. Coumadin is on hold, pending endoscopic evaluation. 5. OSA:  nocturnal CPAP. 6.Right leg pain, chest pain: CT angio negative for PE. Doppler negative.  7.Encephlopathy: likely medication, haldol. Resolved.   Code Status: DNR. Family Communication: granddaughter at bedside. Care discussed with her.  Disposition Plan: SNF vs Home with 24 hour assistance.    Consultants:  Dr Madilyn Fireman.  Procedures:  none  Antibiotics: none  HPI/Subjective: Patient awake, no complaints. For colonoscopy today.    Objective: Filed Vitals:   01/11/13 1215 01/11/13 1220 01/11/13 1225 01/11/13 1230  BP: 95/46 111/53 76/29 118/61  Pulse:      Temp:      TempSrc:      Resp: 10 14 17 15   Height:      Weight:      SpO2: 95% 96% 95% 96%    Intake/Output Summary (Last 24 hours) at 01/11/13 1249 Last data filed at 01/11/13 0800  Gross per 24 hour  Intake    760 ml  Output    202 ml  Net    558 ml   Filed Weights   01/05/13 0005  Weight: 87.4 kg (192 lb 10.9 oz)    Exam:   General:  No distress.  Cardiovascular: S 1, S 2 RRR  Respiratory: CTA  Abdomen: BS present, soft,  NT  Musculoskeletal: Right lower extremity edema.   Data Reviewed: Basic Metabolic Panel:  Recent Labs Lab 01/04/13 1811 01/05/13 0554 01/07/13 0610 01/08/13 0640 01/10/13 0610  NA 142 139 138 140 137  K 4.1 4.0 4.0 4.2 4.2  CL 104 105 101 104 101  CO2  --  27 30 26 29   GLUCOSE 92 81 102* 94 91  BUN 14 12 16 18 16   CREATININE 1.30 1.14 1.14 1.18 1.11  CALCIUM  --  9.0 9.3 9.1 9.4  MG  --  1.9  --   --   --   PHOS  --  3.0  --   --   --    Liver Function Tests:  Recent Labs Lab 01/05/13 0554  AST 15  ALT 9  ALKPHOS 46  BILITOT 0.8  PROT 5.9*  ALBUMIN 2.9*   No results found for this basename: LIPASE, AMYLASE,  in the last 168 hours No results found for this basename: AMMONIA,  in the last 168 hours CBC:  Recent Labs Lab 01/04/13 1730  01/06/13 0440 01/06/13 1138 01/07/13 0610 01/08/13 0640 01/10/13 0610  WBC 5.2  < > 6.6 7.1 7.3 6.9 8.8  NEUTROABS 3.3  --   --   --   --   --   --   HGB 5.0*  < > 7.1* 7.5* 9.8* 9.2* 9.2*  HCT 16.7*  < > 22.6* 23.4*  29.4* 28.0* 28.5*  MCV 71.1*  < > 72.7* 72.4* 74.2* 73.1* 75.0*  PLT 173  < > 181 188 177 172 174  < > = values in this interval not displayed. Cardiac Enzymes:  Recent Labs Lab 01/05/13 0032 01/05/13 0554 01/05/13 1221  TROPONINI <0.30 <0.30 <0.30   BNP (last 3 results) No results found for this basename: PROBNP,  in the last 8760 hours CBG:  Recent Labs Lab 01/04/13 1654 01/09/13 2356  GLUCAP 90 94    Recent Results (from the past 240 hour(s))  MRSA PCR SCREENING     Status: None   Collection Time    01/04/13 11:35 PM      Result Value Range Status   MRSA by PCR NEGATIVE  NEGATIVE Final   Comment:            The GeneXpert MRSA Assay (FDA     approved for NASAL specimens     only), is one component of a     comprehensive MRSA colonization     surveillance program. It is not     intended to diagnose MRSA     infection nor to guide or     monitor treatment for     MRSA infections.      Studies: No results found.  Scheduled Meds: . [MAR HOLD] amLODipine  10 mg Oral Daily  . [facility] HOLD] aspirin  81 mg Oral Daily  . [facility] HOLD] atorvastatin  40 mg Oral q1800  . [facility] HOLD] docusate sodium  100 mg Oral BID  . [MAR HOLD] donepezil  10 mg Oral QHS  . [facility] HOLD] folic acid  2 mg Oral Daily  . [facility] HOLD] furosemide  20 mg Oral Daily  . [facility] HOLD] metoprolol tartrate  25 mg Oral BID  . [MAR HOLD] potassium chloride SA  10 mEq Oral Daily  . [facility] HOLD] sertraline  200 mg Oral Daily  . [facility] HOLD] sodium chloride  3 mL Intravenous Q12H   Continuous Infusions: . sodium chloride 900 mL (01/10/13 1640)  . sodium chloride      Active Problems:   Anemia   Hypertension   Dizziness and giddiness   Personal history of DVT (deep vein thrombosis)    Time spent: 25 minutes.    [name] [name]  [facility] Pager [phone]. If 7PM-7AM, please contact night-coverage at [email or web address], password [facility] 01/11/2013, 12:49 PM  LOS: 7 days

## 2013-01-11 NOTE — Consult Note (Signed)
Reason for Consult: Colon mass in the transverse colon, hemoglobin of 5 on admit Referring Physician:  [name] is an 79 y.o. male.  HPI: Patient is a pleasant somewhat confused 79 year old gentleman who presented to the ER at [facility] with the falling and weakness. He was found to have a hemoglobin of 5 which required transfusion. He has a prior history of colonoscopy and EGD in January 2012 with some polyps in the transverse colon with some high-grade dysplasia. He is undergone repeat colonoscopy by Dr. Madilyn [name] which shows an ulcerated mass the transverse colon likely to be malignant. He describes a 4 cm ulcerated plaque-like mass consistent with carcinoma which was biopsied and then tattooed with [place] ink. We were asked to see the patient for possible for possible resection transverse colon mass.   Past Medical History  Diagnosis Date  . Hypertension   . Coronary artery disease/aortic Valve disease Aortic valve replacement using a 23-mm pericardial Magna tissue  valve, model 3000, serial number [id number].  2. Coronary artery bypass grafting x3 (saphenous vein graft to  posterior descending, saphenous vein graft to ramus intermediate,  saphenous vein graft to distal left anterior descending). 11/30/08 Dr. Donata [name]   cardiac catheterization 11/13/2008 shows severe aortic stenosis multivessel coronary artery disease, LVH    Hx DVT on coumadin    Hx of rectal bleeding on coumadin, 2010 and 11/23/10 Duodenal ulcer/edema consistent with duodenitis/Polpys and internal hemorrhoids on EGD/Colonoscopy 11/23/10    Hard of hearing    hyperlipidemia    Hx of prostate CA/radiation    Hx of sleep apnea    Body mass index is 28.4    Hypertension    Dementia      PMH:  CABG X 3 AND Aortic valve replacement 11/30/08 Dr. Kathlee Nations [name] Multiple tooth extractions,  and alveloplasty 11/25/08. Family History  Problem Relation Age of Onset  . Hypertension Brother     Social  History:  reports that he has never smoked. He does not have any smokeless tobacco history on file. He reports that he does not drink alcohol or use illicit drugs.  Married   Allergies: No Known Allergies  Medications:  Prior to Admission:  Prescriptions prior to admission  Medication Sig Dispense Refill  . amLODipine (NORVASC) 10 MG tablet Take 10 mg by mouth daily.      Marland Kitchen aspirin 81 MG tablet Take 81 mg by mouth daily.      Marland Kitchen donepezil (ARICEPT) 10 MG tablet Take 10 mg by mouth at bedtime.      . folic acid (FOLVITE) 1 MG tablet Take 2 mg by mouth daily.      . furosemide (LASIX) 20 MG tablet Take 20 mg by mouth daily.      . metoprolol tartrate (LOPRESSOR) 25 MG tablet Take 25 mg by mouth 2 (two) times daily.      . potassium chloride SA (K-DUR,KLOR-CON) 20 MEQ tablet Take 10 mEq by mouth daily.      . sertraline (ZOLOFT) 100 MG tablet Take 200 mg by mouth daily.      . simvastatin (ZOCOR) 80 MG tablet Take 40 mg by mouth at bedtime.      Marland Kitchen warfarin (COUMADIN) 5 MG tablet Take 2.5-5 mg by mouth daily. Take 5 mg 4 days a week. Take 2.5 mg 3 days of the week       Scheduled: . amLODipine  10 mg Oral Daily  . aspirin  81 mg Oral  Daily  . atorvastatin  40 mg Oral q1800  . docusate sodium  100 mg Oral BID  . donepezil  10 mg Oral QHS  . folic acid  2 mg Oral Daily  . furosemide  20 mg Oral Daily  . iohexol  25 mL Oral Q1 Hr x 2  . metoprolol tartrate  25 mg Oral BID  . potassium chloride SA  10 mEq Oral Daily  . sertraline  200 mg Oral Daily  . sodium chloride  3 mL Intravenous Q12H   Continuous: . sodium chloride 50 mL/hr at 01/11/13 1323   NWG:[id number], acetaminophen, HYDROcodone-acetaminophen, ondansetron (ZOFRAN) IV, ondansetron, sodium chloride Anti-infectives   None      Results for orders placed during the hospital encounter of 01/04/13 (from the past 48 hour(s))  GLUCOSE, CAPILLARY     Status: None   Collection Time    01/09/13 11:56 PM      Result Value Range    Glucose-Capillary 94  70 - 99 mg/dL  CBC     Status: Abnormal   Collection Time    01/10/13  6:10 AM      Result Value Range   WBC 8.8  4.0 - 10.5 K/uL   RBC 3.80 (*) 4.22 - 5.81 MIL/uL   Hemoglobin 9.2 (*) 13.0 - 17.0 g/dL   HCT 69.6 (*) 29.5 - 28.4 %   MCV 75.0 (*) 78.0 - 100.0 fL   MCH 24.2 (*) 26.0 - 34.0 pg   MCHC 32.3  30.0 - 36.0 g/dL   RDW 13.2 (*) 44.0 - 10.2 %   Platelets 174  150 - 400 K/uL  BASIC METABOLIC PANEL     Status: Abnormal   Collection Time    01/10/13  6:10 AM      Result Value Range   Sodium 137  135 - 145 mEq/L   Potassium 4.2  3.5 - 5.1 mEq/L   Chloride 101  96 - 112 mEq/L   CO2 29  19 - 32 mEq/L   Glucose, Bld 91  70 - 99 mg/dL   BUN 16  6 - 23 mg/dL   Creatinine, Ser 7.25  0.50 - 1.35 mg/dL   Calcium 9.4  8.4 - 36.6 mg/dL   GFR calc non Af Amer 59 (*) >90 mL/min   GFR calc Af Amer 68 (*) >90 mL/min   Comment:            The eGFR has been calculated     using the CKD EPI equation.     This calculation has not been     validated in all clinical     situations.     eGFR's persistently     <90 mL/min signify     possible Chronic Kidney Disease.    No results found.  Review of Systems  Unable to perform ROS: mental acuity  Constitutional: Negative.    Blood pressure 110/62, pulse 74, temperature 98 F (36.7 C), temperature source Oral, resp. rate 20, height 5\' 9"  (1.753 m), weight 192 lb 10.9 oz (87.4 kg), SpO2 100.00%. Physical Exam  Constitutional: He appears well-developed and well-nourished.  Elderly AA male in no acute distress reading paper  HENT:  Head: Normocephalic.  He has a dressing on the left side of his head.    Eyes: Conjunctivae and EOM are normal. Pupils are equal, round, and reactive to light. Right eye exhibits no discharge. Left eye exhibits no discharge. No scleral icterus.  Neck: Normal range of  motion. Neck supple. No JVD present. No tracheal deviation present. No thyromegaly present.  Cardiovascular: Normal rate  and regular rhythm.   Murmur (Aortic mumur) heard. Respiratory: Effort normal and breath sounds normal. No respiratory distress. He has no wheezes. He has no rales. He exhibits no tenderness.  Median sternotomy scar  GI: Soft. Bowel sounds are normal. He exhibits no distension and no mass. There is no tenderness. There is no rebound and no guarding.  Musculoskeletal: He exhibits edema (trace edema).  Lymphadenopathy:    He has no cervical adenopathy.  Neurological: He is alert. No cranial nerve deficit.  Skin: Skin is warm and dry. No erythema.  Scars lower legs for vein harvesting.  Psychiatric: He has a normal mood and affect.  He seems alert, he knows he's at [facility], but he couldn't tell me where he lives or who he lives with.  Year = 2004     Assessment/Plan: 1. Transverse colon mass with bleeding. Prior history of heme positive stools dating back to 2010. 2. History of multiple falls 3. Coronary disease status post CABG and aortic valve replacement. 4. History of DVT/PE is on Coumadin. 5. Hypertension 6. Hyperlipidemia 7. Sleep apnea 8. History of prostate cancer with radiation therapy. 9. Hard of hearing 10. Currently DO NOT RESUSCITATE 11. Possible history of dementia. 12. Severe Anemia from GI bleed on admit. 13. Confusion/dementia  Plan: Pathology is currently pending, we'll follow with you. He will need medical clearance for possible surgical resection of this colon mass.  We also need to talk with family and obtain informed consent. We will make more definitive plans after path is back.  Will Marlyne Beards PA-C for Dr. Almond Lint  [name] [name] 01/11/2013, 3:13 PM

## 2013-01-11 NOTE — Op Note (Signed)
[name] [name] [facility] [address] [place], [address]   COLONOSCOPY PROCEDURE REPORT  PATIENT: [name], [name]  MR#: [id number] BIRTHDATE: 12-Feb-1925 , 87  yrs. old GENDER: Male ENDOSCOPIST: Dorena Cookey, MD REFERRED BY: PROCEDURE DATE:  01/11/2013 PROCEDURE: ASA CLASS: INDICATIONS:  anemia and heme positive stool with known advanced polyp, patient declined surgery 2 years ago MEDICATIONS: fentanyl 25 mcg, Versed 2 mg  DESCRIPTION OF PROCEDURE: the Pentax videocolonoscope was inserted into the rectum and advanced to the cecum, confirmed by transillumination of McBurney's point in visualization of the ileocecal valve and appendiceal orifice. The prep was good. The cecum in a ascending colon appeared normal for no masses polyps diverticula or other mucosal abnormalities. Within the proximal transverse colon was seen a 4 cm ulcerated plaque like mass consistent with carcinoma. It was nonobstructing and was approximately 1/2 circumferential. Biopsies were taken and the surrounding mucosa localized with [place] ink. The remainder of the transverse descending sigmoid and rectum appeared normal and retroflexed view the anus revealed no obvious abnormalities     COMPLICATIONS: None  ENDOSCOPIC IMPRESSION:ulcerated mass in the transverse colon, likely malignant  RECOMMENDATIONS:await pathology and assess for surgical candidacy.    _______________________________ Rosalie DoctorDorena Cookey, MD 01/11/2013 12:31 PM

## 2013-01-11 NOTE — Consult Note (Signed)
Seen, agree with above.   Standard recommendation would be to provide surgery to resect portion of colon.  However, pt is DNR.  ? Understanding of situation.  Pt with unclear answers to questions.  Not sure if this reflects diminished capacity or decreased hearing.  Pt will need conversation with family prior to proceeding with colectomy.  Continue to hold anticoagulation.

## 2013-01-12 LAB — CBC
MCV: 74.6 fL — ABNORMAL LOW (ref 78.0–100.0)
Platelets: 193 10*3/uL (ref 150–400)
RBC: 4.25 MIL/uL (ref 4.22–5.81)
WBC: 8 10*3/uL (ref 4.0–10.5)

## 2013-01-12 MED ORDER — DSS 100 MG PO CAPS: 100.0000 mg | ORAL_CAPSULE | Freq: Two times a day (BID) | ORAL | Status: DC

## 2013-01-12 MED ORDER — HALOPERIDOL LACTATE 5 MG/ML IJ SOLN
0.5000 mg | Freq: Once | INTRAMUSCULAR | Status: AC
  Administered 2013-01-12: 0.5 mg via INTRAVENOUS
  Filled 2013-01-12: qty 0.1

## 2013-01-12 NOTE — Discharge Summary (Signed)
Physician Discharge Summary  [name] [name] RUE:[id number] DOB: July 17, 1925 DOA: 01/04/2013  PCP: Alva Garnet., MD  Admit date: 01/04/2013 Discharge date: 01/12/2013  Time spent: 35  minutes  Recommendations for Outpatient Follow-up:  1. Needs to follow up with oncologist, number provide to family. Will aslo route discharge summary to oncologist.  2. Needs to follow up with Dr Pablo Lawrence, family will call his office.    Discharge Diagnoses:    Anemia secondary to transverse colon mass.    Hypertension   Dizziness and giddiness   Personal history of DVT (deep vein thrombosis)   Transverse Colon Mass.    History of Aortic valve replacement, peritisue valve.   Discharge Condition: Stable.   Diet recommendation: Heart Healthy.   Filed Weights   01/05/13 0005  Weight: 87.4 kg (192 lb 10.9 oz)    History of present illness:  Patient admitted after a fall, he was found to have HB at 5. He was admitted for further work up.   Hospital Course:  1. Anemia: HB was found to be 5.0, MCV 71.1. FOBT was positive. Likely due to chronic blood loss/iron deficiency in setting of Coumadin-induced coagulopathy. Transfused a total of 4 units PRBC and HB is 9.2 .  Colonoscopy 3-8 show ulcerated mass in the transverse colon. Await pathology report.  CT scan show mucosal thickening transverse colon.  Hb stable.  2. Fall: This was secondary to dizziness from profound anemia. 3. Hypertension: Continue with Norvasc and lasix.  4. Personal history of DVT (deep vein thrombosis)/PE// tisue aortic valve: Patient was on chronic Coumadin anticoagulation, pre-admission, and INR was 2.77 on presentation. Patient 's family refuse IVC filter placement. They understand risk for Clot, stroke, and bleeding. They agree with holding coumadin for now. Wife will call Dr Gladstone Lighter office tomorrow to address coumadin use. They are not willing to stay in the hospital.  5. OSA: nocturnal CPAP.  6.Right leg pain, chest pain: CT  angio negative for PE. Doppler negative.  7.Encephlopathy: likely medication, haldol. Resolved.  8-Transverse Colon Mass: Pathology not available. Patient does not want to have surgery. Dr Johna Sheriff also spoke with patient and family. I explain to patient and family risk and benefit. Patient might consider chemotherapy. Family and patient are not willing to stay in hospital for final pathology report. I gave Dr Truett Perna office number for them to arrange follow up. I spoke with Welton Flakes, hematologist on call.  she said we can route discharge summary to Dr Truett Perna and Vicie Mutters and they will be able to arrange follow up. I will rout Discharge summary to Dr Welton Flakes also.    Procedures:  Colonoscopy: Transverse colon mass.   Consultations:  Dr Johna Sheriff  Dr Madilyn Fireman  Discharge Exam: Filed Vitals:   01/11/13 2154 01/11/13 2301 01/12/13 0602 01/12/13 1006  BP: 109/64  125/56 101/63  Pulse: 81 67 82 98  Temp: 98.8 F (37.1 C)  98.2 F (36.8 C)   TempSrc: Oral  Oral   Resp: 18 18 18    Height:      Weight:      SpO2: 99%  96%     General: No distress. Cardiovascular: S 1, S 2 RRR Respiratory: CTA  Discharge Instructions  Discharge Orders   Future Orders Complete By Expires     Diet - low sodium heart healthy  As directed     Diet - low sodium heart healthy  As directed     Increase activity slowly  As directed     Increase  activity slowly  As directed         Medication List    STOP taking these medications       amLODipine 10 MG tablet  Commonly known as:  NORVASC     aspirin 81 MG tablet     warfarin 5 MG tablet  Commonly known as:  COUMADIN      TAKE these medications       donepezil 10 MG tablet  Commonly known as:  ARICEPT  Take 10 mg by mouth at bedtime.     DSS 100 MG Caps  Take 100 mg by mouth 2 (two) times daily.     folic acid 1 MG tablet  Commonly known as:  FOLVITE  Take 2 mg by mouth daily.     furosemide 20 MG tablet  Commonly known as:  LASIX  Take  20 mg by mouth daily.     metoprolol tartrate 25 MG tablet  Commonly known as:  LOPRESSOR  Take 25 mg by mouth 2 (two) times daily.     potassium chloride SA 20 MEQ tablet  Commonly known as:  K-DUR,KLOR-CON  Take 10 mEq by mouth daily.     sertraline 100 MG tablet  Commonly known as:  ZOLOFT  Take 200 mg by mouth daily.     simvastatin 80 MG tablet  Commonly known as:  ZOCOR  Take 40 mg by mouth at bedtime.          The results of significant diagnostics from this hospitalization (including imaging, microbiology, ancillary and laboratory) are listed below for reference.    Significant Diagnostic Studies: Ct Head Wo Contrast  01/04/2013  **ADDENDUM** CREATED: 01/04/2013 18:19:41  Impression #1 should read: "Left scalp soft tissue injury WITHOUT underlying fracture."  **END ADDENDUM** SIGNED BY: Harley Hallmark, M.D.   01/04/2013  *RADIOLOGY REPORT*  Clinical Data: 79 year old male with vomiting, syncope, fall and head injury.  Posterior head laceration on the left.  On Coumadin.  CT HEAD WITHOUT CONTRAST  Technique:  Contiguous axial images were obtained from the base of the skull through the vertex without contrast.  Comparison: Brain MRI 03/06/2012 and earlier.  Findings: Minor ethmoid sinus mucosal thickening.  Other Visualized paranasal sinuses and mastoids are clear.  Left posterolateral convexity scalp hematoma and laceration with small volume of subcutaneous gas.  Hematoma measures up to 5 mm in thickness. Underlying left parietal bone intact.  No other acute scalp or orbit soft tissue findings.  Calcified atherosclerosis at the skull base.  Minimal cerebral volume loss since 2009.  No ventriculomegaly. No midline shift, mass effect, or evidence of mass lesion.  No acute intracranial hemorrhage identified.  No evidence of cortically based acute infarction identified.  No suspicious intracranial vascular hyperdensity.  IMPRESSION: 1.  Left scalp soft tissue injury and underlying  fracture. 2. Normal for age noncontrast CT appearance of the brain.   Original Report Authenticated By: Erskine Speed, M.D.    Ct Angio Chest Pe W/cm &/or Wo Cm  01/08/2013  *RADIOLOGY REPORT*  Clinical Data: Chest pain, right leg pain, history of DVT and pulmonary embolism.  CT ANGIOGRAPHY CHEST  Technique:  Multidetector CT imaging of the chest using the standard protocol during bolus administration of intravenous contrast. Multiplanar reconstructed images including MIPs were obtained and reviewed to evaluate the vascular anatomy.  Contrast: 60mL OMNIPAQUE IOHEXOL 350 MG/ML SOLN  Comparison: None.  Findings: There is good contrast opacification of the pulmonary artery branches.  No discrete  filling defect to suggest acute PE.Patient breathing during the acquisition degrades some of the images. Adequate contrast opacification of the thoracic aorta with no evidence of dissection, aneurysm, or stenosis. There is classic 3-vessel brachiocephalic arch anatomy.  Previous CABG and AVR. Atheromatous aorta.  No hilar or mediastinal adenopathy.  No pleural or pericardial effusion.  Consolidation / atelectasis posteriorly in both lower lobes, right greater than left. Focal pleural-based consolidation laterally in the right  lower lobe, which was seen on an abdomen CT from 11/23/2009.  Coarse fibrosis and scarring in the anterior right upper lobe.  Spondylitic changes throughout the thoracic spine.  IMPRESSION: 1.  Negative for acute PE or thoracic aortic dissection. 2.  Chronic-appearing pulmonary parenchymal changes as above. 3. Previous CABG and AVR.   Original Report Authenticated By: D. Andria Rhein, MD    Ct Abdomen Pelvis W Contrast  01/11/2013  *RADIOLOGY REPORT*  Clinical Data: Colonic mass  CT ABDOMEN AND PELVIS WITH CONTRAST  Technique:  Multidetector CT imaging of the abdomen and pelvis was performed following the standard protocol during bolus administration of intravenous contrast.  Contrast: OMNIPAQUE  IOHEXOL 300 MG/ML  SOLN  Comparison: CT 11/23/2009  Findings: Mural thickening is present in the proximal transverse colon which may correspond to the mass identified at colonoscopy. This is a nonobstructing mass.  The bowel is not dilated.  No regional adenopathy is identified.  Scarring and atelectasis in the lung bases.  Aortic valve replacement.  Liver and spleen are normal.  Gallbladder is distended and normal. Pancreas and kidneys are normal.  Atherosclerotic disease.  Abdominal aortic aneurysm measures 37 x 44 mm.  No free fluid.  No adenopathy.  Prostate seeds are noted for cancer treatment.  There is moderate degenerative change in both hip joints.  There is disc degeneration and spondylosis in the lower lumbar spine.  IMPRESSION: Mucosal thickening involving the proximal transverse colon which may represent the  carcinoma identified at colonoscopy.  No adenopathy or metastatic disease is identified.   Original Report Authenticated By: Janeece Riggers, M.D.     Microbiology: Recent Results (from the past 240 hour(s))  MRSA PCR SCREENING     Status: None   Collection Time    01/04/13 11:35 PM      Result Value Range Status   MRSA by PCR NEGATIVE  NEGATIVE Final   Comment:            The GeneXpert MRSA Assay (FDA     approved for NASAL specimens     only), is one component of a     comprehensive MRSA colonization     surveillance program. It is not     intended to diagnose MRSA     infection nor to guide or     monitor treatment for     MRSA infections.     Labs: Basic Metabolic Panel:  Recent Labs Lab 01/07/13 0610 01/08/13 0640 01/10/13 0610  NA 138 140 137  K 4.0 4.2 4.2  CL 101 104 101  CO2 30 26 29   GLUCOSE 102* 94 91  BUN 16 18 16   CREATININE 1.14 1.18 1.11  CALCIUM 9.3 9.1 9.4   Liver Function Tests: No results found for this basename: AST, ALT, ALKPHOS, BILITOT, PROT, ALBUMIN,  in the last 168 hours No results found for this basename: LIPASE, AMYLASE,  in the last  168 hours No results found for this basename: AMMONIA,  in the last 168 hours CBC:  Recent Labs Lab  01/06/13 0440 01/06/13 1138 01/07/13 0610 01/08/13 0640 01/10/13 0610  WBC 6.6 7.1 7.3 6.9 8.8  HGB 7.1* 7.5* 9.8* 9.2* 9.2*  HCT 22.6* 23.4* 29.4* 28.0* 28.5*  MCV 72.7* 72.4* 74.2* 73.1* 75.0*  PLT 181 188 177 172 174   Cardiac Enzymes:  Recent Labs Lab 01/05/13 1221  TROPONINI <0.30   BNP: BNP (last 3 results) No results found for this basename: PROBNP,  in the last 8760 hours CBG:  Recent Labs Lab 01/09/13 2356  GLUCAP 94       Signed:  Brazos Sandoval  [facility] 01/12/2013, 10:37 AM

## 2013-01-12 NOTE — Progress Notes (Addendum)
01/12/13 Patient has meeting with Dr. Sunnie Nielsen in room with family present to discuss his options for treatment. He refused to stay any longer after blood work and wants to go home . Family refused to make any decisions for him. Physician gave options but family want to take him home. Patient has been very agitated this AM and received Haldol to try to calm him down.Patient is to be discharged home today , IV site removed, discharge instructions given to family.

## 2013-01-12 NOTE — Progress Notes (Signed)
Patient ID: [name] [name], male   DOB: Dec 01, 1924, 80 y.o.   MRN: [id number]  Had discussion this morning with the patient's family including his wife and son and with the primary team. He has expressed to his family and has expressed previously that he does not want surgery for any reason. Therefore have elected to decline surgery. We discussed the nature of his problem and risks of bleeding and obstruction secondary to his tumor. We discussed that he may not be able to go back on blood thinners for heart valve due to risk of bleeding from the tumor. They understand all these issues and are comfortable with their decision. We will sign off and be available as needed.

## 2013-01-13 ENCOUNTER — Encounter (HOSPITAL_COMMUNITY): Payer: Self-pay | Admitting: Gastroenterology

## 2013-01-13 ENCOUNTER — Telehealth: Payer: Self-pay | Admitting: *Deleted

## 2013-01-13 NOTE — Telephone Encounter (Signed)
Spoke with patient's wife by phone and confirmed appointment for 01/31/13 with Dr. Truett Perna.   Contact names and phone numbers provided for [facility], Dr. Truett Perna, and this RN.

## 2013-01-16 ENCOUNTER — Telehealth: Payer: Self-pay | Admitting: Oncology

## 2013-01-16 NOTE — Telephone Encounter (Signed)
C/D 01/16/13 for appt. 01/31/13

## 2013-01-31 ENCOUNTER — Encounter: Payer: Self-pay | Admitting: Oncology

## 2013-01-31 ENCOUNTER — Ambulatory Visit (HOSPITAL_BASED_OUTPATIENT_CLINIC_OR_DEPARTMENT_OTHER): Payer: Medicare Other | Admitting: Oncology

## 2013-01-31 ENCOUNTER — Ambulatory Visit: Payer: Medicare Other

## 2013-01-31 ENCOUNTER — Telehealth: Payer: Self-pay | Admitting: Oncology

## 2013-01-31 ENCOUNTER — Ambulatory Visit (HOSPITAL_BASED_OUTPATIENT_CLINIC_OR_DEPARTMENT_OTHER): Payer: Medicare Other | Admitting: Lab

## 2013-01-31 VITALS — BP 141/87 | HR 73 | Temp 97.0°F | Resp 20 | Ht 65.0 in | Wt 180.0 lb

## 2013-01-31 DIAGNOSIS — D490 Neoplasm of unspecified behavior of digestive system: Secondary | ICD-10-CM

## 2013-01-31 DIAGNOSIS — Z86711 Personal history of pulmonary embolism: Secondary | ICD-10-CM

## 2013-01-31 DIAGNOSIS — D649 Anemia, unspecified: Secondary | ICD-10-CM

## 2013-01-31 DIAGNOSIS — K6389 Other specified diseases of intestine: Secondary | ICD-10-CM

## 2013-01-31 DIAGNOSIS — D500 Iron deficiency anemia secondary to blood loss (chronic): Secondary | ICD-10-CM

## 2013-01-31 DIAGNOSIS — Z8546 Personal history of malignant neoplasm of prostate: Secondary | ICD-10-CM

## 2013-01-31 LAB — CBC WITH DIFFERENTIAL/PLATELET
Basophils Absolute: 0.1 10*3/uL (ref 0.0–0.1)
EOS%: 1.8 % (ref 0.0–7.0)
Eosinophils Absolute: 0.1 10*3/uL (ref 0.0–0.5)
HGB: 8.6 g/dL — ABNORMAL LOW (ref 13.0–17.1)
LYMPH%: 28.5 % (ref 14.0–49.0)
MCH: 23.4 pg — ABNORMAL LOW (ref 27.2–33.4)
MCV: 74.5 fL — ABNORMAL LOW (ref 79.3–98.0)
MONO%: 10.9 % (ref 0.0–14.0)
Platelets: 163 10*3/uL (ref 140–400)
RBC: 3.69 10*6/uL — ABNORMAL LOW (ref 4.20–5.82)
RDW: 23.4 % — ABNORMAL HIGH (ref 11.0–14.6)

## 2013-01-31 NOTE — Progress Notes (Signed)
[facility] New Patient Consult   Referring MD: Kamen Hanken 79 y.o.  [birth year]    Reason for Referral: Colon mass     HPI: He was admitted on 01/04/2013 after a fall. He was noted to have a hemoglobin of 5 with an MCV of 71.1. He has a history of colon polyps and iron deficiency anemia. He is followed by [facility] gastroenterology. Dr. Madilyn Fireman was consulted. A colonoscopy on 01/11/2013. The cecum and a sending colon appeared normal. In the proximal transverse colon a 4 cm ulcerated plaque-like mass was seen consistent with a carcinoma. The mass was nonobstructing. Biopsies were obtained and the area was tattooed. The remainder of the transverse, descending, sigmoid, and rectum appeared normal. The pathology ([id number]) revealed ulcerated low-grade adenomatous dysplasia. No high-grade dysplasia or invasive malignancy was then applied.  He was transfused with packed red blood cells. Anticoagulation was discontinued. The surgical service was consult. He declined surgery.  Mr. Klahn denies bleeding, but his family reports he continues to have rectal bleeding.  Past Medical History  Diagnosis Date  . Hypertension   . Coronary artery disease   . MI (myocardial infarction)    .   History of aortic stenosis  .   Obstructive sleep apnea, maintained on CPAP  .   Hyperlipidemia  .  History of a right lower [place] deep vein thrombosis  .  History of prostate cancer treated with radiation seed implant therapy-followed by Dr. Brunilda Payor   Past Surgical History  Procedure Laterality Date  . Colonoscopy N/A 01/11/2013    Procedure: COLONOSCOPY;  Surgeon: Barrie Folk, MD;  Location: [facility] ENDOSCOPY;  Service: Endoscopy;  Laterality: N/A;   .     Coronary artery bypass grafting x3 and aortic valve replacement with a tissue valve      January 2010  Family History  Problem Relation Age of Onset  . Hypertension Brother    11 siblings and 3 children. No family history of  cancer.  Current outpatient prescriptions:docusate sodium 100 MG CAPS, Take 100 mg by mouth 2 (two) times daily., Disp: 10 capsule, Rfl: 0;  donepezil (ARICEPT) 10 MG tablet, Take 10 mg by mouth at bedtime., Disp: , Rfl: ;  folic acid (FOLVITE) 1 MG tablet, Take 2 mg by mouth daily., Disp: , Rfl: ;  furosemide (LASIX) 20 MG tablet, Take 20 mg by mouth daily., Disp: , Rfl:  iron polysaccharides (NIFEREX) 150 MG capsule, Take 150 mg by mouth 2 (two) times daily., Disp: , Rfl: ;  metoprolol tartrate (LOPRESSOR) 25 MG tablet, Take 25 mg by mouth 2 (two) times daily., Disp: , Rfl: ;  potassium chloride SA (K-DUR,KLOR-CON) 20 MEQ tablet, Take 10 mEq by mouth daily., Disp: , Rfl: ;  sertraline (ZOLOFT) 100 MG tablet, Take 200 mg by mouth daily., Disp: , Rfl:  simvastatin (ZOCOR) 80 MG tablet, Take 40 mg by mouth at bedtime., Disp: , Rfl:   Allergies: No Known Allergies  Social History: He is a retired Writer. He lives with his wife in [place]. No tobacco use. Remote history of alcohol use. No risk factor for HIV or hepatitis. He was in the [employer] in the [place] [employer].   ROS:   Positives include: Anorexia, weight loss, intermittent rectal bleeding  A complete ROS was otherwise negative.  Physical Exam:  Blood pressure 141/87, pulse 73, temperature 97 F (36.1 C), temperature source Oral, resp. rate 20, height 5\' 5"  (1.651 m), weight 180 lb (81.647  kg).  HEENT: Upper and lower denture plate, oropharynx without visible mass lesion, neck without mass Lungs: Clear bilaterally Cardiac: Regular rate and rhythm Abdomen: No hepatosplenomegaly, nontender, no mass GU: Testes without mass  Vascular: Edema at the right greater than left leg below the knee Lymph nodes: No cervical, supraclavicular, axillary, or inguinal node Neurologic: Hearing loss, follows commands, the motor exam appears intact in the upper and lower extremities Skin: No rash Musculoskeletal: No spine  tenderness   LAB:  CBC  Lab Results  Component Value Date   WBC 4.4 01/31/2013   HGB 8.6* 01/31/2013   HCT 27.5* 01/31/2013   MCV 74.5* 01/31/2013   PLT 163 01/31/2013     CMP      Component Value Date/Time   NA 137 01/10/2013 0610   K 4.2 01/10/2013 0610   CL 101 01/10/2013 0610   CO2 29 01/10/2013 0610   GLUCOSE 91 01/10/2013 0610   BUN 16 01/10/2013 0610   CREATININE 1.11 01/10/2013 0610   CALCIUM 9.4 01/10/2013 0610   PROT 5.9* 01/05/2013 0554   ALBUMIN 2.9* 01/05/2013 0554   AST 15 01/05/2013 0554   ALT 9 01/05/2013 0554   ALKPHOS 46 01/05/2013 0554   BILITOT 0.8 01/05/2013 0554   GFRNONAA 59* 01/10/2013 0610   GFRAA 68* 01/10/2013 0610   Hemoglobin 10.3, MCV 74.6 on 01/12/2013  Radiology: CT of the abdomen 01/11/2013-mucosal thickening at the proximal transverse colon, no adenopathy or metastatic disease    Assessment/Plan:   1. Ulcerated transverse colon mass-status post an endoscopic biopsy 01/11/2013 with the pathology revealing ulcerated low-grade adenomatous dysplasia  2. Microcytic anemia secondary to iron deficiency and bleeding from the colon mass, status post multiple red blood cell transfusions March 2014  3. History of prostate cancer  4. History of coronary artery disease and aortic stenosis, status post aortic valve replacement 2010  5. Hearing loss  6. Sleep apnea  7. History of deep vein thrombosis  Disposition:   He has a transverse colon mass with associated bleeding. A biopsy did not confirm invasive carcinoma, but the appearance of the mass is suggestive of colon cancer. I discussed the likely diagnosis and treatment options with Mr. Gluth and his family. He stated multiple times that he will not agreed to surgery.  We will present his case at the GI tumor conference within the next few weeks. He may be a candidate for a repeat biopsy to confirm invasive carcinoma. This could be followed by palliative radiation +/-Xeloda.  The hemoglobin has declined since  discharge from the hospital. He will return for a CBC in approximately 10 days with red cell transfusion support as indicated. Mr. Santillanes will return for an office visit in 3-4 weeks.  Ivelis Norgard 01/31/2013, 5:40 PM

## 2013-01-31 NOTE — Progress Notes (Signed)
Checked in new pt with no financial concerns. °

## 2013-01-31 NOTE — Telephone Encounter (Signed)
gve the pt's caregiver the April 2014 appt calendar

## 2013-02-03 ENCOUNTER — Ambulatory Visit: Payer: Medicare Other | Admitting: Lab

## 2013-02-10 ENCOUNTER — Other Ambulatory Visit: Payer: Self-pay | Admitting: *Deleted

## 2013-02-10 ENCOUNTER — Telehealth: Payer: Self-pay | Admitting: *Deleted

## 2013-02-10 DIAGNOSIS — D649 Anemia, unspecified: Secondary | ICD-10-CM

## 2013-02-10 NOTE — Telephone Encounter (Signed)
Sent urgent POF to scheduler to have labs done 4/7 or 4/8. Attempted to reach wife-line busy.

## 2013-02-10 NOTE — Telephone Encounter (Signed)
Wife left VM asking about follow up lab appointment MD had suggested to be done 10 days from last visit.

## 2013-02-11 ENCOUNTER — Other Ambulatory Visit: Payer: Self-pay | Admitting: *Deleted

## 2013-02-11 NOTE — Progress Notes (Signed)
Spoke with family member and scheduled his lab check for Thursday per patient request.

## 2013-02-12 ENCOUNTER — Telehealth: Payer: Self-pay | Admitting: Oncology

## 2013-02-12 ENCOUNTER — Other Ambulatory Visit: Payer: Medicare Other

## 2013-02-12 NOTE — Telephone Encounter (Signed)
Called pt last night left message for lab on 4/10 /14

## 2013-02-13 ENCOUNTER — Other Ambulatory Visit (HOSPITAL_BASED_OUTPATIENT_CLINIC_OR_DEPARTMENT_OTHER): Payer: Medicare Other | Admitting: Lab

## 2013-02-13 ENCOUNTER — Other Ambulatory Visit: Payer: Medicare Other | Admitting: Lab

## 2013-02-13 DIAGNOSIS — D649 Anemia, unspecified: Secondary | ICD-10-CM

## 2013-02-13 LAB — CBC WITH DIFFERENTIAL/PLATELET
BASO%: 0.8 % (ref 0.0–2.0)
EOS%: 3.2 % (ref 0.0–7.0)
Eosinophils Absolute: 0.2 10*3/uL (ref 0.0–0.5)
LYMPH%: 29.1 % (ref 14.0–49.0)
MCH: 24 pg — ABNORMAL LOW (ref 27.2–33.4)
MCHC: 31.4 g/dL — ABNORMAL LOW (ref 32.0–36.0)
MCV: 76.5 fL — ABNORMAL LOW (ref 79.3–98.0)
MONO%: 9.3 % (ref 0.0–14.0)
NEUT#: 2.9 10*3/uL (ref 1.5–6.5)
Platelets: 143 10*3/uL (ref 140–400)
RBC: 3.66 10*6/uL — ABNORMAL LOW (ref 4.20–5.82)
RDW: 23 % — ABNORMAL HIGH (ref 11.0–14.6)

## 2013-02-13 LAB — HOLD TUBE, BLOOD BANK

## 2013-02-17 ENCOUNTER — Telehealth: Payer: Self-pay | Admitting: *Deleted

## 2013-02-17 NOTE — Telephone Encounter (Signed)
Message copied by Wandalee Ferdinand on Mon Feb 17, 2013  9:35 AM ------      Message from: Thornton Papas B      Created: Thu Feb 13, 2013  9:41 PM       Please call patient, hb is stable, cont. Iron , f/u as scheduled ------

## 2013-02-17 NOTE — Telephone Encounter (Signed)
Left VM with CBC results and to continue iron bid. Follow up on 4/28 as scheduled.

## 2013-03-03 ENCOUNTER — Telehealth: Payer: Self-pay | Admitting: Oncology

## 2013-03-03 ENCOUNTER — Ambulatory Visit (HOSPITAL_BASED_OUTPATIENT_CLINIC_OR_DEPARTMENT_OTHER): Payer: Medicare Other | Admitting: Nurse Practitioner

## 2013-03-03 ENCOUNTER — Other Ambulatory Visit (HOSPITAL_BASED_OUTPATIENT_CLINIC_OR_DEPARTMENT_OTHER): Payer: Medicare Other | Admitting: Lab

## 2013-03-03 VITALS — BP 157/81 | HR 58 | Temp 97.2°F | Resp 20 | Ht 65.0 in | Wt 176.0 lb

## 2013-03-03 DIAGNOSIS — D490 Neoplasm of unspecified behavior of digestive system: Secondary | ICD-10-CM

## 2013-03-03 DIAGNOSIS — D509 Iron deficiency anemia, unspecified: Secondary | ICD-10-CM

## 2013-03-03 DIAGNOSIS — Z86718 Personal history of other venous thrombosis and embolism: Secondary | ICD-10-CM

## 2013-03-03 DIAGNOSIS — D649 Anemia, unspecified: Secondary | ICD-10-CM

## 2013-03-03 LAB — CBC WITH DIFFERENTIAL/PLATELET
BASO%: 1.2 % (ref 0.0–2.0)
EOS%: 2.8 % (ref 0.0–7.0)
HCT: 27.3 % — ABNORMAL LOW (ref 38.4–49.9)
MCH: 25.1 pg — ABNORMAL LOW (ref 27.2–33.4)
MCHC: 32.9 g/dL (ref 32.0–36.0)
MONO#: 0.5 10*3/uL (ref 0.1–0.9)
NEUT%: 48.1 % (ref 39.0–75.0)
RBC: 3.57 10*6/uL — ABNORMAL LOW (ref 4.20–5.82)
RDW: 25.7 % — ABNORMAL HIGH (ref 11.0–14.6)
WBC: 4.8 10*3/uL (ref 4.0–10.3)
lymph#: 1.8 10*3/uL (ref 0.9–3.3)

## 2013-03-03 NOTE — Telephone Encounter (Signed)
gv and priktned appt sched and avs for pt for May and June

## 2013-03-03 NOTE — Progress Notes (Signed)
OFFICE PROGRESS NOTE  Interval history:  Mr. Pauwels returns as scheduled. He is taking oral iron. He denies any bloody or black stools. No abdominal pain. Bowels moving regularly. No constipation. No nausea or vomiting. He has a good appetite.   Objective: Blood pressure 157/81, pulse 58, temperature 97.2 F (36.2 C), temperature source Oral, resp. rate 20, height 5\' 5"  (1.651 m), weight 176 lb (79.833 kg).  White coating over tongue. Lungs clear. Regular cardiac rhythm. Abdomen soft and nontender. No organomegaly. Edema at the right greater than left leg below the knee. Hearing deficit.  Lab Results: Lab Results  Component Value Date   WBC 4.8 03/03/2013   HGB 9.0* 03/03/2013   HCT 27.3* 03/03/2013   MCV 76.4* 03/03/2013   PLT 144 03/03/2013    Chemistry:    Chemistry      Component Value Date/Time   NA 137 01/10/2013 0610   K 4.2 01/10/2013 0610   CL 101 01/10/2013 0610   CO2 29 01/10/2013 0610   BUN 16 01/10/2013 0610   CREATININE 1.11 01/10/2013 0610      Component Value Date/Time   CALCIUM 9.4 01/10/2013 0610   ALKPHOS 46 01/05/2013 0554   AST 15 01/05/2013 0554   ALT 9 01/05/2013 0554   BILITOT 0.8 01/05/2013 0554       Studies/Results: No results found.  Medications: I have reviewed the patient's current medications.  Assessment/Plan:  1. Ulcerated transverse colon mass status post endoscopic biopsy 01/11/2013 with pathology revealing ulcerated low-grade adenomatous dysplasia. 2. Microcytic anemia secondary to iron deficiency and bleeding from the colon mass status post multiple red blood cell transfusions March 2014. He is now taking oral iron. 3. History of prostate cancer. 4. History of coronary artery disease and aortic stenosis status post aortic valve replacement 2010. 5. Hearing loss. 6. Sleep apnea. 7. History of deep vein thrombosis.  Disposition-Mr. Mehl's hemoglobin is stable. He will continue oral iron. Dr. Truett Perna presented his case at the GI cancer conference.  Options were felt to include surgery versus repeat colonoscopy with biopsy/possible cauterization. At present he is not interested in either of these options. His wife questioned if he was a candidate for chemotherapy and/or radiation. Dr. Truett Perna stated that he would need a biopsy proven diagnosis of cancer.  For now he will continue oral iron with routine CBCs. He will return for labs in 4 weeks and a followup visit in 8 weeks. He will contact the office in the interim with any problems or if he changes his mind regarding a referral to surgery or gastroenterology.  Patient was seen with Dr. Truett Perna.  Lonna Cobb ANP/GNP-BC

## 2013-04-01 ENCOUNTER — Other Ambulatory Visit (HOSPITAL_BASED_OUTPATIENT_CLINIC_OR_DEPARTMENT_OTHER): Payer: Medicare Other

## 2013-04-01 DIAGNOSIS — D649 Anemia, unspecified: Secondary | ICD-10-CM

## 2013-04-01 LAB — CBC WITH DIFFERENTIAL/PLATELET
BASO%: 0.5 % (ref 0.0–2.0)
Basophils Absolute: 0 10*3/uL (ref 0.0–0.1)
EOS%: 2.4 % (ref 0.0–7.0)
HCT: 29.2 % — ABNORMAL LOW (ref 38.4–49.9)
HGB: 9.2 g/dL — ABNORMAL LOW (ref 13.0–17.1)
MCH: 26.2 pg — ABNORMAL LOW (ref 27.2–33.4)
MONO#: 0.5 10*3/uL (ref 0.1–0.9)
NEUT%: 60.7 % (ref 39.0–75.0)
RDW: 21.8 % — ABNORMAL HIGH (ref 11.0–14.6)
WBC: 5.5 10*3/uL (ref 4.0–10.3)
lymph#: 1.5 10*3/uL (ref 0.9–3.3)

## 2013-04-29 ENCOUNTER — Ambulatory Visit (HOSPITAL_BASED_OUTPATIENT_CLINIC_OR_DEPARTMENT_OTHER): Payer: Medicare Other | Admitting: Oncology

## 2013-04-29 ENCOUNTER — Other Ambulatory Visit (HOSPITAL_BASED_OUTPATIENT_CLINIC_OR_DEPARTMENT_OTHER): Payer: Medicare Other | Admitting: Lab

## 2013-04-29 ENCOUNTER — Telehealth: Payer: Self-pay | Admitting: Oncology

## 2013-04-29 VITALS — BP 156/70 | HR 56 | Temp 97.6°F | Resp 20 | Ht 65.0 in | Wt 174.0 lb

## 2013-04-29 DIAGNOSIS — D649 Anemia, unspecified: Secondary | ICD-10-CM

## 2013-04-29 LAB — CBC WITH DIFFERENTIAL/PLATELET
BASO%: 1 % (ref 0.0–2.0)
Basophils Absolute: 0.1 10*3/uL (ref 0.0–0.1)
EOS%: 2.9 % (ref 0.0–7.0)
Eosinophils Absolute: 0.2 10*3/uL (ref 0.0–0.5)
HCT: 29.8 % — ABNORMAL LOW (ref 38.4–49.9)
HGB: 9.6 g/dL — ABNORMAL LOW (ref 13.0–17.1)
LYMPH%: 31.2 % (ref 14.0–49.0)
MCH: 28 pg (ref 27.2–33.4)
MCHC: 32.2 g/dL (ref 32.0–36.0)
MCV: 86.9 fL (ref 79.3–98.0)
MONO#: 0.5 10*3/uL (ref 0.1–0.9)
MONO%: 9.6 % (ref 0.0–14.0)
NEUT#: 2.9 10*3/uL (ref 1.5–6.5)
NEUT%: 55.3 % (ref 39.0–75.0)
Platelets: 125 10*3/uL — ABNORMAL LOW (ref 140–400)
RBC: 3.43 10*6/uL — ABNORMAL LOW (ref 4.20–5.82)
RDW: 16.7 % — ABNORMAL HIGH (ref 11.0–14.6)
WBC: 5.2 10*3/uL (ref 4.0–10.3)
lymph#: 1.6 10*3/uL (ref 0.9–3.3)
nRBC: 0 % (ref 0–0)

## 2013-04-29 NOTE — Telephone Encounter (Signed)
gv and printed appt sched and avs for pt  °

## 2013-04-29 NOTE — Progress Notes (Signed)
   [facility]    OFFICE PROGRESS NOTE   INTERVAL HISTORY:   He returns as scheduled. He is taking iron. No bleeding. He complains of pain at the right ear, neck, and shoulder after undergoing an ear cleaning at the [place]. He reports that your bled after the cleaning procedure.  Objective:  Vital signs in last 24 hours:  Blood pressure 156/70, pulse 56, temperature 97.6 F (36.4 C), temperature source Oral, resp. rate 20, height 5\' 5"  (1.651 m), weight 174 lb (78.926 kg).    HEENT: The right tympanic membrane and external canal are without bleeding or evidence of inflammation. Neck without mass Lymphatics: No cervical, supraclavicular, axillary, or inguinal nodes Resp: Lungs clear bilaterally Cardio: Regular rate and rhythm GI: No hepatomegaly, nontender Vascular: Edema with chronic stasis change at the right lower leg. Trace edema at the left lower leg   Lab Results:  Lab Results  Component Value Date   WBC 5.2 04/29/2013   HGB 9.6* 04/29/2013   HCT 29.8* 04/29/2013   MCV 86.9 04/29/2013   PLT 125 verified both analyzers* 04/29/2013      Medications: I have reviewed the patient's current medications.  Assessment/Plan:  1. Ulcerated transverse colon mass status post endoscopic biopsy 01/11/2013 with pathology revealing ulcerated low-grade adenomatous dysplasia. 2. Microcytic anemia secondary to iron deficiency and bleeding from the colon mass status post multiple red blood cell transfusions March 2014. He is now taking oral iron. The hemoglobin is stable. 3. History of prostate cancer. 4. History of coronary artery disease and aortic stenosis status post aortic valve replacement 2010. 5. Hearing loss. 6. Sleep apnea. 7. History of deep vein thrombosis. 8. Mild thrombocytopenia-stable-chronic     Disposition:  He is stable from a hematologic standpoint. He declines surgical intervention for the colon mass. Mr. Oki will continue iron. I recommended  he followup with his primary physician for evaluation of the neck, shoulder, and arm pain. He will return for a CBC in approximately 5 weeks. He is scheduled for an office visit in 10 weeks.   Thornton Papas, MD  04/29/2013  10:30 AM

## 2013-05-28 ENCOUNTER — Telehealth (HOSPITAL_COMMUNITY): Payer: Self-pay | Admitting: Cardiovascular Disease

## 2013-05-28 ENCOUNTER — Other Ambulatory Visit (HOSPITAL_COMMUNITY): Payer: Self-pay | Admitting: Cardiovascular Disease

## 2013-05-28 DIAGNOSIS — I714 Abdominal aortic aneurysm, without rupture: Secondary | ICD-10-CM

## 2013-06-03 ENCOUNTER — Telehealth: Payer: Self-pay | Admitting: *Deleted

## 2013-06-03 ENCOUNTER — Other Ambulatory Visit (HOSPITAL_BASED_OUTPATIENT_CLINIC_OR_DEPARTMENT_OTHER): Payer: Medicare Other

## 2013-06-03 DIAGNOSIS — D649 Anemia, unspecified: Secondary | ICD-10-CM

## 2013-06-03 LAB — CBC WITH DIFFERENTIAL/PLATELET
BASO%: 1 % (ref 0.0–2.0)
Basophils Absolute: 0 10*3/uL (ref 0.0–0.1)
Eosinophils Absolute: 0.2 10*3/uL (ref 0.0–0.5)
HCT: 30.6 % — ABNORMAL LOW (ref 38.4–49.9)
HGB: 10.2 g/dL — ABNORMAL LOW (ref 13.0–17.1)
MONO#: 0.5 10*3/uL (ref 0.1–0.9)
NEUT%: 56.5 % (ref 39.0–75.0)
Platelets: 151 10*3/uL (ref 140–400)
WBC: 4.4 10*3/uL (ref 4.0–10.3)
lymph#: 1.1 10*3/uL (ref 0.9–3.3)

## 2013-06-03 NOTE — Telephone Encounter (Signed)
Made aware that counts are improved. Blood bank cancelled. Follow up as scheduled.

## 2013-06-04 ENCOUNTER — Telehealth (HOSPITAL_COMMUNITY): Payer: Self-pay | Admitting: Cardiovascular Disease

## 2013-06-05 ENCOUNTER — Telehealth (HOSPITAL_COMMUNITY): Payer: Self-pay | Admitting: Cardiovascular Disease

## 2013-06-12 ENCOUNTER — Telehealth (HOSPITAL_COMMUNITY): Payer: Self-pay | Admitting: Cardiovascular Disease

## 2013-06-26 ENCOUNTER — Ambulatory Visit (HOSPITAL_COMMUNITY)
Admission: RE | Admit: 2013-06-26 | Discharge: 2013-06-26 | Disposition: A | Discharge status: Home / Self Care | Payer: Medicare Other | Source: Ambulatory Visit | Attending: Cardiovascular Disease | Admitting: Cardiovascular Disease

## 2013-06-26 DIAGNOSIS — I714 Abdominal aortic aneurysm, without rupture, unspecified: Secondary | ICD-10-CM | POA: Insufficient documentation

## 2013-06-26 HISTORY — PX: OTHER SURGICAL HISTORY: SHX169

## 2013-06-26 NOTE — Progress Notes (Signed)
Aorta Duplex Completed. Shynia Daleo, BS, RDMS, RVT  

## 2013-07-08 ENCOUNTER — Other Ambulatory Visit (HOSPITAL_BASED_OUTPATIENT_CLINIC_OR_DEPARTMENT_OTHER): Payer: Medicare Other

## 2013-07-08 ENCOUNTER — Telehealth: Payer: Self-pay

## 2013-07-08 ENCOUNTER — Ambulatory Visit (HOSPITAL_BASED_OUTPATIENT_CLINIC_OR_DEPARTMENT_OTHER): Payer: Medicare Other | Admitting: Nurse Practitioner

## 2013-07-08 VITALS — BP 136/76 | HR 89 | Temp 97.0°F | Resp 19 | Ht 65.0 in | Wt 164.2 lb

## 2013-07-08 DIAGNOSIS — D500 Iron deficiency anemia secondary to blood loss (chronic): Secondary | ICD-10-CM

## 2013-07-08 DIAGNOSIS — Z8546 Personal history of malignant neoplasm of prostate: Secondary | ICD-10-CM

## 2013-07-08 DIAGNOSIS — D649 Anemia, unspecified: Secondary | ICD-10-CM

## 2013-07-08 DIAGNOSIS — D696 Thrombocytopenia, unspecified: Secondary | ICD-10-CM

## 2013-07-08 LAB — CBC WITH DIFFERENTIAL/PLATELET
Basophils Absolute: 0.1 10*3/uL (ref 0.0–0.1)
Eosinophils Absolute: 0.3 10*3/uL (ref 0.0–0.5)
HCT: 28.7 % — ABNORMAL LOW (ref 38.4–49.9)
HGB: 9.6 g/dL — ABNORMAL LOW (ref 13.0–17.1)
MCV: 88 fL (ref 79.3–98.0)
MONO%: 10.5 % (ref 0.0–14.0)
NEUT#: 3.6 10*3/uL (ref 1.5–6.5)
NEUT%: 65.3 % (ref 39.0–75.0)
Platelets: 178 10*3/uL (ref 140–400)
RDW: 13.8 % (ref 11.0–14.6)

## 2013-07-08 NOTE — Progress Notes (Addendum)
OFFICE PROGRESS NOTE  Interval history:  Mr. Eller returns as scheduled. He denies abdominal pain. No rectal bleeding. Bowels moving regularly. He reports a good appetite. He has lost weight since his last visit. He attributes the weight loss to his diet being restricted. He has resumed a normal diet. He continues to have right ear and neck pain. He reports a negative x-ray evaluation.   Objective: Blood pressure 136/76, pulse 89, temperature 97 F (36.1 C), temperature source Oral, resp. rate 19, height 5\' 5"  (1.651 m), weight 164 lb 3.2 oz (74.481 kg).  Oropharynx is without thrush or ulceration. No palpable cervical, supraclavicular, axillary or inguinal lymph nodes. Lungs are clear. Regular cardiac rhythm. Abdomen soft and nontender. No organomegaly. Bilateral lower leg edema right slightly greater than left.  Lab Results: Lab Results  Component Value Date   WBC 5.5 07/08/2013   HGB 9.6* 07/08/2013   HCT 28.7* 07/08/2013   MCV 88.0 07/08/2013   PLT 178 07/08/2013    Chemistry:    Chemistry      Component Value Date/Time   NA 137 01/10/2013 0610   K 4.2 01/10/2013 0610   CL 101 01/10/2013 0610   CO2 29 01/10/2013 0610   BUN 16 01/10/2013 0610   CREATININE 1.11 01/10/2013 0610      Component Value Date/Time   CALCIUM 9.4 01/10/2013 0610   ALKPHOS 46 01/05/2013 0554   AST 15 01/05/2013 0554   ALT 9 01/05/2013 0554   BILITOT 0.8 01/05/2013 0554       Studies/Results: No results found.  Medications: I have reviewed the patient's current medications.  Assessment/Plan:  1. Ulcerated transverse colon mass status post endoscopic biopsy 01/11/2013 with pathology revealing ulcerated low-grade adenomatous dysplasia. 2. Microcytic anemia secondary to iron deficiency and bleeding from the colon mass status post multiple red blood cell transfusions March 2014. He is now taking oral iron. The hemoglobin is stable. 3. History of prostate cancer. 4. History of coronary artery disease and aortic stenosis  status post aortic valve replacement 2010. 5. Hearing loss. 6. Sleep apnea. 7. History of deep vein thrombosis. 8. Mild thrombocytopenia-stable-chronic. 9. Right ear and neck pain. He reports a recent negative x-ray evaluation. He will followup with his primary physician.  Disposition-Mr. Ketcham remains stable from a hematologic standpoint. We again discussed surgical intervention for the colon mass. He continues to decline any type of surgery. We also discussed a colonoscopy with biopsy. He declines this as well. He will return for a CBC in 6 weeks and a followup visit in 12 weeks. He will contact the office in the interim with any problems. We specifically discussed signs/symptoms suggestive of progressive anemia.  Patient seen with Dr. Truett Perna.  Lonna Cobb ANP/GNP-BC  This was a shared visit with Lonna Cobb. He continues iron and the hemoglobin stabilized. We again recommended he proceed with surgery. He declines surgery and a colonoscopy.he will continue iron.  Mancel Bale, M.D.

## 2013-07-08 NOTE — Telephone Encounter (Signed)
gv and printed appt sched and avs forpt for OCT and NOv

## 2013-08-19 ENCOUNTER — Encounter (INDEPENDENT_AMBULATORY_CARE_PROVIDER_SITE_OTHER): Payer: Self-pay

## 2013-08-19 ENCOUNTER — Other Ambulatory Visit (HOSPITAL_BASED_OUTPATIENT_CLINIC_OR_DEPARTMENT_OTHER): Payer: Medicare Other | Admitting: Lab

## 2013-08-19 DIAGNOSIS — D649 Anemia, unspecified: Secondary | ICD-10-CM

## 2013-08-19 LAB — CBC WITH DIFFERENTIAL/PLATELET
BASO%: 1.2 % (ref 0.0–2.0)
Eosinophils Absolute: 0.2 10*3/uL (ref 0.0–0.5)
HCT: 27.1 % — ABNORMAL LOW (ref 38.4–49.9)
LYMPH%: 25.3 % (ref 14.0–49.0)
MCHC: 32.4 g/dL (ref 32.0–36.0)
MONO#: 0.7 10*3/uL (ref 0.1–0.9)
NEUT%: 58.1 % (ref 39.0–75.0)
Platelets: 174 10*3/uL (ref 140–400)
WBC: 5.9 10*3/uL (ref 4.0–10.3)

## 2013-09-09 ENCOUNTER — Telehealth: Payer: Self-pay | Admitting: Cardiovascular Disease
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
## 2013-09-09 NOTE — Telephone Encounter (Signed)
Wife also wants pt to see Dr. Allyson Sabal as new cardiologist.  Informed Scheduling will be notified.
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
## 2013-09-12 ENCOUNTER — Telehealth: Payer: Self-pay | Admitting: *Deleted

## 2013-09-12 NOTE — Telephone Encounter (Signed)
Faxed order, office notes, demographic and insurance information to [facility] to mange patient's CPAP therapies and supplies.

## 2013-09-15 ENCOUNTER — Ambulatory Visit: Payer: Medicare Other | Admitting: Cardiovascular Disease

## 2013-09-24 ENCOUNTER — Encounter: Payer: Self-pay | Admitting: Cardiovascular Disease

## 2013-09-30 ENCOUNTER — Ambulatory Visit: Payer: Medicare Other | Admitting: Oncology

## 2013-09-30 ENCOUNTER — Other Ambulatory Visit: Payer: Medicare Other

## 2013-10-23 ENCOUNTER — Ambulatory Visit: Payer: Medicare Other | Admitting: Cardiovascular Disease

## 2013-12-04 ENCOUNTER — Telehealth (HOSPITAL_COMMUNITY): Payer: Self-pay | Admitting: *Deleted

## 2013-12-25 ENCOUNTER — Telehealth (HOSPITAL_COMMUNITY): Payer: Self-pay | Admitting: *Deleted

## 2014-01-07 ENCOUNTER — Ambulatory Visit: Payer: Medicare Other | Admitting: Cardiovascular Disease

## 2014-01-20 ENCOUNTER — Other Ambulatory Visit (HOSPITAL_COMMUNITY): Payer: Self-pay | Admitting: Pulmonary Disease

## 2014-01-20 DIAGNOSIS — R1310 Dysphagia, unspecified: Secondary | ICD-10-CM

## 2014-01-21 ENCOUNTER — Ambulatory Visit (HOSPITAL_COMMUNITY)
Admission: RE | Admit: 2014-01-21 | Discharge: 2014-01-21 | Disposition: A | Discharge status: Home / Self Care | Payer: Medicare Other | Source: Ambulatory Visit | Attending: Pulmonary Disease | Admitting: Pulmonary Disease

## 2014-01-21 DIAGNOSIS — K449 Diaphragmatic hernia without obstruction or gangrene: Secondary | ICD-10-CM | POA: Insufficient documentation

## 2014-01-21 DIAGNOSIS — R1310 Dysphagia, unspecified: Secondary | ICD-10-CM

## 2014-01-21 DIAGNOSIS — K224 Dyskinesia of esophagus: Secondary | ICD-10-CM | POA: Insufficient documentation

## 2014-02-13 ENCOUNTER — Encounter (HOSPITAL_COMMUNITY): Payer: Self-pay | Admitting: Emergency Medicine

## 2014-02-13 ENCOUNTER — Inpatient Hospital Stay (HOSPITAL_COMMUNITY)
Admission: EM | Admit: 2014-02-13 | Discharge: 2014-02-18 | DRG: 811 | Disposition: A | Discharge status: Home Health | Payer: Medicare Other | Attending: Family Medicine | Admitting: Family Medicine

## 2014-02-13 ENCOUNTER — Emergency Department (HOSPITAL_COMMUNITY): Payer: Medicare Other

## 2014-02-13 DIAGNOSIS — M25519 Pain in unspecified shoulder: Secondary | ICD-10-CM | POA: Diagnosis not present

## 2014-02-13 DIAGNOSIS — I10 Essential (primary) hypertension: Secondary | ICD-10-CM

## 2014-02-13 DIAGNOSIS — R42 Dizziness and giddiness: Secondary | ICD-10-CM

## 2014-02-13 DIAGNOSIS — Z79899 Other long term (current) drug therapy: Secondary | ICD-10-CM

## 2014-02-13 DIAGNOSIS — Z66 Do not resuscitate: Secondary | ICD-10-CM | POA: Diagnosis present

## 2014-02-13 DIAGNOSIS — I714 Abdominal aortic aneurysm, without rupture, unspecified: Secondary | ICD-10-CM | POA: Diagnosis present

## 2014-02-13 DIAGNOSIS — D630 Anemia in neoplastic disease: Principal | ICD-10-CM | POA: Diagnosis present

## 2014-02-13 DIAGNOSIS — Z7982 Long term (current) use of aspirin: Secondary | ICD-10-CM

## 2014-02-13 DIAGNOSIS — I129 Hypertensive chronic kidney disease with stage 1 through stage 4 chronic kidney disease, or unspecified chronic kidney disease: Secondary | ICD-10-CM | POA: Diagnosis present

## 2014-02-13 DIAGNOSIS — C189 Malignant neoplasm of colon, unspecified: Secondary | ICD-10-CM | POA: Diagnosis present

## 2014-02-13 DIAGNOSIS — F603 Borderline personality disorder: Secondary | ICD-10-CM | POA: Diagnosis present

## 2014-02-13 DIAGNOSIS — F1910 Other psychoactive substance abuse, uncomplicated: Secondary | ICD-10-CM | POA: Diagnosis present

## 2014-02-13 DIAGNOSIS — Z86718 Personal history of other venous thrombosis and embolism: Secondary | ICD-10-CM

## 2014-02-13 DIAGNOSIS — F0390 Unspecified dementia without behavioral disturbance: Secondary | ICD-10-CM | POA: Diagnosis present

## 2014-02-13 DIAGNOSIS — Z952 Presence of prosthetic heart valve: Secondary | ICD-10-CM

## 2014-02-13 DIAGNOSIS — Z951 Presence of aortocoronary bypass graft: Secondary | ICD-10-CM

## 2014-02-13 DIAGNOSIS — IMO0002 Reserved for concepts with insufficient information to code with codable children: Secondary | ICD-10-CM

## 2014-02-13 DIAGNOSIS — F22 Delusional disorders: Secondary | ICD-10-CM | POA: Diagnosis present

## 2014-02-13 DIAGNOSIS — I5033 Acute on chronic diastolic (congestive) heart failure: Secondary | ICD-10-CM | POA: Diagnosis present

## 2014-02-13 DIAGNOSIS — Z8249 Family history of ischemic heart disease and other diseases of the circulatory system: Secondary | ICD-10-CM

## 2014-02-13 DIAGNOSIS — R404 Transient alteration of awareness: Secondary | ICD-10-CM | POA: Diagnosis present

## 2014-02-13 DIAGNOSIS — R410 Disorientation, unspecified: Secondary | ICD-10-CM

## 2014-02-13 DIAGNOSIS — F29 Unspecified psychosis not due to a substance or known physiological condition: Secondary | ICD-10-CM | POA: Diagnosis present

## 2014-02-13 DIAGNOSIS — D649 Anemia, unspecified: Secondary | ICD-10-CM

## 2014-02-13 DIAGNOSIS — I509 Heart failure, unspecified: Secondary | ICD-10-CM | POA: Diagnosis present

## 2014-02-13 DIAGNOSIS — N179 Acute kidney failure, unspecified: Secondary | ICD-10-CM | POA: Diagnosis present

## 2014-02-13 DIAGNOSIS — F05 Delirium due to known physiological condition: Secondary | ICD-10-CM | POA: Diagnosis present

## 2014-02-13 DIAGNOSIS — I2510 Atherosclerotic heart disease of native coronary artery without angina pectoris: Secondary | ICD-10-CM | POA: Diagnosis present

## 2014-02-13 DIAGNOSIS — N182 Chronic kidney disease, stage 2 (mild): Secondary | ICD-10-CM | POA: Diagnosis present

## 2014-02-13 DIAGNOSIS — I252 Old myocardial infarction: Secondary | ICD-10-CM

## 2014-02-13 HISTORY — DX: Unspecified dementia, unspecified severity, without behavioral disturbance, psychotic disturbance, mood disturbance, and anxiety: F03.90

## 2014-02-13 LAB — CBC
HCT: 21.8 % — ABNORMAL LOW (ref 39.0–52.0)
HEMOGLOBIN: 6.8 g/dL — AB (ref 13.0–17.0)
MCH: 25 pg — AB (ref 26.0–34.0)
MCHC: 31.2 g/dL (ref 30.0–36.0)
MCV: 80.1 fL (ref 78.0–100.0)
Platelets: 254 10*3/uL (ref 150–400)
RBC: 2.72 MIL/uL — ABNORMAL LOW (ref 4.22–5.81)
RDW: 16.9 % — ABNORMAL HIGH (ref 11.5–15.5)
WBC: 5 10*3/uL (ref 4.0–10.5)

## 2014-02-13 LAB — COMPREHENSIVE METABOLIC PANEL
ALT: 16 U/L (ref 0–53)
AST: 22 U/L (ref 0–37)
Albumin: 3.2 g/dL — ABNORMAL LOW (ref 3.5–5.2)
Alkaline Phosphatase: 57 U/L (ref 39–117)
BUN: 13 mg/dL (ref 6–23)
CO2: 23 mEq/L (ref 19–32)
Calcium: 9.2 mg/dL (ref 8.4–10.5)
Chloride: 103 mEq/L (ref 96–112)
Creatinine, Ser: 1.26 mg/dL (ref 0.50–1.35)
GFR calc Af Amer: 58 mL/min — ABNORMAL LOW (ref >90)
GFR calc non Af Amer: 50 mL/min — ABNORMAL LOW (ref >90)
Glucose, Bld: 177 mg/dL — ABNORMAL HIGH (ref 70–99)
Potassium: 3.8 mEq/L (ref 3.7–5.3)
Sodium: 141 mEq/L (ref 137–147)
Total Bilirubin: 0.2 mg/dL — ABNORMAL LOW (ref 0.3–1.2)
Total Protein: 6.6 g/dL (ref 6.0–8.3)

## 2014-02-13 LAB — SALICYLATE LEVEL: Salicylate Lvl: <2 mg/dL — ABNORMAL LOW (ref 2.8–20.0)

## 2014-02-13 LAB — PROTIME-INR
INR: 1.05 (ref 0.00–1.49)
PROTHROMBIN TIME: 13.5 s (ref 11.6–15.2)

## 2014-02-13 LAB — RAPID URINE DRUG SCREEN, HOSP PERFORMED
Amphetamines: NOT DETECTED
Barbiturates: NOT DETECTED
Benzodiazepines: NOT DETECTED
COCAINE: NOT DETECTED
OPIATES: NOT DETECTED
Tetrahydrocannabinol: POSITIVE — AB

## 2014-02-13 LAB — URINALYSIS, ROUTINE W REFLEX MICROSCOPIC
BILIRUBIN URINE: NEGATIVE
GLUCOSE, UA: NEGATIVE mg/dL
Hgb urine dipstick: NEGATIVE
Ketones, ur: NEGATIVE mg/dL
Leukocytes, UA: NEGATIVE
Nitrite: NEGATIVE
Protein, ur: NEGATIVE mg/dL
SPECIFIC GRAVITY, URINE: 1.008 (ref 1.005–1.030)
UROBILINOGEN UA: 0.2 mg/dL (ref 0.0–1.0)
pH: 6.5 (ref 5.0–8.0)

## 2014-02-13 LAB — ABO/RH: ABO/RH(D): A POS

## 2014-02-13 LAB — RETICULOCYTES
RBC.: 2.73 MIL/uL — ABNORMAL LOW (ref 4.22–5.81)
RETIC COUNT ABSOLUTE: 71 10*3/uL (ref 19.0–186.0)
RETIC CT PCT: 2.6 % (ref 0.4–3.1)

## 2014-02-13 LAB — ETHANOL: Alcohol, Ethyl (B): <11 mg/dL (ref 0–11)

## 2014-02-13 LAB — ACETAMINOPHEN LEVEL: Acetaminophen (Tylenol), Serum: <15 ug/mL (ref 10–30)

## 2014-02-13 NOTE — ED Notes (Signed)
Patient taken back to [name]. Patient beligerent and per [name] will get changed after given medicine to calm pt down
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
## 2014-02-13 NOTE — ED Notes (Signed)
Critical HGb 6.8.

## 2014-02-13 NOTE — ED Notes (Signed)
Pt had family at the bedside and began to become angry and aggressive verbally.  Family was asked to leave in an attempt to calm pt down.  Introduced myself to pt and explained that he would need a blood transfusion d/t how low his HgB is.  Pt vehemently denies the need for blood stating, "all doctors are liars!"  [employer] are present at the bedside.
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
## 2014-02-13 NOTE — ED Notes (Signed)
Will do rectal temp when pt can be moved to room

## 2014-02-14 ENCOUNTER — Encounter (HOSPITAL_COMMUNITY): Payer: Self-pay | Admitting: *Deleted

## 2014-02-14 ENCOUNTER — Inpatient Hospital Stay (HOSPITAL_COMMUNITY): Payer: Medicare Other

## 2014-02-14 DIAGNOSIS — I059 Rheumatic mitral valve disease, unspecified: Secondary | ICD-10-CM

## 2014-02-14 DIAGNOSIS — F603 Borderline personality disorder: Secondary | ICD-10-CM

## 2014-02-14 DIAGNOSIS — F22 Delusional disorders: Secondary | ICD-10-CM

## 2014-02-14 DIAGNOSIS — F0390 Unspecified dementia without behavioral disturbance: Secondary | ICD-10-CM

## 2014-02-14 LAB — CBC WITH DIFFERENTIAL/PLATELET
Basophils Absolute: 0 10*3/uL (ref 0.0–0.1)
Basophils Relative: 1 % (ref 0–1)
Eosinophils Absolute: 0.1 10*3/uL (ref 0.0–0.7)
Eosinophils Relative: 2 % (ref 0–5)
HCT: 27.2 % — ABNORMAL LOW (ref 39.0–52.0)
Hemoglobin: 8.3 g/dL — ABNORMAL LOW (ref 13.0–17.0)
Lymphocytes Relative: 19 % (ref 12–46)
Lymphs Abs: 0.8 10*3/uL (ref 0.7–4.0)
MCH: 25.2 pg — ABNORMAL LOW (ref 26.0–34.0)
MCHC: 30.5 g/dL (ref 30.0–36.0)
MCV: 82.4 fL (ref 78.0–100.0)
Monocytes Absolute: 0.5 10*3/uL (ref 0.1–1.0)
Monocytes Relative: 12 % (ref 3–12)
Neutro Abs: 3 10*3/uL (ref 1.7–7.7)
Neutrophils Relative %: 67 % (ref 43–77)
Platelets: 215 10*3/uL (ref 150–400)
RBC: 3.3 MIL/uL — ABNORMAL LOW (ref 4.22–5.81)
RDW: 16.6 % — ABNORMAL HIGH (ref 11.5–15.5)
WBC: 4.4 10*3/uL (ref 4.0–10.5)

## 2014-02-14 LAB — COMPREHENSIVE METABOLIC PANEL
ALT: 16 U/L (ref 0–53)
AST: 23 U/L (ref 0–37)
Albumin: 3.2 g/dL — ABNORMAL LOW (ref 3.5–5.2)
Alkaline Phosphatase: 61 U/L (ref 39–117)
BILIRUBIN TOTAL: 0.3 mg/dL (ref 0.3–1.2)
BUN: 11 mg/dL (ref 6–23)
CO2: 30 meq/L (ref 19–32)
Calcium: 9.8 mg/dL (ref 8.4–10.5)
Chloride: 104 mEq/L (ref 96–112)
Creatinine, Ser: 1.09 mg/dL (ref 0.50–1.35)
GFR calc Af Amer: 69 mL/min — ABNORMAL LOW (ref >90)
GFR, EST NON AFRICAN AMERICAN: 59 mL/min — AB (ref >90)
GLUCOSE: 92 mg/dL (ref 70–99)
POTASSIUM: 3.5 meq/L — AB (ref 3.7–5.3)
SODIUM: 145 meq/L (ref 137–147)
Total Protein: 7.1 g/dL (ref 6.0–8.3)

## 2014-02-14 LAB — CBC
HCT: 33.8 % — ABNORMAL LOW (ref 39.0–52.0)
Hemoglobin: 10.7 g/dL — ABNORMAL LOW (ref 13.0–17.0)
MCH: 26.1 pg (ref 26.0–34.0)
MCHC: 31.7 g/dL (ref 30.0–36.0)
MCV: 82.4 fL (ref 78.0–100.0)
Platelets: 232 10*3/uL (ref 150–400)
RBC: 4.1 MIL/uL — ABNORMAL LOW (ref 4.22–5.81)
RDW: 16.1 % — AB (ref 11.5–15.5)
WBC: 6.2 10*3/uL (ref 4.0–10.5)

## 2014-02-14 LAB — IRON AND TIBC: UIBC: 330 ug/dL (ref 125–400)

## 2014-02-14 LAB — PREPARE RBC (CROSSMATCH)

## 2014-02-14 LAB — MAGNESIUM: Magnesium: 2.1 mg/dL (ref 1.5–2.5)

## 2014-02-14 LAB — VITAMIN B12: Vitamin B-12: 588 pg/mL (ref 211–911)

## 2014-02-14 LAB — FERRITIN: FERRITIN: 22 ng/mL (ref 22–322)

## 2014-02-14 LAB — TROPONIN I: Troponin I: <0.3 ng/mL (ref <0.30)

## 2014-02-14 LAB — PRO B NATRIURETIC PEPTIDE: Pro B Natriuretic peptide (BNP): 2143 pg/mL — ABNORMAL HIGH (ref 0–450)

## 2014-02-14 LAB — FOLATE: Folate: >20 ng/mL

## 2014-02-14 MED ORDER — FOLIC ACID 1 MG PO TABS
2.0000 mg | ORAL_TABLET | Freq: Every day | ORAL | Status: DC
  Administered 2014-02-15 – 2014-02-18 (×4): 2 mg via ORAL
  Filled 2014-02-14 (×5): qty 2

## 2014-02-14 MED ORDER — STERILE WATER FOR INJECTION IJ SOLN
INTRAMUSCULAR | Status: AC
  Filled 2014-02-14: qty 10

## 2014-02-14 MED ORDER — ASPIRIN EC 81 MG PO TBEC
81.0000 mg | DELAYED_RELEASE_TABLET | Freq: Every day | ORAL | Status: DC
  Filled 2014-02-14: qty 1

## 2014-02-14 MED ORDER — SODIUM CHLORIDE 0.9 % IV SOLN: 500.0000 mL | Freq: Once | INTRAVENOUS | Status: AC

## 2014-02-14 MED ORDER — AMLODIPINE BESYLATE 10 MG PO TABS
10.0000 mg | ORAL_TABLET | Freq: Every day | ORAL | Status: DC
  Administered 2014-02-15 – 2014-02-18 (×4): 10 mg via ORAL
  Filled 2014-02-14 (×5): qty 1

## 2014-02-14 MED ORDER — SERTRALINE HCL 100 MG PO TABS
200.0000 mg | ORAL_TABLET | Freq: Every day | ORAL | Status: DC
  Administered 2014-02-15 – 2014-02-18 (×4): 200 mg via ORAL
  Filled 2014-02-14 (×5): qty 2

## 2014-02-14 MED ORDER — RAMIPRIL 1.25 MG PO CAPS
1.2500 mg | ORAL_CAPSULE | Freq: Two times a day (BID) | ORAL | Status: DC
Start: 2014-02-14 — End: 2014-02-18
  Administered 2014-02-14 – 2014-02-18 (×7): 1.25 mg via ORAL
  Filled 2014-02-14 (×11): qty 1

## 2014-02-14 MED ORDER — SODIUM CHLORIDE 0.9 % IV SOLN
250.0000 mL | INTRAVENOUS | Status: DC | PRN
  Administered 2014-02-14: 250 mL via INTRAVENOUS

## 2014-02-14 MED ORDER — OLANZAPINE 5 MG PO TBDP
5.0000 mg | ORAL_TABLET | Freq: Every day | ORAL | Status: DC
  Administered 2014-02-14 – 2014-02-16 (×3): 5 mg via ORAL
  Filled 2014-02-14 (×5): qty 1

## 2014-02-14 MED ORDER — LORAZEPAM 2 MG/ML IJ SOLN
1.0000 mg | Freq: Once | INTRAMUSCULAR | Status: AC
  Administered 2014-02-14: 1 mg via INTRAMUSCULAR
  Filled 2014-02-14: qty 1

## 2014-02-14 MED ORDER — ZIPRASIDONE MESYLATE 20 MG IM SOLR
10.0000 mg | Freq: Once | INTRAMUSCULAR | Status: AC
  Administered 2014-02-14: 10 mg via INTRAMUSCULAR
  Filled 2014-02-14: qty 20

## 2014-02-14 MED ORDER — FUROSEMIDE 10 MG/ML IJ SOLN: 20.0000 mg | Freq: Once | INTRAMUSCULAR | Status: DC

## 2014-02-14 MED ORDER — METOPROLOL TARTRATE 25 MG PO TABS
25.0000 mg | ORAL_TABLET | Freq: Two times a day (BID) | ORAL | Status: DC
  Administered 2014-02-14 – 2014-02-18 (×7): 25 mg via ORAL
  Filled 2014-02-14 (×10): qty 1

## 2014-02-14 MED ORDER — LORAZEPAM 2 MG/ML IJ SOLN
0.5000 mg | Freq: Four times a day (QID) | INTRAMUSCULAR | Status: DC | PRN
  Administered 2014-02-17 – 2014-02-18 (×2): 0.5 mg via INTRAVENOUS
  Filled 2014-02-14 (×3): qty 1

## 2014-02-14 MED ORDER — FERROUS SULFATE 325 (65 FE) MG PO TABS
325.0000 mg | ORAL_TABLET | Freq: Every day | ORAL | Status: DC
  Administered 2014-02-15 – 2014-02-17 (×3): 325 mg via ORAL
  Filled 2014-02-14 (×6): qty 1

## 2014-02-14 MED ORDER — FUROSEMIDE 10 MG/ML IJ SOLN
40.0000 mg | Freq: Every day | INTRAMUSCULAR | Status: DC
  Administered 2014-02-14 – 2014-02-15 (×2): 40 mg via INTRAVENOUS
  Filled 2014-02-14 (×3): qty 4

## 2014-02-14 MED ORDER — ATORVASTATIN CALCIUM 40 MG PO TABS
40.0000 mg | ORAL_TABLET | Freq: Every day | ORAL | Status: DC
  Administered 2014-02-14 – 2014-02-17 (×4): 40 mg via ORAL
  Filled 2014-02-14 (×5): qty 1

## 2014-02-14 MED ORDER — DONEPEZIL HCL 10 MG PO TABS
10.0000 mg | ORAL_TABLET | Freq: Every day | ORAL | Status: DC
  Administered 2014-02-14 – 2014-02-16 (×3): 10 mg via ORAL
  Filled 2014-02-14 (×6): qty 1

## 2014-02-14 MED ORDER — ACETAMINOPHEN 325 MG PO TABS: 650.0000 mg | ORAL_TABLET | Freq: Once | ORAL | Status: DC

## 2014-02-14 MED ORDER — SODIUM CHLORIDE 0.9 % IJ SOLN
3.0000 mL | Freq: Two times a day (BID) | INTRAMUSCULAR | Status: DC
  Administered 2014-02-14 – 2014-02-17 (×6): 3 mL via INTRAVENOUS

## 2014-02-14 MED ORDER — DIPHENHYDRAMINE HCL 25 MG PO CAPS: 25.0000 mg | ORAL_CAPSULE | Freq: Once | ORAL | Status: DC

## 2014-02-14 MED ORDER — SODIUM CHLORIDE 0.9 % IJ SOLN: 3.0000 mL | INTRAMUSCULAR | Status: DC | PRN
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
## 2014-02-14 NOTE — Progress Notes (Signed)
Pt has slept all day, wakes for meals.  States he doesn't want to eat but ate when tech fed him.

## 2014-02-14 NOTE — Progress Notes (Signed)
Writer called at this time, and left phone number with ED staff for return call from nurse of this pt.

## 2014-02-14 NOTE — Progress Notes (Signed)
Writer held for report from ED nurse. Will await return call.

## 2014-02-14 NOTE — Progress Notes (Signed)
Writer unable to do most of history and education.

## 2014-02-14 NOTE — Progress Notes (Signed)
Pt to floor at 0250. Pt is sleeping with sitter at bedside. Arouses if touched. First unit of PRBC has now been started, per MD order.  Unable to give PO medications, as pt is too lethargic and pushes writer's hand away when trying to perform care. Monitoring continues.  Condom catheter applied for strict I&O.

## 2014-02-14 NOTE — ED Notes (Signed)
Spoke w/ Abigail Butts, RN from the floor to update her as to why the pt has not been transported upstairs.  Informed Abigail Butts that pt is refusing all care including IV access and cardiac monitoring and IVC papers needed to be obtained prior to initiating either of these.  Also spoke w/ Dr. Roel Cluck to obtain an order for IM ativan to enable this writer to insert IV and place pt on cardiac monitoring.  Informed Abigail Butts that once these tasks were finished pt would be transported to the floor w/ a sitter.

## 2014-02-14 NOTE — Progress Notes (Signed)
Noted pt had gotten chest x-ray.

## 2014-02-14 NOTE — Progress Notes (Signed)
Echocardiogram 2D Echocardiogram has been performed.  [name] [name] 02/14/2014, 2:52 PM
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
## 2014-02-15 ENCOUNTER — Inpatient Hospital Stay (HOSPITAL_COMMUNITY): Payer: Medicare Other

## 2014-02-15 LAB — CBC
HEMATOCRIT: 30.8 % — AB (ref 39.0–52.0)
Hemoglobin: 9.7 g/dL — ABNORMAL LOW (ref 13.0–17.0)
MCH: 25.9 pg — ABNORMAL LOW (ref 26.0–34.0)
MCHC: 31.5 g/dL (ref 30.0–36.0)
MCV: 82.1 fL (ref 78.0–100.0)
PLATELETS: 222 10*3/uL (ref 150–400)
RBC: 3.75 MIL/uL — ABNORMAL LOW (ref 4.22–5.81)
RDW: 16.4 % — AB (ref 11.5–15.5)
WBC: 6.7 10*3/uL (ref 4.0–10.5)

## 2014-02-15 LAB — TYPE AND SCREEN
ABO/RH(D): A POS
Antibody Screen: NEGATIVE
Unit division: 0
Unit division: 0

## 2014-02-15 LAB — BASIC METABOLIC PANEL
BUN: 14 mg/dL (ref 6–23)
CALCIUM: 9.2 mg/dL (ref 8.4–10.5)
CHLORIDE: 102 meq/L (ref 96–112)
CO2: 29 mEq/L (ref 19–32)
Creatinine, Ser: 1.14 mg/dL (ref 0.50–1.35)
GFR calc non Af Amer: 56 mL/min — ABNORMAL LOW (ref >90)
GFR, EST AFRICAN AMERICAN: 65 mL/min — AB (ref >90)
Glucose, Bld: 90 mg/dL (ref 70–99)
Potassium: 3.8 mEq/L (ref 3.7–5.3)
Sodium: 140 mEq/L (ref 137–147)
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
## 2014-02-16 DIAGNOSIS — N182 Chronic kidney disease, stage 2 (mild): Secondary | ICD-10-CM | POA: Diagnosis present

## 2014-02-16 LAB — CBC
HCT: 29.6 % — ABNORMAL LOW (ref 39.0–52.0)
Hemoglobin: 9.5 g/dL — ABNORMAL LOW (ref 13.0–17.0)
MCH: 26 pg (ref 26.0–34.0)
MCHC: 32.1 g/dL (ref 30.0–36.0)
MCV: 80.9 fL (ref 78.0–100.0)
PLATELETS: 215 10*3/uL (ref 150–400)
RBC: 3.66 MIL/uL — ABNORMAL LOW (ref 4.22–5.81)
RDW: 16.7 % — AB (ref 11.5–15.5)
WBC: 6.9 10*3/uL (ref 4.0–10.5)

## 2014-02-16 LAB — BASIC METABOLIC PANEL
BUN: 22 mg/dL (ref 6–23)
CALCIUM: 8.9 mg/dL (ref 8.4–10.5)
CO2: 30 mEq/L (ref 19–32)
Chloride: 96 mEq/L (ref 96–112)
Creatinine, Ser: 1.59 mg/dL — ABNORMAL HIGH (ref 0.50–1.35)
GFR calc Af Amer: 44 mL/min — ABNORMAL LOW (ref >90)
GFR calc non Af Amer: 38 mL/min — ABNORMAL LOW (ref >90)
GLUCOSE: 93 mg/dL (ref 70–99)
Potassium: 4 mEq/L (ref 3.7–5.3)
Sodium: 135 mEq/L — ABNORMAL LOW (ref 137–147)

## 2014-02-16 NOTE — Consult Note (Signed)
Psychiatry Followup Consult   [name] is an 80 y.o. male. Total Time spent with patient: 30 minutes  Assessment: AXIS I:  Psychosis NOS  AXIS II:  No diagnosis AXIS III:   Past Medical History  Diagnosis Date  . Hypertension   . Coronary artery disease   . MI (myocardial infarction)   . AAA (abdominal aortic aneurysm)   . Dementia    AXIS IV:  other psychosocial or environmental problems AXIS V:  41-50 serious symptoms  Plan:  Recommend psychiatric Inpatient admission when medically cleared.  Subjective:   [name] [name] is a 80 y.o. male patient admitted with aggressive behavior, paranoia. He has a history of multiple medical conditions as documented in the chart   HPI:  Patient seen chart reviewed.  Patient is more alert and coherent.  He admitted that he has been more paranoid about his wife who is trying to set their house on fire for the insurance.  He also admitted that he has been very agitated and wanted to shoot his wife .  He has a poor historian and has difficulty understanding because he does not teeth.  He knows he is in the hospital .  He also mentioned that he goes to a psychiatrist at [facility] for further medication management.  He denies any suicidal thoughts or plan but endorses homicidal thoughts towards his wife.  Patient accuses his wife however information is not confirmed.  His wife did not come to see him and patient is very upset on her.  Patient also admitted poor sleep, not safe at times and has been agitated and more irritable and past few days.  Patient has memory impairment.  He has a history of being agitated and threatening towards his family members with a knife.  Patient denies any hallucination but does require psychiatric inpatient treatment for stability and management of his psychosis. Past Psychiatric History: Past Medical History  Diagnosis Date  . Hypertension   . Coronary artery disease   . MI (myocardial infarction)   . AAA (abdominal  aortic aneurysm)   . Dementia     reports that he has never smoked. He does not have any smokeless tobacco history on file. He reports that he does not drink alcohol or use illicit drugs. Family History  Problem Relation Age of Onset  . Hypertension Brother      Living Arrangements: Spouse/significant other   Abuse/Neglect [facility]) Physical Abuse: Denies (per grand daughter) Verbal Abuse: Denies Sexual Abuse: Denies Allergies:  No Known Allergies    Objective: Blood pressure 110/59, pulse 75, temperature 98 F (36.7 C), temperature source Oral, resp. rate 18, height 5' 10"  (1.778 m), weight 148 lb 13 oz (67.5 kg), SpO2 98.00%.Body mass index is 21.35 kg/(m^2). Results for orders placed during the hospital encounter of 02/13/14 (from the past 72 hour(s))  ACETAMINOPHEN LEVEL     Status: None   Collection Time    02/13/14  6:09 PM      Result Value Ref Range   Acetaminophen (Tylenol), Serum <15.0  10 - 30 ug/mL   Comment:            THERAPEUTIC CONCENTRATIONS VARY     SIGNIFICANTLY. A RANGE OF 10-30     ug/mL MAY BE AN EFFECTIVE     CONCENTRATION FOR MANY PATIENTS.     HOWEVER, SOME ARE BEST TREATED     AT CONCENTRATIONS OUTSIDE THIS     RANGE.     ACETAMINOPHEN CONCENTRATIONS     >  150 ug/mL AT 4 HOURS AFTER     INGESTION AND >50 ug/mL AT 12     HOURS AFTER INGESTION ARE     OFTEN ASSOCIATED WITH TOXIC     REACTIONS.  CBC     Status: Abnormal   Collection Time    02/13/14  6:09 PM      Result Value Ref Range   WBC 5.0  4.0 - 10.5 K/uL   RBC 2.72 (*) 4.22 - 5.81 MIL/uL   Hemoglobin 6.8 (*) 13.0 - 17.0 g/dL   Comment: REPEATED TO VERIFY     CRITICAL RESULT CALLED TO, READ BACK BY AND VERIFIED WITH:     C.HALL RN AT 1828 ON [id number] BY C.BONGEL   HCT 21.8 (*) 39.0 - 52.0 %   MCV 80.1  78.0 - 100.0 fL   MCH 25.0 (*) 26.0 - 34.0 pg   MCHC 31.2  30.0 - 36.0 g/dL   RDW 16.9 (*) 11.5 - 15.5 %   Platelets 254  150 - 400 K/uL  COMPREHENSIVE METABOLIC PANEL     Status:  Abnormal   Collection Time    02/13/14  6:09 PM      Result Value Ref Range   Sodium 141  137 - 147 mEq/L   Potassium 3.8  3.7 - 5.3 mEq/L   Chloride 103  96 - 112 mEq/L   CO2 23  19 - 32 mEq/L   Glucose, Bld 177 (*) 70 - 99 mg/dL   BUN 13  6 - 23 mg/dL   Creatinine, Ser 1.26  0.50 - 1.35 mg/dL   Calcium 9.2  8.4 - 10.5 mg/dL   Total Protein 6.6  6.0 - 8.3 g/dL   Albumin 3.2 (*) 3.5 - 5.2 g/dL   AST 22  0 - 37 U/L   ALT 16  0 - 53 U/L   Alkaline Phosphatase 57  39 - 117 U/L   Total Bilirubin 0.2 (*) 0.3 - 1.2 mg/dL   GFR calc non Af Amer 50 (*) >90 mL/min   GFR calc Af Amer 58 (*) >90 mL/min   Comment: (NOTE)     The eGFR has been calculated using the CKD EPI equation.     This calculation has not been validated in all clinical situations.     eGFR's persistently <90 mL/min signify possible Chronic Kidney     Disease.  ETHANOL     Status: None   Collection Time    02/13/14  6:09 PM      Result Value Ref Range   Alcohol, Ethyl (B) <11  0 - 11 mg/dL   Comment:            LOWEST DETECTABLE LIMIT FOR     SERUM ALCOHOL IS 11 mg/dL     FOR MEDICAL PURPOSES ONLY  SALICYLATE LEVEL     Status: Abnormal   Collection Time    02/13/14  6:09 PM      Result Value Ref Range   Salicylate Lvl <1.6 (*) 2.8 - 20.0 mg/dL  URINE RAPID DRUG SCREEN (HOSP PERFORMED)     Status: Abnormal   Collection Time    02/13/14  7:35 PM      Result Value Ref Range   Opiates NONE DETECTED  NONE DETECTED   Cocaine NONE DETECTED  NONE DETECTED   Benzodiazepines NONE DETECTED  NONE DETECTED   Amphetamines NONE DETECTED  NONE DETECTED   Tetrahydrocannabinol POSITIVE (*) NONE DETECTED   Barbiturates NONE DETECTED  NONE DETECTED   Comment:            DRUG SCREEN FOR MEDICAL PURPOSES     ONLY.  IF CONFIRMATION IS NEEDED     FOR ANY PURPOSE, NOTIFY LAB     WITHIN 5 DAYS.                LOWEST DETECTABLE LIMITS     FOR URINE DRUG SCREEN     Drug Class       Cutoff (ng/mL)     Amphetamine      1000      Barbiturate      200     Benzodiazepine   638     Tricyclics       756     Opiates          300     Cocaine          300     THC              50  URINALYSIS, ROUTINE W REFLEX MICROSCOPIC     Status: None   Collection Time    02/13/14  7:35 PM      Result Value Ref Range   Color, Urine YELLOW  YELLOW   APPearance CLEAR  CLEAR   Specific Gravity, Urine 1.008  1.005 - 1.030   pH 6.5  5.0 - 8.0   Glucose, UA NEGATIVE  NEGATIVE mg/dL   Hgb urine dipstick NEGATIVE  NEGATIVE   Bilirubin Urine NEGATIVE  NEGATIVE   Ketones, ur NEGATIVE  NEGATIVE mg/dL   Protein, ur NEGATIVE  NEGATIVE mg/dL   Urobilinogen, UA 0.2  0.0 - 1.0 mg/dL   Nitrite NEGATIVE  NEGATIVE   Leukocytes, UA NEGATIVE  NEGATIVE   Comment: MICROSCOPIC NOT DONE ON URINES WITH NEGATIVE PROTEIN, BLOOD, LEUKOCYTES, NITRITE, OR GLUCOSE <1000 mg/dL.  ABO/RH     Status: None   Collection Time    02/13/14  8:02 PM      Result Value Ref Range   ABO/RH(D) A POS    PROTIME-INR     Status: None   Collection Time    02/13/14  8:43 PM      Result Value Ref Range   Prothrombin Time 13.5  11.6 - 15.2 seconds   INR 1.05  0.00 - 1.49  TYPE AND SCREEN     Status: None   Collection Time    02/13/14  8:43 PM      Result Value Ref Range   ABO/RH(D) A POS     Antibody Screen NEG     Sample Expiration 02/16/2014     Unit Number [id number]     Blood Component Type RED CELLS,LR     Unit division 00     Status of Unit ISSUED,FINAL     Transfusion Status OK TO TRANSFUSE     Crossmatch Result Compatible     Unit Number [id number]     Blood Component Type RBC LR PHER1     Unit division 00     Status of Unit ISSUED,FINAL     Transfusion Status OK TO TRANSFUSE     Crossmatch Result Compatible    VITAMIN B12     Status: None   Collection Time    02/13/14  8:43 PM      Result Value Ref Range   Vitamin B-12 588  211 - 911 pg/mL   Comment: Performed at [place]  Status: None   Collection Time    02/13/14   8:43 PM      Result Value Ref Range   Folate >20.0     Comment: (NOTE)     Reference Ranges            Deficient:       0.4 - 3.3 ng/mL            Indeterminate:   3.4 - 5.4 ng/mL            Normal:              > 5.4 ng/mL     Performed at [place] TIBC     Status: Abnormal   Collection Time    02/13/14  8:43 PM      Result Value Ref Range   Iron <10 (*) 42 - 135 ug/dL   TIBC Not calculated due to Iron <10.  215 - 435 ug/dL   Saturation Ratios Not calculated due to Iron <10.  20 - 55 %   UIBC 330  125 - 400 ug/dL   Comment: Performed at [place]     Status: None   Collection Time    02/13/14  8:43 PM      Result Value Ref Range   Ferritin 22  22 - 322 ng/mL   Comment: Performed at [place]     Status: Abnormal   Collection Time    02/13/14  8:43 PM      Result Value Ref Range   Retic Ct Pct 2.6  0.4 - 3.1 %   RBC. 2.73 (*) 4.22 - 5.81 MIL/uL   Retic Count, Manual 71.0  19.0 - 186.0 K/uL  PREPARE RBC (CROSSMATCH)     Status: None   Collection Time    02/14/14  2:00 AM      Result Value Ref Range   Order Confirmation ORDER PROCESSED BY BLOOD BANK    TROPONIN I     Status: None   Collection Time    02/14/14  2:25 AM      Result Value Ref Range   Troponin I <0.30  <0.30 ng/mL   Comment:            Due to the release kinetics of cTnI,     a negative result within the first hours     of the onset of symptoms does not rule out     myocardial infarction with certainty.     If myocardial infarction is still suspected,     repeat the test at appropriate intervals.  PRO B NATRIURETIC PEPTIDE     Status: Abnormal   Collection Time    02/14/14  2:25 AM      Result Value Ref Range   Pro B Natriuretic peptide (BNP) 2143.0 (*) 0 - 450 pg/mL  TROPONIN I     Status: None   Collection Time    02/14/14  9:10 AM      Result Value Ref Range   Troponin I <0.30  <0.30 ng/mL   Comment:            Due to the  release kinetics of cTnI,     a negative result within the first hours     of the onset of symptoms does not rule out     myocardial infarction with certainty.     If myocardial infarction is still  suspected,     repeat the test at appropriate intervals.  CBC WITH DIFFERENTIAL     Status: Abnormal   Collection Time    02/14/14  9:10 AM      Result Value Ref Range   WBC 4.4  4.0 - 10.5 K/uL   RBC 3.30 (*) 4.22 - 5.81 MIL/uL   Hemoglobin 8.3 (*) 13.0 - 17.0 g/dL   Comment: POST TRANSFUSION SPECIMEN     DELTA CHECK NOTED   HCT 27.2 (*) 39.0 - 52.0 %   MCV 82.4  78.0 - 100.0 fL   MCH 25.2 (*) 26.0 - 34.0 pg   MCHC 30.5  30.0 - 36.0 g/dL   RDW 16.6 (*) 11.5 - 15.5 %   Platelets 215  150 - 400 K/uL   Neutrophils Relative % 67  43 - 77 %   Neutro Abs 3.0  1.7 - 7.7 K/uL   Lymphocytes Relative 19  12 - 46 %   Lymphs Abs 0.8  0.7 - 4.0 K/uL   Monocytes Relative 12  3 - 12 %   Monocytes Absolute 0.5  0.1 - 1.0 K/uL   Eosinophils Relative 2  0 - 5 %   Eosinophils Absolute 0.1  0.0 - 0.7 K/uL   Basophils Relative 1  0 - 1 %   Basophils Absolute 0.0  0.0 - 0.1 K/uL  COMPREHENSIVE METABOLIC PANEL     Status: Abnormal   Collection Time    02/14/14  9:10 AM      Result Value Ref Range   Sodium 145  137 - 147 mEq/L   Potassium 3.5 (*) 3.7 - 5.3 mEq/L   Chloride 104  96 - 112 mEq/L   CO2 30  19 - 32 mEq/L   Glucose, Bld 92  70 - 99 mg/dL   BUN 11  6 - 23 mg/dL   Creatinine, Ser 1.09  0.50 - 1.35 mg/dL   Calcium 9.8  8.4 - 10.5 mg/dL   Total Protein 7.1  6.0 - 8.3 g/dL   Albumin 3.2 (*) 3.5 - 5.2 g/dL   AST 23  0 - 37 U/L   ALT 16  0 - 53 U/L   Alkaline Phosphatase 61  39 - 117 U/L   Total Bilirubin 0.3  0.3 - 1.2 mg/dL   GFR calc non Af Amer 59 (*) >90 mL/min   GFR calc Af Amer 69 (*) >90 mL/min   Comment: (NOTE)     The eGFR has been calculated using the CKD EPI equation.     This calculation has not been validated in all clinical situations.     eGFR's persistently <90 mL/min  signify possible Chronic Kidney     Disease.  MAGNESIUM     Status: None   Collection Time    02/14/14  9:10 AM      Result Value Ref Range   Magnesium 2.1  1.5 - 2.5 mg/dL  TROPONIN I     Status: None   Collection Time    02/14/14  1:54 PM      Result Value Ref Range   Troponin I <0.30  <0.30 ng/mL   Comment:            Due to the release kinetics of cTnI,     a negative result within the first hours     of the onset of symptoms does not rule out     myocardial infarction with certainty.     If  myocardial infarction is still suspected,     repeat the test at appropriate intervals.  CBC     Status: Abnormal   Collection Time    02/14/14  2:30 PM      Result Value Ref Range   WBC 6.2  4.0 - 10.5 K/uL   RBC 4.10 (*) 4.22 - 5.81 MIL/uL   Hemoglobin 10.7 (*) 13.0 - 17.0 g/dL   Comment: DELTA CHECK NOTED     POST TRANSFUSION SPECIMEN   HCT 33.8 (*) 39.0 - 52.0 %   MCV 82.4  78.0 - 100.0 fL   MCH 26.1  26.0 - 34.0 pg   MCHC 31.7  30.0 - 36.0 g/dL   RDW 16.1 (*) 11.5 - 15.5 %   Platelets 232  150 - 400 K/uL  BASIC METABOLIC PANEL     Status: Abnormal   Collection Time    02/15/14  5:28 AM      Result Value Ref Range   Sodium 140  137 - 147 mEq/L   Potassium 3.8  3.7 - 5.3 mEq/L   Chloride 102  96 - 112 mEq/L   CO2 29  19 - 32 mEq/L   Glucose, Bld 90  70 - 99 mg/dL   BUN 14  6 - 23 mg/dL   Creatinine, Ser 1.14  0.50 - 1.35 mg/dL   Calcium 9.2  8.4 - 10.5 mg/dL   GFR calc non Af Amer 56 (*) >90 mL/min   GFR calc Af Amer 65 (*) >90 mL/min   Comment: (NOTE)     The eGFR has been calculated using the CKD EPI equation.     This calculation has not been validated in all clinical situations.     eGFR's persistently <90 mL/min signify possible Chronic Kidney     Disease.  CBC     Status: Abnormal   Collection Time    02/15/14  5:28 AM      Result Value Ref Range   WBC 6.7  4.0 - 10.5 K/uL   RBC 3.75 (*) 4.22 - 5.81 MIL/uL   Hemoglobin 9.7 (*) 13.0 - 17.0 g/dL   HCT 30.8  (*) 39.0 - 52.0 %   MCV 82.1  78.0 - 100.0 fL   MCH 25.9 (*) 26.0 - 34.0 pg   MCHC 31.5  30.0 - 36.0 g/dL   RDW 16.4 (*) 11.5 - 15.5 %   Platelets 222  150 - 400 K/uL  BASIC METABOLIC PANEL     Status: Abnormal   Collection Time    02/16/14  4:03 AM      Result Value Ref Range   Sodium 135 (*) 137 - 147 mEq/L   Potassium 4.0  3.7 - 5.3 mEq/L   Chloride 96  96 - 112 mEq/L   CO2 30  19 - 32 mEq/L   Glucose, Bld 93  70 - 99 mg/dL   BUN 22  6 - 23 mg/dL   Creatinine, Ser 1.59 (*) 0.50 - 1.35 mg/dL   Calcium 8.9  8.4 - 10.5 mg/dL   GFR calc non Af Amer 38 (*) >90 mL/min   GFR calc Af Amer 44 (*) >90 mL/min   Comment: (NOTE)     The eGFR has been calculated using the CKD EPI equation.     This calculation has not been validated in all clinical situations.     eGFR's persistently <90 mL/min signify possible Chronic Kidney     Disease.  CBC  Status: Abnormal   Collection Time    02/16/14  3:37 PM      Result Value Ref Range   WBC 6.9  4.0 - 10.5 K/uL   RBC 3.66 (*) 4.22 - 5.81 MIL/uL   Hemoglobin 9.5 (*) 13.0 - 17.0 g/dL   HCT 29.6 (*) 39.0 - 52.0 %   MCV 80.9  78.0 - 100.0 fL   MCH 26.0  26.0 - 34.0 pg   MCHC 32.1  30.0 - 36.0 g/dL   RDW 16.7 (*) 11.5 - 15.5 %   Platelets 215  150 - 400 K/uL   Labs are reviewed and are pertinent for Low HGB  Current Facility-Administered Medications  Medication Dose Route Frequency Provider Last Rate Last Dose  . 0.9 %  sodium chloride infusion  250 mL Intravenous PRN Toy Baker, MD 10 mL/hr at 02/14/14 0332 250 mL at 02/14/14 0332  . acetaminophen (TYLENOL) tablet 650 mg  650 mg Oral Once Toy Baker, MD      . amLODipine (NORVASC) tablet 10 mg  10 mg Oral Daily Toy Baker, MD   10 mg at 02/16/14 0933  . atorvastatin (LIPITOR) tablet 40 mg  40 mg Oral q1800 Toy Baker, MD   40 mg at 02/15/14 1726  . diphenhydrAMINE (BENADRYL) capsule 25 mg  25 mg Oral Once Toy Baker, MD      . donepezil (ARICEPT)  tablet 10 mg  10 mg Oral QHS Toy Baker, MD   10 mg at 02/15/14 2336  . ferrous sulfate tablet 325 mg  325 mg Oral QPC breakfast Toy Baker, MD   325 mg at 02/16/14 0933  . folic acid (FOLVITE) tablet 2 mg  2 mg Oral Daily Toy Baker, MD   2 mg at 02/16/14 0933  . furosemide (LASIX) injection 20 mg  20 mg Intravenous Once Toy Baker, MD      . LORazepam (ATIVAN) injection 0.5 mg  0.5 mg Intravenous Q6H PRN Toy Baker, MD      . metoprolol tartrate (LOPRESSOR) tablet 25 mg  25 mg Oral BID Toy Baker, MD   25 mg at 02/16/14 0933  . OLANZapine zydis (ZYPREXA) disintegrating tablet 5 mg  5 mg Oral QHS Toy Baker, MD   5 mg at 02/15/14 2336  . ramipril (ALTACE) capsule 1.25 mg  1.25 mg Oral BID Toy Baker, MD   1.25 mg at 02/16/14 0934  . sertraline (ZOLOFT) tablet 200 mg  200 mg Oral Daily Toy Baker, MD   200 mg at 02/16/14 0933  . sodium chloride 0.9 % injection 3 mL  3 mL Intravenous Q12H Toy Baker, MD   3 mL at 02/16/14 0934  . sodium chloride 0.9 % injection 3 mL  3 mL Intravenous PRN Toy Baker, MD        Psychiatric Specialty Exam:     Blood pressure 110/59, pulse 75, temperature 98 F (36.7 C), temperature source Oral, resp. rate 18, height 5' 10"  (1.778 m), weight 148 lb 13 oz (67.5 kg), SpO2 98.00%.Body mass index is 21.35 kg/(m^2).  General Appearance: Disheveled and in bed  Eye Contact::  Minimal  Speech:  Slurred  Volume:  Decreased  Mood:  "not good"  Affect:  Restricted  Thought Process:  Circumstantial and Irrelevant  Orientation:  Negative  Thought Content:  Delusions and Paranoid Ideation  Suicidal Thoughts:  Denies   Homicidal Thoughts:  Threatening to shoot his wife.    Memory:  Immediate;   Poor Recent;  Poor Remote;   Poor  Judgement:  Impaired  Insight:  Lacking  Psychomotor Activity:  Decreased  Concentration:  Negative  Recall:  Negative  Fund of Knowledge:Negative   Language: Negative  Akathisia:  No  Handed:    AIMS (if indicated):     Assets:  Housing  Sleep:      Musculoskeletal: Strength & Muscle Tone: unable to evaluate Gait & Station: unable to evaluate Patient leans: N/A  Treatment Plan Summary: Medication management Patient requires inpatient psychiatric treatment.  Patient will require Gero-Psych unit .  Continue his current psychotropic medication.  Continue sitter for patient safety.  Once he is medically cleared , inpatient treatment recommended for stabilization and safety.  We will sign off however if there is any questions please reconsult and call [phone].   Dossie Der T Epiphany Seltzer 02/16/2014 5:22 PM

## 2014-02-16 NOTE — Care Management Note (Signed)
Cm spoke with patient's daughter [name] [name] at the bedside concerning discharge planning. Per PT eval recommendation for HHPT with 24 hr supervision. Per pt's daughter family to provide 66 care. Daughter provided with [facility] list.  No choice made at this time. Daughter request Rolator. Awaiting Md orders for RN/PT/OT & Rolator. Will continue to follow.   Venita Lick Trea Latner,MSN,RN [phone]

## 2014-02-16 NOTE — Evaluation (Signed)
Physical Therapy Evaluation Patient Details Name: Coyle Stordahl MRN: [id number] DOB: Jan 18, 1925 Today's Date: 02/16/2014   History of Present Illness  80 yo male admitted with delirium, anemia, agressive behaviors at home. hx of HTN, CAD, MI, AAA, dementia, GI bleed, colon cancer  Clinical Impression  On eval, pt required Mod assist for mobility-able to ambulate ~135 feet with walker. Pt is unsteady, weak. Daughter present (sleeping during eval)-awakened her at end of session and made her aware that pt will need someone to be with him all the time and that he needed +1 person assist to mobilize today. Daughter stated he would have supervision???? If 24/7 assist is not available, pt will likely need ST rehab placement if pt/family are agreeable to this.    Follow Up Recommendations Home health PT;Supervision/Assistance - 24 hour (if 24 hour care is not availabe, pt may need ST rehab placement. Daughter in room and stated pt will have assistance????)    Equipment Recommendations   (has walker already per daughter)    Recommendations for Other Services OT consult     Precautions / Restrictions Precautions Precautions: Fall Restrictions Weight Bearing Restrictions: No      Mobility  Bed Mobility Overal bed mobility: Needs Assistance Bed Mobility: Supine to Sit     Supine to sit: Min assist;HOB elevated     General bed mobility comments: Small amount of assist to get to EOB. Increased time.   Transfers Overall transfer level: Needs assistance Equipment used: Rolling walker (2 wheeled) Transfers: Sit to/from Stand Sit to Stand: From elevated surface;Mod assist         General transfer comment: Assist to rise, stabililze, control descent. Multimodal cues for safety, technique, hand placement.   Ambulation/Gait Ambulation/Gait assistance: Min assist Ambulation Distance (Feet): 135 Feet Assistive device: Rolling walker (2 wheeled) Gait Pattern/deviations: Trunk  flexed;Decreased stride length;Step-through pattern     General Gait Details: slow gait speed. Stability improved as distance increased. Assist to stabilize and maneuver with walker. Fatigues easily.   Stairs            Wheelchair Mobility    Modified Rankin (Stroke Patients Only)       Balance Overall balance assessment: Needs assistance         Standing balance support: Bilateral upper extremity supported;During functional activity Standing balance-Leahy Scale: Poor                               Pertinent Vitals/Pain Legs "sore"-unrated    Home Living Family/patient expects to be discharged to:: Unsure Living Arrangements: Spouse/significant other Available Help at Discharge: Family (unsure of amount of assist really available. daughter present and stated someone can be with him) Type of Home: [place]: Kasandra Knudsen - single point;Walker - 4 wheels      Prior Function                 Hand Dominance        Extremity/Trunk Assessment   Upper Extremity Assessment: Generalized weakness           Lower Extremity Assessment: Generalized weakness      Cervical / Trunk Assessment: Kyphotic  Communication   Communication: HOH  Cognition Arousal/Alertness: Awake/alert Behavior During Therapy: WFL for tasks assessed/performed Overall Cognitive Status: Difficult to assess  General Comments      Exercises        Assessment/Plan    PT Assessment Patient needs continued PT services  PT Diagnosis Difficulty walking;Generalized weakness   PT Problem List Decreased strength;Decreased activity tolerance;Decreased balance;Decreased mobility;Decreased knowledge of use of DME;Decreased cognition  PT Treatment Interventions DME instruction;Gait training;Functional mobility training;Therapeutic activities;Therapeutic exercise;Patient/family education;Balance training   PT Goals (Current goals  can be found in the Care Plan section) Acute Rehab PT Goals Patient Stated Goal: none stated. pt is extremely hard of hearing.  PT Goal Formulation: With family Time For Goal Achievement: 03/02/14 Potential to Achieve Goals: Good    Frequency Min 3X/week   Barriers to discharge        Co-evaluation               End of Session Equipment Utilized During Treatment: Gait belt Activity Tolerance: Patient limited by fatigue Patient left: in bed;with call bell/phone within reach;with family/visitor present;with nursing/sitter in room Nurse Communication: Mobility status (sitter present and observed level of assistance pt requires)         Time: 0623-7628 PT Time Calculation (min): 20 min   Charges:   PT Evaluation $Initial PT Evaluation Tier I: 1 Procedure PT Treatments $Gait Training: 8-22 mins   PT G Codes:          Weston Anna, MPT Pager: [phone]
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
## 2014-02-17 ENCOUNTER — Inpatient Hospital Stay (HOSPITAL_COMMUNITY): Payer: Medicare Other

## 2014-02-17 LAB — BASIC METABOLIC PANEL
BUN: 24 mg/dL — AB (ref 6–23)
CALCIUM: 8.8 mg/dL (ref 8.4–10.5)
CO2: 28 mEq/L (ref 19–32)
CREATININE: 1.38 mg/dL — AB (ref 0.50–1.35)
Chloride: 97 mEq/L (ref 96–112)
GFR calc non Af Amer: 45 mL/min — ABNORMAL LOW (ref >90)
GFR, EST AFRICAN AMERICAN: 52 mL/min — AB (ref >90)
Glucose, Bld: 104 mg/dL — ABNORMAL HIGH (ref 70–99)
Potassium: 3.6 mEq/L — ABNORMAL LOW (ref 3.7–5.3)
Sodium: 135 mEq/L — ABNORMAL LOW (ref 137–147)

## 2014-02-17 MED ORDER — LORAZEPAM 2 MG/ML IJ SOLN: 2.0000 mg | Freq: Once | INTRAMUSCULAR | Status: DC

## 2014-02-17 MED ORDER — LORAZEPAM 2 MG/ML IJ SOLN
1.0000 mg | Freq: Once | INTRAMUSCULAR | Status: AC
  Administered 2014-02-17: 1 mg via INTRAVENOUS

## 2014-02-17 NOTE — Progress Notes (Signed)
Clinical Social Work  Per MD, patient reports that patient is medically stable for DC to psych facility. CSW contacted the following hospitals re: placement:  [facility]- left message at 12:50  [facility]- left a message at 12:52  [facility]- no available beds  [facility]- no available beds   [facility]- 1 male bed. Referral faxed.  [facility] working on wait list at this time but encouraged CSW to send information. Referral faxed.  [facility]- 1 male bed. Referral faxed.  [facility]- no available beds  CSW will continue to follow for placement.  [place], [place] [phone]

## 2014-02-17 NOTE — Clinical Documentation Improvement (Signed)
02/17/14   Possible Clinical Conditions?    Expected Acute Blood Loss Anemia  Acute Blood Loss Anemia  Acute on chronic blood loss anemia  Chronic blood loss anemia  Precipitous drop in Hematocrit  Other Condition________________  Cannot Clinically Determine   Supporting Information:  Per 02/14/14   H&P = Anemia: will write for transfusion.    Per 02/16/14 Progress notes= Anemia - s/p transfusion  - stable, recheck cbc today   HgG Range = 6.8 - 10.7  ( 02/13/14 - 02/17/14) Hct.  Range = 21.8 -33.8   (02/13/14 - 02/17/14)  Thank You, Serena Colonel ,RN Clinical Documentation Specialist:  [place] Information Management

## 2014-02-17 NOTE — Care Management Note (Addendum)
    Page 1 of 2   02/18/2014     3:07:45 PM   CARE MANAGEMENT NOTE 02/18/2014  Patient:  [facility]   Account Number:  [id number]  Date Initiated:  02/15/2014  Documentation initiated by:  [facility]  Subjective/Objective Assessment:   Dementia with delrium     Action/Plan:   lives at with wife   Anticipated DC Date:  02/18/2014   Anticipated DC Plan:  [place]  In-house referral  Clinical Social Worker      DC Planning Services  CM consult      Choice offered to / List presented to:  C-4 Adult Children   DME arranged  [facility]      DME agency  [place] arranged  [place].   Status of service:  Completed, signed off Medicare Important Message given?   (If response is "NO", the following Medicare IM given date fields will be blank) Date Medicare IM given:   Date Additional Medicare IM given:    Discharge Disposition:  [place]  Per UR Regulation:  Reviewed for med. necessity/level of care/duration of stay  If discussed at Long Length of Stay Meetings, dates discussed:    Comments:  02/18/14 Jones Viviani RN,BSN NCM [phone] PSYCH-CLEARED FOR D/C HOME W/OPT REHAB.[facility] CHOSEN BY DTR FOR HHPT,AIDE,RW-ROLLATOR.TC KRISTEN REP AWARE OF D/C & HHC/DME NEEDS.ALSO PROVIDED DTR W/PRIVATE SITTER LIST-EXPLAINED THAT THIS IS AN INDEPENDANT DETERMINATION,& DOES NOT INVOLVE [place].DTR STATES THEY CAN TRANSP OWN THEIR OWN.ALSO PROVIDED INFO ON HEALTH INSURANCE WEBSITE.NO FURTHER D/C NEEDS.  02/17/14 Desirre Eickhoff RN,BSN NCM [place].D/C PLAN INPT PSYCH.  Mechele Claude, RN Registered Nurse Signed CASE MANAGEMENT Care Management Note Service date: 02/16/2014 12:07 PM Cm spoke with patient's daughter Orbie Hurst at the bedside concerning discharge planning. Per PT eval recommendation for HHPT with 24 hr supervision. Per pt's daughter  family to provide 102 care. Daughter provided with [facility] list.  No choice made at this time. Daughter request Rolator. Awaiting Md orders for RN/PT/OT & Rolator. Will continue to follow.

## 2014-02-17 NOTE — Progress Notes (Signed)
Clinical Social Work  CSW met with patient, two dtrs and APS worker Mechele [phone]) in room. CSW explained that psych MD and attending MD are recommending inpatient psych placement at DC. CSW explained process and facilities that [place] would search to determine where patient could be placed. Two dtrs are upset about recommendations and do not feel that patient needs inpatient placement. Dtrs report that patient will not go anywhere that is out of [place]. CSW explained no [place] facilities specialize in geriatric patients and that since patient is under IVC then he would have to go where placement was found. Dtrs continue to be upset. CSW will search for inpatient placement once medically stable.  [place], [place] [phone]
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
## 2014-02-17 NOTE — Progress Notes (Signed)
Clinical Social Work  CSW followed up with referrals at the following hospitals:  [facility]- denied on 4/14  [facility]- has not reviewed referral at this time  [facility]- has not reviewed referral at this time  CSW updated MD and will continue to follow.  [place], [place] [phone]

## 2014-02-17 NOTE — Progress Notes (Signed)
1930 pt got very agitated, irrate, paged PA twice to get medication. Security was called and they stayed in room with patient until I got an order and medication was given.  Got an order for 1mg  of Ativan. Gave pt the Ativan and he is now resting. Will continue to monitor patient.

## 2014-02-18 DIAGNOSIS — F29 Unspecified psychosis not due to a substance or known physiological condition: Secondary | ICD-10-CM

## 2014-02-18 LAB — CBC
HCT: 28.7 % — ABNORMAL LOW (ref 39.0–52.0)
Hemoglobin: 9.2 g/dL — ABNORMAL LOW (ref 13.0–17.0)
MCH: 25.8 pg — ABNORMAL LOW (ref 26.0–34.0)
MCHC: 32.1 g/dL (ref 30.0–36.0)
MCV: 80.6 fL (ref 78.0–100.0)
Platelets: 195 10*3/uL (ref 150–400)
RBC: 3.56 MIL/uL — ABNORMAL LOW (ref 4.22–5.81)
RDW: 17 % — AB (ref 11.5–15.5)
WBC: 7.7 10*3/uL (ref 4.0–10.5)

## 2014-02-18 LAB — BASIC METABOLIC PANEL
BUN: 27 mg/dL — AB (ref 6–23)
CO2: 28 mEq/L (ref 19–32)
CREATININE: 1.18 mg/dL (ref 0.50–1.35)
Calcium: 9 mg/dL (ref 8.4–10.5)
Chloride: 99 mEq/L (ref 96–112)
GFR, EST AFRICAN AMERICAN: 63 mL/min — AB (ref >90)
GFR, EST NON AFRICAN AMERICAN: 54 mL/min — AB (ref >90)
Glucose, Bld: 108 mg/dL — ABNORMAL HIGH (ref 70–99)
POTASSIUM: 4.6 meq/L (ref 3.7–5.3)
Sodium: 137 mEq/L (ref 137–147)

## 2014-02-18 MED ORDER — LORAZEPAM 2 MG/ML IJ SOLN: 1.0000 mg | Freq: Once | INTRAMUSCULAR | Status: DC

## 2014-02-18 NOTE — Progress Notes (Signed)
Clinical Social Work  CSW continues to search for placement and contacted the following facilities:  [facility]- left a message at 9:30 am  [facility] authorization valid 02/18/14-02/24/14 [id number]. Referral faxed.  [facility]- no [facility]  [facility]- no available beds  [facility]- no available beds  [facility]- left a message to inquire if they have reviewed referral  [facility]- MD is [facility] referral now and they will contact CSW once they have determined if they can accept  [facility]- available beds. Referral faxed.  CSW will continue to follow.  [place], [place] [phone]

## 2014-02-18 NOTE — Consult Note (Signed)
Psychiatry Followup Consult   [name] is an 80 y.o. male. Total Time spent with patient: 30 minutes  Assessment: AXIS I:  Psychosis NOS  AXIS II:  No diagnosis AXIS III:   Past Medical History  Diagnosis Date  . Hypertension   . Coronary artery disease   . MI (myocardial infarction)   . AAA (abdominal aortic aneurysm)   . Dementia    AXIS IV:  other psychosocial or environmental problems AXIS V:  61-70 mild symptoms  Plan:  No evidence of imminent risk to self or others at present.   Patient does not meet criteria for psychiatric inpatient admission. Supportive therapy provided about ongoing stressors. Discussed crisis plan, support from social network, calling 911, coming to the Emergency Department, and calling Suicide Hotline.  Subjective:   [name] [name] is a 80 y.o. male patient admitted with aggressive behavior, paranoia. He has a history of multiple medical conditions as documented in the chart   HPI:  Patient seen chart reviewed.  Consult was called again because patient's family requesting that patient should be discharged.  I had a long discussion with the patient's daughter [name] [name] who mentioned that patient has never threatened his wife in the past.  She endorsed that it was taken mistakenly when patient became upset and may have mentioned threatening behavior towards his wife.  The daughter told that patient has memory impairment and he has diagnosed with dementia .  She mentioned the patient does not remember his own words sometimes.  Today patient denies that he ever threatened his life to shoot her .  Patient told that he was upset on her but he did not want to kill anyone.  Patient denies also any suicidal thoughts or homicidal thoughts.  He wants to go on his own place .  When I asked if he see his life , patient replied that he will ignore her and mind his own business.  The patient's daughter also take full responsibility that patient will be closely watched  by a family member including daughter and granddaughter.  The patient was cooperative during the session.  Patient has difficulty remembering things and he is a poor historian however he clearly mentioned that he has no suicidal thoughts or homicidal thoughts.    Past Psychiatric History: Past Medical History  Diagnosis Date  . Hypertension   . Coronary artery disease   . MI (myocardial infarction)   . AAA (abdominal aortic aneurysm)   . Dementia     reports that he has never smoked. He does not have any smokeless tobacco history on file. He reports that he does not drink alcohol or use illicit drugs. Family History  Problem Relation Age of Onset  . Hypertension Brother      Living Arrangements: Spouse/significant other   Abuse/Neglect [facility]) Physical Abuse: Denies (per grand daughter) Verbal Abuse: Denies Sexual Abuse: Denies Allergies:  No Known Allergies    Objective: Blood pressure 132/70, pulse 86, temperature 99 F (37.2 C), temperature source Axillary, resp. rate 12, height 5' 10"  (1.778 m), weight 150 lb 12.7 oz (68.4 kg), SpO2 100.00%.Body mass index is 21.64 kg/(m^2). Results for orders placed during the hospital encounter of 02/13/14 (from the past 72 hour(s))  BASIC METABOLIC PANEL     Status: Abnormal   Collection Time    02/16/14  4:03 AM      Result Value Ref Range   Sodium 135 (*) 137 - 147 mEq/L   Potassium 4.0  3.7 -  5.3 mEq/L   Chloride 96  96 - 112 mEq/L   CO2 30  19 - 32 mEq/L   Glucose, Bld 93  70 - 99 mg/dL   BUN 22  6 - 23 mg/dL   Creatinine, Ser 1.59 (*) 0.50 - 1.35 mg/dL   Calcium 8.9  8.4 - 10.5 mg/dL   GFR calc non Af Amer 38 (*) >90 mL/min   GFR calc Af Amer 44 (*) >90 mL/min   Comment: (NOTE)     The eGFR has been calculated using the CKD EPI equation.     This calculation has not been validated in all clinical situations.     eGFR's persistently <90 mL/min signify possible Chronic Kidney     Disease.  CBC     Status: Abnormal    Collection Time    02/16/14  3:37 PM      Result Value Ref Range   WBC 6.9  4.0 - 10.5 K/uL   RBC 3.66 (*) 4.22 - 5.81 MIL/uL   Hemoglobin 9.5 (*) 13.0 - 17.0 g/dL   HCT 29.6 (*) 39.0 - 52.0 %   MCV 80.9  78.0 - 100.0 fL   MCH 26.0  26.0 - 34.0 pg   MCHC 32.1  30.0 - 36.0 g/dL   RDW 16.7 (*) 11.5 - 15.5 %   Platelets 215  150 - 400 K/uL  BASIC METABOLIC PANEL     Status: Abnormal   Collection Time    02/17/14  4:33 AM      Result Value Ref Range   Sodium 135 (*) 137 - 147 mEq/L   Potassium 3.6 (*) 3.7 - 5.3 mEq/L   Chloride 97  96 - 112 mEq/L   CO2 28  19 - 32 mEq/L   Glucose, Bld 104 (*) 70 - 99 mg/dL   BUN 24 (*) 6 - 23 mg/dL   Creatinine, Ser 1.38 (*) 0.50 - 1.35 mg/dL   Calcium 8.8  8.4 - 10.5 mg/dL   GFR calc non Af Amer 45 (*) >90 mL/min   GFR calc Af Amer 52 (*) >90 mL/min   Comment: (NOTE)     The eGFR has been calculated using the CKD EPI equation.     This calculation has not been validated in all clinical situations.     eGFR's persistently <90 mL/min signify possible Chronic Kidney     Disease.  CBC     Status: Abnormal   Collection Time    02/18/14  3:28 AM      Result Value Ref Range   WBC 7.7  4.0 - 10.5 K/uL   RBC 3.56 (*) 4.22 - 5.81 MIL/uL   Hemoglobin 9.2 (*) 13.0 - 17.0 g/dL   HCT 28.7 (*) 39.0 - 52.0 %   MCV 80.6  78.0 - 100.0 fL   MCH 25.8 (*) 26.0 - 34.0 pg   MCHC 32.1  30.0 - 36.0 g/dL   RDW 17.0 (*) 11.5 - 15.5 %   Platelets 195  150 - 400 K/uL  BASIC METABOLIC PANEL     Status: Abnormal   Collection Time    02/18/14  3:28 AM      Result Value Ref Range   Sodium 137  137 - 147 mEq/L   Potassium 4.6  3.7 - 5.3 mEq/L   Comment: DELTA CHECK NOTED     REPEATED TO VERIFY   Chloride 99  96 - 112 mEq/L   CO2 28  19 - 32 mEq/L  Glucose, Bld 108 (*) 70 - 99 mg/dL   BUN 27 (*) 6 - 23 mg/dL   Creatinine, Ser 1.18  0.50 - 1.35 mg/dL   Calcium 9.0  8.4 - 10.5 mg/dL   GFR calc non Af Amer 54 (*) >90 mL/min   GFR calc Af Amer 63 (*) >90 mL/min    Comment: (NOTE)     The eGFR has been calculated using the CKD EPI equation.     This calculation has not been validated in all clinical situations.     eGFR's persistently <90 mL/min signify possible Chronic Kidney     Disease.   Labs are reviewed and are pertinent for Low HGB  Current Facility-Administered Medications  Medication Dose Route Frequency Provider Last Rate Last Dose  . 0.9 %  sodium chloride infusion  250 mL Intravenous PRN Toy Baker, MD 10 mL/hr at 02/14/14 0332 250 mL at 02/14/14 0332  . acetaminophen (TYLENOL) tablet 650 mg  650 mg Oral Once Toy Baker, MD      . amLODipine (NORVASC) tablet 10 mg  10 mg Oral Daily Toy Baker, MD   10 mg at 02/18/14 1356  . atorvastatin (LIPITOR) tablet 40 mg  40 mg Oral q1800 Toy Baker, MD   40 mg at 02/17/14 1719  . diphenhydrAMINE (BENADRYL) capsule 25 mg  25 mg Oral Once Toy Baker, MD      . donepezil (ARICEPT) tablet 10 mg  10 mg Oral QHS Toy Baker, MD   10 mg at 02/16/14 2203  . ferrous sulfate tablet 325 mg  325 mg Oral QPC breakfast Toy Baker, MD   325 mg at 02/17/14 1207  . folic acid (FOLVITE) tablet 2 mg  2 mg Oral Daily Toy Baker, MD   2 mg at 02/18/14 1355  . furosemide (LASIX) injection 20 mg  20 mg Intravenous Once Toy Baker, MD      . LORazepam (ATIVAN) injection 0.5 mg  0.5 mg Intravenous Q6H PRN Toy Baker, MD   0.5 mg at 02/18/14 0252  . LORazepam (ATIVAN) injection 1 mg  1 mg Intravenous Once Jeryl Columbia, NP      . LORazepam (ATIVAN) injection 2 mg  2 mg Intravenous Once Jeryl Columbia, NP      . metoprolol tartrate (LOPRESSOR) tablet 25 mg  25 mg Oral BID Toy Baker, MD   25 mg at 02/18/14 1355  . OLANZapine zydis (ZYPREXA) disintegrating tablet 5 mg  5 mg Oral QHS Toy Baker, MD   5 mg at 02/16/14 2203  . ramipril (ALTACE) capsule 1.25 mg  1.25 mg Oral BID Toy Baker, MD   1.25 mg at 02/18/14 1355  .  sertraline (ZOLOFT) tablet 200 mg  200 mg Oral Daily Toy Baker, MD   200 mg at 02/18/14 1355  . sodium chloride 0.9 % injection 3 mL  3 mL Intravenous Q12H Toy Baker, MD   3 mL at 02/17/14 1212  . sodium chloride 0.9 % injection 3 mL  3 mL Intravenous PRN Toy Baker, MD        Psychiatric Specialty Exam: Treatment Plan Summary: Medication management  discontinue sitter.  Patient can be discharged with a followup appointment to see psychiatrist and therapist for medication management and counseling.  Please call [phone] when he came in he further question.  Teriah Muela T Tashawn Greff 02/18/2014 2:08 PM

## 2014-02-18 NOTE — Progress Notes (Signed)
Patient's ID and pocket knife returned to patient's daughter at bedside.

## 2014-02-18 NOTE — Discharge Summary (Signed)
Physician Discharge Summary  [name] [name] MPN:[id number] DOB: July 16, 1925 DOA: 02/13/2014  PCP: [name] [name]., MD  Admit date: 02/13/2014 Discharge date: 02/18/2014  Time spent: > 35 minutes  Recommendations for Outpatient Follow-up:  1. With outpatient psychiatry services 2. Continue to monitor BPs and adjust medication as needed 3. Reassess hgb/hct levels  Discharge Diagnoses:   Active Problems:   Anemia   Hypertension   Delirium   Diastolic CHF, acute on chronic   CKD (chronic kidney disease) stage 2, GFR 60-89 ml/min  Discharge Condition: stable  Diet recommendation: heart healthy  Filed Weights   02/16/14 0626 02/17/14 0700 02/18/14 0504  Weight: 67.5 kg (148 lb 13 oz) 68.7 kg (151 lb 7.3 oz) 68.4 kg (150 lb 12.7 oz)    History of present illness:  80 y/o AAM with history of HTN, CAD, MI, AAA, and Dementia who presented to the hospital after developing confusion and increased agitation.  Hospital Course:  Active Problems:   Anemia - stable currently, no active bleeding - recommend continued monitoring as outpatient.    Hypertension - stable continue home regimen on discharge.    Delirium/agitation Evaluated by psychiatrist who recommended the following after evaluation today 02/18/14: Plan: No evidence of imminent risk to self or others at present.  Patient does not meet criteria for psychiatric inpatient admission.  Supportive therapy provided about ongoing stressors.  Discussed crisis plan, support from social network, calling 911, coming to the Emergency Department, and calling Suicide Hotline.  Patient to be discharged to his Daughter [name] [name] who assumes full responsibility of her father.  No reports of suicidal or homicidal thoughts.    Diastolic CHF, acute on chronic - compensated. Patient to continue home regimen.    CKD (chronic kidney disease) stage 2, GFR 60-89 ml/min - back to baseline  currently.  Procedures:  None  Consultations:  Psychiatry  Discharge Exam: Filed Vitals:   02/18/14 0504  BP: 132/70  Pulse: 86  Temp: 99 F (37.2 C)  Resp: 12    General: Pt in NAD, alert and awake Cardiovascular: RRR, no MRG Respiratory: CTA BL, no wheeze  Discharge Instructions You were cared for by a hospitalist during your hospital stay. If you have any questions about your discharge medications or the care you received while you were in the hospital after you are discharged, you can call the unit and asked to speak with the hospitalist on call if the hospitalist that took care of you is not available. Once you are discharged, your primary care physician will handle any further medical issues. Please note that NO REFILLS for any discharge medications will be authorized once you are discharged, as it is imperative that you return to your primary care physician (or establish a relationship with a primary care physician if you do not have one) for your aftercare needs so that they can reassess your need for medications and monitor your lab values.  Discharge Orders   Future Orders Complete By Expires   Call MD for:  extreme fatigue  As directed    Call MD for:  persistant dizziness or light-headedness  As directed    Call MD for:  temperature >100.4  As directed    Diet - low sodium heart healthy  As directed    Discharge instructions  As directed    Increase activity slowly  As directed        Medication List         acetaminophen 325 MG tablet  Commonly  known as:  TYLENOL  Take 650 mg by mouth every 6 (six) hours as needed for pain.     amLODipine 10 MG tablet  Commonly known as:  NORVASC  Take 10 mg by mouth daily.     aspirin EC 81 MG tablet  Take 81 mg by mouth daily.     donepezil 10 MG tablet  Commonly known as:  ARICEPT  Take 10 mg by mouth at bedtime.     ferrous sulfate 325 (65 FE) MG tablet  Take 325 mg by mouth daily with breakfast.     folic  acid 1 MG tablet  Commonly known as:  FOLVITE  Take 2 mg by mouth daily.     furosemide 20 MG tablet  Commonly known as:  LASIX  Take 20 mg by mouth daily.     metoprolol tartrate 25 MG tablet  Commonly known as:  LOPRESSOR  Take 25 mg by mouth 2 (two) times daily.     sertraline 100 MG tablet  Commonly known as:  ZOLOFT  Take 200 mg by mouth daily.     simvastatin 80 MG tablet  Commonly known as:  ZOCOR  Take 40 mg by mouth at bedtime.       No Known Allergies    The results of significant diagnostics from this hospitalization (including imaging, microbiology, ancillary and laboratory) are listed below for reference.    Significant Diagnostic Studies: Dg Chest 2 View  02/14/2014   CLINICAL DATA:  Medical clearance. History of hypertension, Coronary artery disease, colon cancer, dementia, abdominal aortic aneurysm, altered mental status.  EXAM: CHEST  2 VIEW  COMPARISON:  01/15/2009  FINDINGS: Postoperative changes in the mediastinum. Thoracic scoliosis. Normal heart size and pulmonary vascularity. There is blunting of costophrenic angles, increasing since previous study, suggesting small pleural effusions. No focal airspace disease in the lungs. Degenerative changes in the shoulders and spine.  IMPRESSION: Small pleural effusions.  No active pulmonary disease.   Electronically Signed   By: Lucienne Capers M.D.   On: 02/14/2014 00:50   Dg Shoulder Right  02/15/2014   CLINICAL DATA:  Right shoulder pain status post trauma  EXAM: RIGHT SHOULDER - 2+ VIEW  COMPARISON:  DG SHOULDER *R* dated 01/25/2012  FINDINGS: An worsening of what  Recent Unity on standing does not mean multi excellent spirits three views of the right shoulder reveal the glenohumeral joint to be intact but there is osteoarthritic change. There is mild AC joint degenerative change. On one view only there is cortical irregularity of the proximal correction of the superior aspect of the scapular body. This may reflect a  fracture. The observed portions of the right clavicle and upper right ribs exhibit no acute abnormalities.  IMPRESSION: 1. There may be a nondisplaced fracture of the proximal body of the right scapula. 2. CT scanning is recommended. There are osteoarthritic changes of the glenohumeral joint and of the [facility] joint.   Electronically Signed   By: David  Martinique   On: 02/15/2014 12:06   Ct Head Wo Contrast  02/13/2014   CLINICAL DATA:  Confusion, altered mental status.  EXAM: CT HEAD WITHOUT CONTRAST  TECHNIQUE: Contiguous axial images were obtained from the base of the skull through the vertex without intravenous contrast.  COMPARISON:  CT HEAD W/O CM dated 01/04/2013  FINDINGS: The ventricles and sulci are normal for age. No intraparenchymal hemorrhage, mass effect nor midline shift. Patchy supratentorial white matter hypodensities are less than expected for patient's age  and though non-specific suggest sequelae of chronic small vessel ischemic disease. No acute large vascular territory infarcts.  No abnormal extra-axial fluid collections. Basal cisterns are patent. Moderate to severe calcific atherosclerosis of the carotid siphons.  No skull fracture. Mild paranasal sinus mucosal thickening without paranasal sinus air-fluid levels. Mastoid air cells are well aerated. Status post apparent left ocular lens implant with mild calcification.  IMPRESSION: No acute intracranial process.  Normal noncontrast CT of the head for age.   Electronically Signed   By: Elon Alas   On: 02/13/2014 19:42   Ct Shoulder Right Wo Contrast  02/17/2014   CLINICAL DATA:  Scapular fracture.  EXAM: CT OF THE RIGHT SHOULDER WITHOUT CONTRAST  TECHNIQUE: Multidetector CT imaging was performed according to the standard protocol. Multiplanar CT image reconstructions were also generated.  COMPARISON:  Radiographs 02/15/2014  FINDINGS: The [facility] joint is intact. There are moderate degenerative changes. The acromion is type 2 in shape. No  undersurface spurring change. Mild lateral downsloping.  The glenohumeral joint is maintained. Moderate degenerative changes and chondrocalcinosis. There are subchondral cystic changes on both sides of the joint.  The scapula is intact. No acute fractures identified. Bony remodeling at the base of the glenoid may be due to a remote fracture. There is also moderate cortical thickening involving the upper humeral shaft possibly related to previous trauma.  The lungs are clear of acute process. Scarring changes are noted. No acute rib fracture.  IMPRESSION: Moderate glenohumeral and AC joint degenerative changes.  No acute fracture.  Possible remote healed scapular fracture.  Moderate periosteal new bone and cortical thickening involving the humeral shaft likely related to prior trauma.   Electronically Signed   By: Kalman Jewels M.D.   On: 02/17/2014 12:07   Dg Ugi W/high Density Wo Kub  01/21/2014   CLINICAL DATA:  Dysphagia  EXAM: UPPER GI SERIES W/HIGH DENSITY WITHOUT KUB  TECHNIQUE: After obtaining a scout radiograph a routine upper GI series was performed using thin and high density barium  FLUOROSCOPY TIME:  2 min, 52 seconds  COMPARISON:  CT ABD/PELVIS W CM dated 01/11/2013  FINDINGS: The patient has difficulty hearing to follow instructions, and is 80 years old, but these factors only mildly limited diagnostic sensitivity and specificity of today's exam.  Mildly delayed initiation of swallowing was observed allowing early spillover into the vallecula and piriform sinuses. I did not observe aspiration or laryngeal penetration. There is a prominent cricopharyngeus muscle which indents the posterior pharynx. Cervical spondylosis observed.  No significant esophageal narrowing. Mild distal esophageal fold thickening without observed esophageal ulceration.  Small hiatal hernia.  Primary peristaltic waves were disrupted on 4 out of 4 swallows, compatible with nonspecific esophageal dysmotility disorder.  Several  episodes of gastroesophageal reflux were noted during the course of the examination. Small diverticulum in the descending duodenum.  No gastric or duodenal ulcer was observed.  IMPRESSION: 1. Early spill over and delayed initiation of swallowing, without laryngeal penetration or tracheal aspiration. Prominent cricopharyngeus muscle noted. 2. Nonspecific esophageal dysmotility disorder. 3. Distal esophageal fold thickening may reflect mild to moderate distal esophagitis, but no esophageal ulcer was observed. 4. Small type 1 hiatal hernia. 5. Incidental small diverticulum in the second portion of the duodenum.   Electronically Signed   By: Sherryl Barters M.D.   On: 01/21/2014 11:45    Microbiology: No results found for this or any previous visit (from the past 240 hour(s)).   Labs: Basic Metabolic Panel:  Recent Labs  Lab 02/14/14 0910 02/15/14 0528 02/16/14 0403 02/17/14 0433 02/18/14 0328  NA 145 140 135* 135* 137  K 3.5* 3.8 4.0 3.6* 4.6  CL 104 102 96 97 99  CO2 30 29 30 28 28   GLUCOSE 92 90 93 104* 108*  BUN 11 14 22  24* 27*  CREATININE 1.09 1.14 1.59* 1.38* 1.18  CALCIUM 9.8 9.2 8.9 8.8 9.0  MG 2.1  --   --   --   --    Liver Function Tests:  Recent Labs Lab 02/13/14 1809 02/14/14 0910  AST 22 23  ALT 16 16  ALKPHOS 57 61  BILITOT 0.2* 0.3  PROT 6.6 7.1  ALBUMIN 3.2* 3.2*   No results found for this basename: LIPASE, AMYLASE,  in the last 168 hours No results found for this basename: AMMONIA,  in the last 168 hours CBC:  Recent Labs Lab 02/14/14 0910 02/14/14 1430 02/15/14 0528 02/16/14 1537 02/18/14 0328  WBC 4.4 6.2 6.7 6.9 7.7  NEUTROABS 3.0  --   --   --   --   HGB 8.3* 10.7* 9.7* 9.5* 9.2*  HCT 27.2* 33.8* 30.8* 29.6* 28.7*  MCV 82.4 82.4 82.1 80.9 80.6  PLT 215 232 222 215 195   Cardiac Enzymes:  Recent Labs Lab 02/14/14 0225 02/14/14 0910 02/14/14 1354  TROPONINI <0.30 <0.30 <0.30   BNP: BNP (last 3 results)  Recent Labs   02/14/14 0225  PROBNP 2143.0*   CBG: No results found for this basename: GLUCAP,  in the last 168 hours     Signed:  Velvet Bathe  [facility] 02/18/2014, 2:42 PM

## 2014-02-18 NOTE — Progress Notes (Signed)
Clinical Social Work  Per chart review, psych MD has cleared patient to follow up on outpatient basis and attending MD has cleared patient to DC home. CSW met with patient and dtr at bedside. Patient sleeping heavily and did not wake up for session. Dtr reports that they are happy that patient can return home and feel that he will be safe. CSW provided outpatient resources of agencies that accept patient's insurance. Dtr feels that patient will not be agreeable to outpatient appointments but will try and encourage patient to attend. MD signed Notice of [place] form which was faxed to [place] office and placed on chart. CSW left message and informed APS worker Mechele Claude) of patient's plans to return home with family. APS worker has CSW contact information if further questions arise. RN aware that IVC has been rescinded and of DC plans.  CSW is signing off but available if further needs arise.  [place], [place] [phone]

## 2014-04-14 ENCOUNTER — Observation Stay (HOSPITAL_COMMUNITY)
Admission: EM | Admit: 2014-04-14 | Discharge: 2014-04-15 | Disposition: A | Discharge status: Home / Self Care | Payer: Medicare Other | Attending: Internal Medicine | Admitting: Internal Medicine

## 2014-04-14 ENCOUNTER — Encounter (HOSPITAL_COMMUNITY): Payer: Self-pay | Admitting: Emergency Medicine

## 2014-04-14 DIAGNOSIS — I252 Old myocardial infarction: Secondary | ICD-10-CM | POA: Insufficient documentation

## 2014-04-14 DIAGNOSIS — Z7982 Long term (current) use of aspirin: Secondary | ICD-10-CM | POA: Insufficient documentation

## 2014-04-14 DIAGNOSIS — K922 Gastrointestinal hemorrhage, unspecified: Principal | ICD-10-CM | POA: Insufficient documentation

## 2014-04-14 DIAGNOSIS — K921 Melena: Secondary | ICD-10-CM | POA: Insufficient documentation

## 2014-04-14 DIAGNOSIS — I714 Abdominal aortic aneurysm, without rupture, unspecified: Secondary | ICD-10-CM | POA: Insufficient documentation

## 2014-04-14 DIAGNOSIS — I5033 Acute on chronic diastolic (congestive) heart failure: Secondary | ICD-10-CM

## 2014-04-14 DIAGNOSIS — R5381 Other malaise: Secondary | ICD-10-CM | POA: Insufficient documentation

## 2014-04-14 DIAGNOSIS — E876 Hypokalemia: Secondary | ICD-10-CM | POA: Insufficient documentation

## 2014-04-14 DIAGNOSIS — R404 Transient alteration of awareness: Secondary | ICD-10-CM

## 2014-04-14 DIAGNOSIS — I10 Essential (primary) hypertension: Secondary | ICD-10-CM

## 2014-04-14 DIAGNOSIS — I129 Hypertensive chronic kidney disease with stage 1 through stage 4 chronic kidney disease, or unspecified chronic kidney disease: Secondary | ICD-10-CM | POA: Insufficient documentation

## 2014-04-14 DIAGNOSIS — Z86718 Personal history of other venous thrombosis and embolism: Secondary | ICD-10-CM | POA: Insufficient documentation

## 2014-04-14 DIAGNOSIS — Z66 Do not resuscitate: Secondary | ICD-10-CM | POA: Insufficient documentation

## 2014-04-14 DIAGNOSIS — R42 Dizziness and giddiness: Secondary | ICD-10-CM

## 2014-04-14 DIAGNOSIS — Z951 Presence of aortocoronary bypass graft: Secondary | ICD-10-CM | POA: Insufficient documentation

## 2014-04-14 DIAGNOSIS — R5383 Other fatigue: Secondary | ICD-10-CM

## 2014-04-14 DIAGNOSIS — D649 Anemia, unspecified: Secondary | ICD-10-CM | POA: Insufficient documentation

## 2014-04-14 DIAGNOSIS — I2510 Atherosclerotic heart disease of native coronary artery without angina pectoris: Secondary | ICD-10-CM | POA: Insufficient documentation

## 2014-04-14 DIAGNOSIS — N182 Chronic kidney disease, stage 2 (mild): Secondary | ICD-10-CM | POA: Insufficient documentation

## 2014-04-14 DIAGNOSIS — Z954 Presence of other heart-valve replacement: Secondary | ICD-10-CM | POA: Insufficient documentation

## 2014-04-14 DIAGNOSIS — R410 Disorientation, unspecified: Secondary | ICD-10-CM

## 2014-04-14 DIAGNOSIS — F0390 Unspecified dementia without behavioral disturbance: Secondary | ICD-10-CM | POA: Insufficient documentation

## 2014-04-14 LAB — CBC WITH DIFFERENTIAL/PLATELET
Basophils Absolute: 0 10*3/uL (ref 0.0–0.1)
Basophils Relative: 1 % (ref 0–1)
Eosinophils Absolute: 0.1 10*3/uL (ref 0.0–0.7)
Eosinophils Relative: 3 % (ref 0–5)
HEMATOCRIT: 18.4 % — AB (ref 39.0–52.0)
HEMOGLOBIN: 5.6 g/dL — AB (ref 13.0–17.0)
LYMPHS PCT: 22 % (ref 12–46)
Lymphs Abs: 0.9 10*3/uL (ref 0.7–4.0)
MCH: 23.5 pg — ABNORMAL LOW (ref 26.0–34.0)
MCHC: 30.4 g/dL (ref 30.0–36.0)
MCV: 77.3 fL — ABNORMAL LOW (ref 78.0–100.0)
MONO ABS: 0.4 10*3/uL (ref 0.1–1.0)
Monocytes Relative: 10 % (ref 3–12)
NEUTROS ABS: 2.8 10*3/uL (ref 1.7–7.7)
NEUTROS PCT: 64 % (ref 43–77)
Platelets: 162 10*3/uL (ref 150–400)
RBC: 2.38 MIL/uL — ABNORMAL LOW (ref 4.22–5.81)
RDW: 17.7 % — AB (ref 11.5–15.5)
WBC: 4.3 10*3/uL (ref 4.0–10.5)

## 2014-04-14 LAB — COMPREHENSIVE METABOLIC PANEL
ALBUMIN: 2.6 g/dL — AB (ref 3.5–5.2)
ALT: 12 U/L (ref 0–53)
AST: 18 U/L (ref 0–37)
Alkaline Phosphatase: 53 U/L (ref 39–117)
BUN: 26 mg/dL — ABNORMAL HIGH (ref 6–23)
CHLORIDE: 102 meq/L (ref 96–112)
CO2: 30 meq/L (ref 19–32)
Calcium: 8.5 mg/dL (ref 8.4–10.5)
Creatinine, Ser: 1.01 mg/dL (ref 0.50–1.35)
GFR calc Af Amer: 76 mL/min — ABNORMAL LOW (ref >90)
GFR, EST NON AFRICAN AMERICAN: 65 mL/min — AB (ref >90)
Glucose, Bld: 115 mg/dL — ABNORMAL HIGH (ref 70–99)
Potassium: 3.1 mEq/L — ABNORMAL LOW (ref 3.7–5.3)
Sodium: 141 mEq/L (ref 137–147)
Total Protein: 5.9 g/dL — ABNORMAL LOW (ref 6.0–8.3)

## 2014-04-14 LAB — POC OCCULT BLOOD, ED: Fecal Occult Bld: POSITIVE — AB

## 2014-04-14 LAB — PREPARE RBC (CROSSMATCH)

## 2014-04-14 MED ORDER — FUROSEMIDE 20 MG PO TABS
20.0000 mg | ORAL_TABLET | Freq: Every day | ORAL | Status: DC
  Administered 2014-04-15: 20 mg via ORAL
  Filled 2014-04-14: qty 1

## 2014-04-14 MED ORDER — LORAZEPAM 2 MG/ML IJ SOLN
1.0000 mg | Freq: Once | INTRAMUSCULAR | Status: AC
  Administered 2014-04-14: 1 mg via INTRAVENOUS
  Filled 2014-04-14: qty 1

## 2014-04-14 MED ORDER — LORAZEPAM 2 MG/ML IJ SOLN: 1.0000 mg | INTRAMUSCULAR | Status: DC | PRN

## 2014-04-14 MED ORDER — SODIUM CHLORIDE 0.9 % IV SOLN: 250.0000 mL | INTRAVENOUS | Status: DC | PRN

## 2014-04-14 MED ORDER — LORAZEPAM 2 MG/ML IJ SOLN
0.5000 mg | Freq: Once | INTRAMUSCULAR | Status: AC
  Administered 2014-04-14: 0.5 mg via INTRAVENOUS
  Filled 2014-04-14: qty 1

## 2014-04-14 MED ORDER — ACETAMINOPHEN 325 MG PO TABS
325.0000 mg | ORAL_TABLET | Freq: Four times a day (QID) | ORAL | Status: DC | PRN
  Filled 2014-04-14: qty 1

## 2014-04-14 MED ORDER — AMLODIPINE BESYLATE 10 MG PO TABS
10.0000 mg | ORAL_TABLET | Freq: Every day | ORAL | Status: DC
  Administered 2014-04-15: 10 mg via ORAL
  Filled 2014-04-14: qty 1

## 2014-04-14 MED ORDER — SERTRALINE HCL 100 MG PO TABS
200.0000 mg | ORAL_TABLET | Freq: Every day | ORAL | Status: DC
  Administered 2014-04-15: 200 mg via ORAL
  Filled 2014-04-14: qty 2

## 2014-04-14 MED ORDER — ACETAMINOPHEN 325 MG PO TABS
325.0000 mg | ORAL_TABLET | Freq: Every day | ORAL | Status: DC
  Administered 2014-04-15: 325 mg via ORAL
  Filled 2014-04-14: qty 1

## 2014-04-14 MED ORDER — METOPROLOL TARTRATE 50 MG PO TABS
50.0000 mg | ORAL_TABLET | Freq: Two times a day (BID) | ORAL | Status: DC
  Administered 2014-04-15: 50 mg via ORAL
  Filled 2014-04-14 (×3): qty 1

## 2014-04-14 MED ORDER — DONEPEZIL HCL 10 MG PO TABS
10.0000 mg | ORAL_TABLET | Freq: Every day | ORAL | Status: DC
  Filled 2014-04-14 (×2): qty 1

## 2014-04-14 MED ORDER — FOLIC ACID 1 MG PO TABS
2.0000 mg | ORAL_TABLET | Freq: Every day | ORAL | Status: DC
  Administered 2014-04-15: 2 mg via ORAL
  Filled 2014-04-14: qty 2

## 2014-04-14 MED ORDER — SODIUM CHLORIDE 0.9 % IJ SOLN: 3.0000 mL | INTRAMUSCULAR | Status: DC | PRN

## 2014-04-14 MED ORDER — CALCIUM CARBONATE 1250 (500 CA) MG PO TABS
1.0000 | ORAL_TABLET | Freq: Every day | ORAL | Status: DC
  Administered 2014-04-15: 500 mg via ORAL
  Filled 2014-04-14 (×2): qty 1

## 2014-04-14 MED ORDER — FERROUS SULFATE 325 (65 FE) MG PO TABS
325.0000 mg | ORAL_TABLET | Freq: Every day | ORAL | Status: DC
  Administered 2014-04-15: 325 mg via ORAL
  Filled 2014-04-14 (×2): qty 1

## 2014-04-14 MED ORDER — SODIUM CHLORIDE 0.9 % IJ SOLN: 3.0000 mL | Freq: Two times a day (BID) | INTRAMUSCULAR | Status: DC

## 2014-04-14 NOTE — ED Notes (Signed)
No s/s adverse reactions to blood transfusion noted.

## 2014-04-14 NOTE — ED Provider Notes (Signed)
CSN: [id number]     Arrival date & time 04/14/14  1955 History   First MD Initiated Contact with Patient 04/14/14 2017     Chief Complaint  Patient presents with  . Abnormal Lab    Hgb 5.4     (Consider location/radiation/quality/duration/timing/severity/associated sxs/prior Treatment) The history is provided by the patient, medical records and the spouse. No language interpreter was used.    [name] [name] is a 80 y.o. male  with a hx of hypertension, coronary disease, MA, abdominal aortic aneurysm, dementia presents to the Emergency Department due to abnormal lab values.  Patient's wife reports there is on call today from Dr. Katherine Roan informing them that the patient's hemoglobin was 5.4. Your instructed to present to the emergency department for blood transfusion. Patient's wife states the patient has had mild fatigue and generalized weakness. He required blood transfusion last year.  Patient denies seeing blood in his stool or urine. He denies chest pain, shortness of breath or abdominal pain. He does endorse increased weakness for the last several weeks. No aggravating or alleviating symptoms.  Pt denies fever, chills, headache, neck pain, chest pain, shortness of breath, abdominal pain, nausea, vomiting, diarrhea, dizziness, syncope, dysuria.  Patient denies hematochezia, melena.  Patient wife reports he is seen regularly at the [place] and at last check several weeks ago his hemoglobin was 7.2.    Level V caveat for dementia.   Past Medical History  Diagnosis Date  . Hypertension   . Coronary artery disease   . MI (myocardial infarction)   . AAA (abdominal aortic aneurysm)   . Dementia    Past Surgical History  Procedure Laterality Date  . Colonoscopy N/A 01/11/2013    Procedure: COLONOSCOPY;  Surgeon: Missy Sabins, MD;  Location: [facility];  Service: Endoscopy;  Laterality: N/A;  . Aorta doppler  06/26/2013    Fusiform aneurysm w/ current measurements of 3.8x4cm  . Cardiac  catheterization  11/23/2008    Recommendation-CABG and AVR  . Aortic valve replacement (avr)/coronary artery bypass grafting (cabg)  11/30/2008    AVR-52mm pericardial Magna tissue valve, model 3000, serial [id number]. CABG-x3. SVG to PDA, SVG to ramus, and SVG to distal LAD  . Cardiovascular stress test  08/02/2011    Perfusion defect seen in the inferobasal region consistant w/ diaphragmatic attenuation, pharmacological stress test w/o CP or EKG changes for ischemia.  . Transthoracic echocardiogram  01/06/2013    EF 55-60%, LA severely dilated,    Family History  Problem Relation Age of Onset  . Hypertension Brother    History  Substance Use Topics  . Smoking status: Never Smoker   . Smokeless tobacco: Not on file  . Alcohol Use: No    Review of Systems  Constitutional: Positive for fatigue. Negative for fever, diaphoresis, appetite change and unexpected weight change.  HENT: Negative for mouth sores.   Eyes: Negative for visual disturbance.  Respiratory: Negative for cough, chest tightness, shortness of breath and wheezing.   Cardiovascular: Negative for chest pain.  Gastrointestinal: Negative for nausea, vomiting, abdominal pain, diarrhea and constipation.  Endocrine: Negative for polydipsia, polyphagia and polyuria.  Genitourinary: Negative for dysuria, urgency, frequency and hematuria.  Musculoskeletal: Negative for back pain and neck stiffness.  Skin: Negative for rash.  Allergic/Immunologic: Negative for immunocompromised state.  Neurological: Positive for weakness (generalized). Negative for syncope, light-headedness and headaches.  Hematological: Does not bruise/bleed easily.  Psychiatric/Behavioral: Negative for sleep disturbance. The patient is not nervous/anxious.       Allergies  Review of patient's allergies indicates no known allergies.  Home Medications   Prior to Admission medications   Medication Sig Start Date End Date Taking? Authorizing Provider   acetaminophen (TYLENOL) 325 MG tablet Take 325 mg by mouth daily. At lunch time   Yes Historical Provider, MD  acetaminophen (TYLENOL) 325 MG tablet Take 325 mg by mouth every 6 (six) hours as needed for moderate pain.   Yes Historical Provider, MD  amLODipine (NORVASC) 10 MG tablet Take 10 mg by mouth daily.   Yes Historical Provider, MD  aspirin EC 81 MG tablet Take 81 mg by mouth daily.   Yes Historical Provider, MD  calcium carbonate (OS-CAL) 600 MG TABS tablet Take 600 mg by mouth daily with breakfast.   Yes Historical Provider, MD  Cholecalciferol (VITAMIN D PO) Take 1 tablet by mouth daily.   Yes Historical Provider, MD  donepezil (ARICEPT) 10 MG tablet Take 10 mg by mouth at bedtime.   Yes Historical Provider, MD  ferrous sulfate 325 (65 FE) MG tablet Take 325 mg by mouth daily with breakfast.   Yes Historical Provider, MD  folic acid (FOLVITE) 1 MG tablet Take 2 mg by mouth daily.   Yes Historical Provider, MD  furosemide (LASIX) 20 MG tablet Take 20 mg by mouth daily.   Yes Historical Provider, MD  metoprolol (LOPRESSOR) 50 MG tablet Take 50 mg by mouth 2 (two) times daily.   Yes Historical Provider, MD  sertraline (ZOLOFT) 100 MG tablet Take 200 mg by mouth daily.   Yes Historical Provider, MD  simvastatin (ZOCOR) 80 MG tablet Take 40 mg by mouth at bedtime.   Yes Historical Provider, MD   BP 104/52  Pulse 62  Temp(Src) 99.5 F (37.5 C) (Oral)  Resp 20  SpO2 98% Physical Exam  Nursing note and vitals reviewed. Constitutional: He appears well-developed and well-nourished. No distress.  Awake, alert, nontoxic appearance  HENT:  Head: Normocephalic and atraumatic.  Nose: Nose normal.  Mouth/Throat: Oropharynx is clear and moist. No oropharyngeal exudate.  Pallor of the mucous membranes and conjunctiva  Eyes: Conjunctivae are normal. No scleral icterus.  Neck: Normal range of motion. Neck supple.  Cardiovascular: Normal rate and intact distal pulses.   Murmur  heard. Irregular rhythm Mechanical heart valve murmur  Pulmonary/Chest: Effort normal and breath sounds normal. No respiratory distress. He has no wheezes.  Abdominal: Soft. Bowel sounds are normal. He exhibits no distension and no mass. There is no tenderness. There is no rebound and no guarding.  Genitourinary: Rectal exam shows no external hemorrhoid, no fissure and no tenderness. Guaiac positive stool.  DRE with melena  Musculoskeletal: Normal range of motion. He exhibits edema.  2+ pitting edema bilateral lower legs from mid forefoot to just below the knees  Lymphadenopathy:    He has no cervical adenopathy.  Neurological: He is alert.  Speech is clear alert to person only Answers most questions appropriately Moves extremities without ataxia  Skin: Skin is warm and dry. He is not diaphoretic. No erythema. There is pallor.  Psychiatric: He has a normal mood and affect.    ED Course  Procedures (including critical care time) Labs Review Labs Reviewed  CBC WITH DIFFERENTIAL - Abnormal; Notable for the following:    RBC 2.38 (*)    Hemoglobin 5.6 (*)    HCT 18.4 (*)    MCV 77.3 (*)    MCH 23.5 (*)    RDW 17.7 (*)    All other components within  normal limits  COMPREHENSIVE METABOLIC PANEL - Abnormal; Notable for the following:    Potassium 3.1 (*)    Glucose, Bld 115 (*)    BUN 26 (*)    Total Protein 5.9 (*)    Albumin 2.6 (*)    Total Bilirubin <0.2 (*)    GFR calc non Af Amer 65 (*)    GFR calc Af Amer 76 (*)    All other components within normal limits  POC OCCULT BLOOD, ED - Abnormal; Notable for the following:    Fecal Occult Bld POSITIVE (*)    All other components within normal limits  PREPARE RBC (CROSSMATCH)  TYPE AND SCREEN  TYPE AND SCREEN    Imaging Review No results found.   EKG Interpretation None      MDM   Final diagnoses:  Dementia  Anemia  Melena  Delirium   Abdo Denault presents from home with anemia.  Patient with dementia and  answers only some questions. Level V caveat.  Patient was informed by their primary care today the patient was anemic and needed to be seen here in the emergency department.  Patient denies melena or hematochezia and wife reports that patient does not tell her things like that.    Wife reports that he is no longer taking any anticoagulation for some time.  Patient does have a history of DVT and mechanical heart valve.  Wife reports patient needed much confusion last year for anemia of unknown etiology.  9:49 PM Pt is agitated, demanding to leave.  Wife is at bedside who is medical POA and requests that he be sedated and given the blood transfusion.  Ativan 1mg  IV ordered.    10:32 PM Patient resting comfortably. Discussed with triad who will admit.  The patient was discussed with and seen by Dr. Wilson Singer who agrees with the treatment plan.   Jarrett Soho Delania Ferg, PA-C 04/14/14 [place], PA-C 04/14/14 2312

## 2014-04-14 NOTE — H&P (Signed)
PCP:   Salena Saner., MD   Chief Complaint:  Abnormal lab  HPI: 80 yo male h/o dementia, CAD, htn comes in after being called by his doctor for abnormal hgb level down to about 5.  Wife not aware of any bleeding issues.  No n/v/d.  No abd pain.  No fevers.  He has seemed weaker than normal.  Wife wishes for him to be transfused overnight and no further w/u.  He did have a bm in the ED and it was melanotic and heme positive.  Review of Systems:  Positive and negative as per HPI otherwise all other systems are negative per wife, unobtainable from pt  Past Medical History: Past Medical History  Diagnosis Date  . Hypertension   . Coronary artery disease   . MI (myocardial infarction)   . AAA (abdominal aortic aneurysm)   . Dementia    Past Surgical History  Procedure Laterality Date  . Colonoscopy N/A 01/11/2013    Procedure: COLONOSCOPY;  Surgeon: Missy Sabins, MD;  Location: [place];  Service: Endoscopy;  Laterality: N/A;  . Aorta doppler  06/26/2013    Fusiform aneurysm w/ current measurements of 3.8x4cm  . Cardiac catheterization  11/23/2008    Recommendation-CABG and AVR  . Aortic valve replacement (avr)/coronary artery bypass grafting (cabg)  11/30/2008    AVR-82mm pericardial Magna tissue valve, model 3000, serial [id number]. CABG-x3. SVG to PDA, SVG to ramus, and SVG to distal LAD  . Cardiovascular stress test  08/02/2011    Perfusion defect seen in the inferobasal region consistant w/ diaphragmatic attenuation, pharmacological stress test w/o CP or EKG changes for ischemia.  . Transthoracic echocardiogram  01/06/2013    EF 55-60%, LA severely dilated,     Medications: Prior to Admission medications   Medication Sig Start Date End Date Taking? Authorizing Provider  acetaminophen (TYLENOL) 325 MG tablet Take 325 mg by mouth daily. At lunch time   Yes Historical Provider, MD  acetaminophen (TYLENOL) 325 MG tablet Take 325 mg by mouth every 6 (six) hours as needed for  moderate pain.   Yes Historical Provider, MD  amLODipine (NORVASC) 10 MG tablet Take 10 mg by mouth daily.   Yes Historical Provider, MD  aspirin EC 81 MG tablet Take 81 mg by mouth daily.   Yes Historical Provider, MD  calcium carbonate (OS-CAL) 600 MG TABS tablet Take 600 mg by mouth daily with breakfast.   Yes Historical Provider, MD  Cholecalciferol (VITAMIN D PO) Take 1 tablet by mouth daily.   Yes Historical Provider, MD  donepezil (ARICEPT) 10 MG tablet Take 10 mg by mouth at bedtime.   Yes Historical Provider, MD  ferrous sulfate 325 (65 FE) MG tablet Take 325 mg by mouth daily with breakfast.   Yes Historical Provider, MD  folic acid (FOLVITE) 1 MG tablet Take 2 mg by mouth daily.   Yes Historical Provider, MD  furosemide (LASIX) 20 MG tablet Take 20 mg by mouth daily.   Yes Historical Provider, MD  metoprolol (LOPRESSOR) 50 MG tablet Take 50 mg by mouth 2 (two) times daily.   Yes Historical Provider, MD  sertraline (ZOLOFT) 100 MG tablet Take 200 mg by mouth daily.   Yes Historical Provider, MD  simvastatin (ZOCOR) 80 MG tablet Take 40 mg by mouth at bedtime.   Yes Historical Provider, MD    Allergies:  No Known Allergies  Social History:  reports that he has never smoked. He does not have any smokeless tobacco history  on file. He reports that he does not drink alcohol or use illicit drugs.  Family History: Family History  Problem Relation Age of Onset  . Hypertension Brother     Physical Exam: Filed Vitals:   04/14/14 2012  BP: 104/52  Pulse: 62  Temp: 99.5 F (37.5 C)  TempSrc: Oral  Resp: 20  SpO2: 98%   General appearance: no distress Head: Normocephalic, without obvious abnormality, atraumatic Eyes: negative Nose: Nares normal. Septum midline. Mucosa normal. No drainage or sinus tenderness. Neck: no JVD and supple, symmetrical, trachea midline Lungs: clear to auscultation bilaterally Heart: regular rate and rhythm, S1, S2 normal, no murmur, click, rub or  gallop Abdomen: soft, non-tender; bowel sounds normal; no masses,  no organomegaly Extremities: extremities normal, atraumatic, no cyanosis or edema Pulses: 2+ and symmetric Skin: Skin color, texture, turgor normal. No rashes or lesions Neurologic: Grossly normal  Sedated from ativan    Labs on Admission:   Recent Labs  04/14/14 2035  NA 141  K 3.1*  CL 102  CO2 30  GLUCOSE 115*  BUN 26*  CREATININE 1.01  CALCIUM 8.5    Recent Labs  04/14/14 2035  AST 18  ALT 12  ALKPHOS 53  BILITOT <0.2*  PROT 5.9*  ALBUMIN 2.6*    Recent Labs  04/14/14 2035  WBC 4.3  NEUTROABS 2.8  HGB 5.6*  HCT 18.4*  MCV 77.3*  PLT 162   Radiological Exams on Admission: No results found.  Assessment/Plan  80 yo male with worsening anemia likely from gib  Principal Problem:   GIB (gastrointestinal bleeding)-  Transfuse 2 units overnight.  Hold aspirin.  Vss.  Wife wishes no further gi w/u at this time.  She would like for him to just get transfused and discharged b/c he gets very agitated when he is not home.  Have explained that he if shows any evidence of active bleeding overnight, he may need to stay for more than just 12 hours.  She understands this.  Active Problems:  Stable unless o/w noted   Anemia   Personal history of DVT (deep vein thrombosis)   CKD (chronic kidney disease) stage 2, GFR 60-89 ml/min   Dementia   Coronary artery disease  DNR confirmed.  obs on medical.  Phillips Grout 04/14/2014, 10:20 PM

## 2014-04-14 NOTE — ED Notes (Signed)
Pt's wife reports they received a phone call today from DR. Katherine Roan and was informed that pt's Hgb is 5.4.  Was instructed to come to the ED for blood tx.  Pt received blood tx last year.  Pt reports mild weakness at this time.

## 2014-04-15 ENCOUNTER — Encounter (HOSPITAL_COMMUNITY): Payer: Self-pay

## 2014-04-15 LAB — BASIC METABOLIC PANEL
BUN: 23 mg/dL (ref 6–23)
CALCIUM: 8.9 mg/dL (ref 8.4–10.5)
CO2: 31 meq/L (ref 19–32)
CREATININE: 0.91 mg/dL (ref 0.50–1.35)
Chloride: 102 mEq/L (ref 96–112)
GFR calc Af Amer: 86 mL/min — ABNORMAL LOW (ref >90)
GFR, EST NON AFRICAN AMERICAN: 75 mL/min — AB (ref >90)
Glucose, Bld: 90 mg/dL (ref 70–99)
POTASSIUM: 3.1 meq/L — AB (ref 3.7–5.3)
SODIUM: 142 meq/L (ref 137–147)

## 2014-04-15 LAB — CBC
HCT: 25.6 % — ABNORMAL LOW (ref 39.0–52.0)
Hemoglobin: 7.9 g/dL — ABNORMAL LOW (ref 13.0–17.0)
MCH: 23.9 pg — AB (ref 26.0–34.0)
MCHC: 30.9 g/dL (ref 30.0–36.0)
MCV: 77.6 fL — ABNORMAL LOW (ref 78.0–100.0)
PLATELETS: 165 10*3/uL (ref 150–400)
RBC: 3.3 MIL/uL — ABNORMAL LOW (ref 4.22–5.81)
RDW: 16.8 % — AB (ref 11.5–15.5)
WBC: 4.9 10*3/uL (ref 4.0–10.5)

## 2014-04-15 MED ORDER — POTASSIUM CHLORIDE ER 10 MEQ PO TBCR: 20.0000 meq | EXTENDED_RELEASE_TABLET | Freq: Every day | ORAL | Status: DC

## 2014-04-15 MED ORDER — POTASSIUM CHLORIDE CRYS ER 20 MEQ PO TBCR
40.0000 meq | EXTENDED_RELEASE_TABLET | ORAL | Status: AC
  Administered 2014-04-15 (×2): 40 meq via ORAL
  Filled 2014-04-15 (×2): qty 2

## 2014-04-15 MED ORDER — PANTOPRAZOLE SODIUM 40 MG PO TBEC: 40.0000 mg | DELAYED_RELEASE_TABLET | Freq: Every day | ORAL | Status: AC

## 2014-04-15 MED ORDER — MAGNESIUM SULFATE 40 MG/ML IJ SOLN
2.0000 g | Freq: Once | INTRAMUSCULAR | Status: AC
  Administered 2014-04-15: 2 g via INTRAVENOUS
  Filled 2014-04-15: qty 50

## 2014-04-15 NOTE — Discharge Summary (Addendum)
Physician Discharge Summary  [name] [name] DQQ:[id number] DOB: 08-09-1925 DOA: 04/14/2014  PCP: Leola Brazil, MD  Admit date: 04/14/2014 Discharge date: 04/15/2014  Time spent: >45 minutes  Recommendations for Outpatient Follow-up:  1. F/u hemoglobin closely 2. - recheck B met due to hypokalemia  Discharge Diagnoses:  Principal Problem:   GIB (gastrointestinal bleeding) Active Problems:   Anemia   Personal history of DVT (deep vein thrombosis)   CKD (chronic kidney disease) stage 2, GFR 60-89 ml/min   Dementia   Coronary artery disease   Discharge Condition: stable  Diet recommendation: heart healthy  Filed Weights   04/14/14 2356 04/15/14 0502  Weight: 77.5 kg (170 lb 13.7 oz) 77.1 kg (169 lb 15.6 oz)    History of present illness:  80 yo male h/o dementia, CAD, htn comes in after being called by his doctor for abnormal hgb level down to about 5. Wife not aware of any bleeding issues. No n/v/d. No abd pain. No fevers. He has seemed weaker than normal. Wife wishes for him to be transfused overnight and no further w/u. He did have a bm in the ED and it was melanotic and heme positive.   Hospital Course:  Anemia - FOB positive but no overt bleeding noted today- melanotic stool ? - requiring blood transfusion- given 2 U PRBC with improvement of Hgb from 5 to 7.9 - d/c ASA start Protonix and recheck stool occult in 1-2 wks  Hypokalemia - replacing prior to d/c- replacing Mg+ as well - on Lasix at home without K+ replacement therefore will order KCL 20 meq daily for home -  will need to recheck as outpt  Dementia - remained stable  Procedures:  none  Consultations:  none  Discharge Exam: Filed Vitals:   04/15/14 1445  BP: 170/67  Pulse: 72  Temp: 97.7 F (36.5 C)  Resp: 16    General: Awake but non communicative Cardiovascular: RRR, no murmurs Respiratory: CTA b/l   Discharge Instructions You were cared for by a hospitalist during your  hospital stay. If you have any questions about your discharge medications or the care you received while you were in the hospital after you are discharged, you can call the unit and asked to speak with the hospitalist on call if the hospitalist that took care of you is not available. Once you are discharged, your primary care physician will handle any further medical issues. Please note that NO REFILLS for any discharge medications will be authorized once you are discharged, as it is imperative that you return to your primary care physician (or establish a relationship with a primary care physician if you do not have one) for your aftercare needs so that they can reassess your need for medications and monitor your lab values.      Discharge Instructions   Diet - low sodium heart healthy    Complete by:  As directed      Increase activity slowly    Complete by:  As directed             Medication List    STOP taking these medications       aspirin EC 81 MG tablet      TAKE these medications       acetaminophen 325 MG tablet  Commonly known as:  TYLENOL  Take 325 mg by mouth every 6 (six) hours as needed for moderate pain.     acetaminophen 325 MG tablet  Commonly known as:  TYLENOL  Take 325 mg by mouth daily. At lunch time     amLODipine 10 MG tablet  Commonly known as:  NORVASC  Take 10 mg by mouth daily.     calcium carbonate 600 MG Tabs tablet  Commonly known as:  OS-CAL  Take 600 mg by mouth daily with breakfast.     donepezil 10 MG tablet  Commonly known as:  ARICEPT  Take 10 mg by mouth at bedtime.     ferrous sulfate 325 (65 FE) MG tablet  Take 325 mg by mouth daily with breakfast.     folic acid 1 MG tablet  Commonly known as:  FOLVITE  Take 2 mg by mouth daily.     furosemide 20 MG tablet  Commonly known as:  LASIX  Take 20 mg by mouth daily.     metoprolol 50 MG tablet  Commonly known as:  LOPRESSOR  Take 50 mg by mouth 2 (two) times daily.      potassium chloride 10 MEQ tablet  Commonly known as:  K-DUR  Take 2 tablets (20 mEq total) by mouth daily.     sertraline 100 MG tablet  Commonly known as:  ZOLOFT  Take 200 mg by mouth daily.     simvastatin 80 MG tablet  Commonly known as:  ZOCOR  Take 40 mg by mouth at bedtime.     VITAMIN D PO  Take 1 tablet by mouth daily.       No Known Allergies    The results of significant diagnostics from this hospitalization (including imaging, microbiology, ancillary and laboratory) are listed below for reference.    Significant Diagnostic Studies: No results found.  Microbiology: No results found for this or any previous visit (from the past 240 hour(s)).   Labs: Basic Metabolic Panel:  Recent Labs Lab 04/14/14 2035 04/15/14 0655  NA 141 142  K 3.1* 3.1*  CL 102 102  CO2 30 31  GLUCOSE 115* 90  BUN 26* 23  CREATININE 1.01 0.91  CALCIUM 8.5 8.9   Liver Function Tests:  Recent Labs Lab 04/14/14 2035  AST 18  ALT 12  ALKPHOS 53  BILITOT <0.2*  PROT 5.9*  ALBUMIN 2.6*   No results found for this basename: LIPASE, AMYLASE,  in the last 168 hours No results found for this basename: AMMONIA,  in the last 168 hours CBC:  Recent Labs Lab 04/14/14 2035 04/15/14 0655  WBC 4.3 4.9  NEUTROABS 2.8  --   HGB 5.6* 7.9*  HCT 18.4* 25.6*  MCV 77.3* 77.6*  PLT 162 165   Cardiac Enzymes: No results found for this basename: CKTOTAL, CKMB, CKMBINDEX, TROPONINI,  in the last 168 hours BNP: BNP (last 3 results)  Recent Labs  02/14/14 0225  PROBNP 2143.0*   CBG: No results found for this basename: GLUCAP,  in the last 168 hours     Signed:  Debbe Odea, MD [facility] 04/15/2014, 2:51 PM

## 2014-04-15 NOTE — Discharge Instructions (Signed)
Patient should return to the hospital if stool is black in color or if blood in stool. Start Protonix and stop Aspirin.

## 2014-04-15 NOTE — Progress Notes (Signed)
UR completed 

## 2014-04-16 LAB — TYPE AND SCREEN
ABO/RH(D): A POS
Antibody Screen: NEGATIVE
UNIT DIVISION: 0
Unit division: 0

## 2014-04-16 NOTE — Progress Notes (Signed)
Discharge summary sent to payer through [facility]  

## 2014-04-19 NOTE — ED Provider Notes (Signed)
Medical screening examination/treatment/procedure(s) were performed by non-physician practitioner and as supervising physician I was immediately available for consultation/collaboration.   EKG Interpretation None       Virgel Manifold, MD 04/19/14 [phone]

## 2014-06-05 ENCOUNTER — Observation Stay (HOSPITAL_COMMUNITY)
Admission: EM | Admit: 2014-06-05 | Discharge: 2014-06-06 | Disposition: A | Discharge status: Home / Self Care | Payer: Medicare Other | Attending: Internal Medicine | Admitting: Internal Medicine

## 2014-06-05 ENCOUNTER — Encounter (HOSPITAL_COMMUNITY): Payer: Self-pay | Admitting: Emergency Medicine

## 2014-06-05 DIAGNOSIS — K921 Melena: Secondary | ICD-10-CM | POA: Diagnosis not present

## 2014-06-05 DIAGNOSIS — I2510 Atherosclerotic heart disease of native coronary artery without angina pectoris: Secondary | ICD-10-CM | POA: Diagnosis present

## 2014-06-05 DIAGNOSIS — N182 Chronic kidney disease, stage 2 (mild): Secondary | ICD-10-CM | POA: Diagnosis not present

## 2014-06-05 DIAGNOSIS — I129 Hypertensive chronic kidney disease with stage 1 through stage 4 chronic kidney disease, or unspecified chronic kidney disease: Secondary | ICD-10-CM | POA: Insufficient documentation

## 2014-06-05 DIAGNOSIS — I10 Essential (primary) hypertension: Secondary | ICD-10-CM | POA: Diagnosis present

## 2014-06-05 DIAGNOSIS — Z954 Presence of other heart-valve replacement: Secondary | ICD-10-CM | POA: Diagnosis not present

## 2014-06-05 DIAGNOSIS — Z86718 Personal history of other venous thrombosis and embolism: Secondary | ICD-10-CM

## 2014-06-05 DIAGNOSIS — Z951 Presence of aortocoronary bypass graft: Secondary | ICD-10-CM | POA: Insufficient documentation

## 2014-06-05 DIAGNOSIS — I252 Old myocardial infarction: Secondary | ICD-10-CM | POA: Insufficient documentation

## 2014-06-05 DIAGNOSIS — R410 Disorientation, unspecified: Secondary | ICD-10-CM

## 2014-06-05 DIAGNOSIS — F0390 Unspecified dementia without behavioral disturbance: Secondary | ICD-10-CM | POA: Diagnosis present

## 2014-06-05 DIAGNOSIS — I5033 Acute on chronic diastolic (congestive) heart failure: Secondary | ICD-10-CM | POA: Diagnosis not present

## 2014-06-05 DIAGNOSIS — Z8546 Personal history of malignant neoplasm of prostate: Secondary | ICD-10-CM | POA: Insufficient documentation

## 2014-06-05 DIAGNOSIS — M549 Dorsalgia, unspecified: Secondary | ICD-10-CM | POA: Diagnosis not present

## 2014-06-05 DIAGNOSIS — K6389 Other specified diseases of intestine: Secondary | ICD-10-CM | POA: Insufficient documentation

## 2014-06-05 DIAGNOSIS — I714 Abdominal aortic aneurysm, without rupture, unspecified: Secondary | ICD-10-CM | POA: Insufficient documentation

## 2014-06-05 DIAGNOSIS — G8929 Other chronic pain: Secondary | ICD-10-CM | POA: Insufficient documentation

## 2014-06-05 DIAGNOSIS — R404 Transient alteration of awareness: Secondary | ICD-10-CM | POA: Insufficient documentation

## 2014-06-05 DIAGNOSIS — E876 Hypokalemia: Secondary | ICD-10-CM | POA: Diagnosis present

## 2014-06-05 DIAGNOSIS — D649 Anemia, unspecified: Secondary | ICD-10-CM | POA: Diagnosis not present

## 2014-06-05 DIAGNOSIS — R42 Dizziness and giddiness: Secondary | ICD-10-CM

## 2014-06-05 DIAGNOSIS — K922 Gastrointestinal hemorrhage, unspecified: Secondary | ICD-10-CM | POA: Diagnosis present

## 2014-06-05 DIAGNOSIS — I509 Heart failure, unspecified: Secondary | ICD-10-CM | POA: Diagnosis not present

## 2014-06-05 DIAGNOSIS — D500 Iron deficiency anemia secondary to blood loss (chronic): Secondary | ICD-10-CM

## 2014-06-05 LAB — CBC
HCT: 19 % — ABNORMAL LOW (ref 39.0–52.0)
Hemoglobin: 5.7 g/dL — CL (ref 13.0–17.0)
MCH: 21.9 pg — ABNORMAL LOW (ref 26.0–34.0)
MCHC: 30 g/dL (ref 30.0–36.0)
MCV: 73.1 fL — ABNORMAL LOW (ref 78.0–100.0)
PLATELETS: 180 10*3/uL (ref 150–400)
RBC: 2.6 MIL/uL — ABNORMAL LOW (ref 4.22–5.81)
RDW: 17.5 % — AB (ref 11.5–15.5)
WBC: 4.1 10*3/uL (ref 4.0–10.5)

## 2014-06-05 LAB — COMPREHENSIVE METABOLIC PANEL
ALK PHOS: 61 U/L (ref 39–117)
ALT: 14 U/L (ref 0–53)
ANION GAP: 10 (ref 5–15)
AST: 25 U/L (ref 0–37)
Albumin: 2.8 g/dL — ABNORMAL LOW (ref 3.5–5.2)
BUN: 17 mg/dL (ref 6–23)
CO2: 31 mEq/L (ref 19–32)
Calcium: 8.4 mg/dL (ref 8.4–10.5)
Chloride: 100 mEq/L (ref 96–112)
Creatinine, Ser: 0.86 mg/dL (ref 0.50–1.35)
GFR calc Af Amer: 88 mL/min — ABNORMAL LOW (ref >90)
GFR calc non Af Amer: 76 mL/min — ABNORMAL LOW (ref >90)
Glucose, Bld: 86 mg/dL (ref 70–99)
POTASSIUM: 3.2 meq/L — AB (ref 3.7–5.3)
SODIUM: 141 meq/L (ref 137–147)
TOTAL PROTEIN: 6.3 g/dL (ref 6.0–8.3)
Total Bilirubin: 0.3 mg/dL (ref 0.3–1.2)

## 2014-06-05 LAB — PROTIME-INR
INR: 1.08 (ref 0.00–1.49)
Prothrombin Time: 14 seconds (ref 11.6–15.2)

## 2014-06-05 LAB — POC OCCULT BLOOD, ED: Fecal Occult Bld: POSITIVE — AB

## 2014-06-05 LAB — PREPARE RBC (CROSSMATCH)

## 2014-06-05 MED ORDER — PANTOPRAZOLE SODIUM 40 MG PO TBEC
40.0000 mg | DELAYED_RELEASE_TABLET | Freq: Every day | ORAL | Status: DC
  Administered 2014-06-06: 40 mg via ORAL
  Filled 2014-06-05: qty 1

## 2014-06-05 MED ORDER — SODIUM CHLORIDE 0.9 % IV SOLN
10.0000 mL/h | Freq: Once | INTRAVENOUS | Status: AC
  Administered 2014-06-05: 10 mL/h via INTRAVENOUS

## 2014-06-05 MED ORDER — FUROSEMIDE 20 MG PO TABS
20.0000 mg | ORAL_TABLET | Freq: Every day | ORAL | Status: DC
  Administered 2014-06-06: 20 mg via ORAL
  Filled 2014-06-05: qty 1

## 2014-06-05 MED ORDER — ACETAMINOPHEN 325 MG PO TABS: 325.0000 mg | ORAL_TABLET | Freq: Four times a day (QID) | ORAL | Status: DC | PRN

## 2014-06-05 MED ORDER — AMLODIPINE BESYLATE 10 MG PO TABS
10.0000 mg | ORAL_TABLET | Freq: Every day | ORAL | Status: DC
  Administered 2014-06-06: 10 mg via ORAL
  Filled 2014-06-05: qty 1

## 2014-06-05 MED ORDER — FERROUS SULFATE 325 (65 FE) MG PO TABS
325.0000 mg | ORAL_TABLET | Freq: Every day | ORAL | Status: DC
  Administered 2014-06-06: 325 mg via ORAL
  Filled 2014-06-05 (×2): qty 1

## 2014-06-05 MED ORDER — PANTOPRAZOLE SODIUM 40 MG IV SOLR
40.0000 mg | INTRAVENOUS | Status: AC
  Administered 2014-06-05: 40 mg via INTRAVENOUS
  Filled 2014-06-05: qty 40

## 2014-06-05 MED ORDER — METOPROLOL TARTRATE 50 MG PO TABS
50.0000 mg | ORAL_TABLET | Freq: Two times a day (BID) | ORAL | Status: DC
  Administered 2014-06-05 – 2014-06-06 (×2): 50 mg via ORAL
  Filled 2014-06-05 (×3): qty 1

## 2014-06-05 MED ORDER — POTASSIUM CHLORIDE CRYS ER 20 MEQ PO TBCR
40.0000 meq | EXTENDED_RELEASE_TABLET | Freq: Two times a day (BID) | ORAL | Status: DC
  Administered 2014-06-05 – 2014-06-06 (×3): 40 meq via ORAL
  Filled 2014-06-05 (×4): qty 2

## 2014-06-05 MED ORDER — HYDRALAZINE HCL 20 MG/ML IJ SOLN
10.0000 mg | Freq: Once | INTRAMUSCULAR | Status: AC
  Administered 2014-06-05: 10 mg via INTRAVENOUS
  Filled 2014-06-05: qty 1

## 2014-06-05 MED ORDER — ATORVASTATIN CALCIUM 40 MG PO TABS
40.0000 mg | ORAL_TABLET | Freq: Every day | ORAL | Status: DC
  Administered 2014-06-05: 40 mg via ORAL
  Filled 2014-06-05 (×2): qty 1

## 2014-06-05 MED ORDER — DONEPEZIL HCL 10 MG PO TABS
10.0000 mg | ORAL_TABLET | Freq: Every day | ORAL | Status: DC
  Administered 2014-06-05: 10 mg via ORAL
  Filled 2014-06-05 (×2): qty 1

## 2014-06-05 MED ORDER — SERTRALINE HCL 100 MG PO TABS
200.0000 mg | ORAL_TABLET | Freq: Every day | ORAL | Status: DC
  Administered 2014-06-06: 200 mg via ORAL
  Filled 2014-06-05: qty 2

## 2014-06-05 NOTE — ED Provider Notes (Signed)
CSN: [id number]     Arrival date & time 06/05/14  1229 History   First MD Initiated Contact with Patient 06/05/14 1424     Chief Complaint  Patient presents with  . Abnormal Lab     (Consider location/radiation/quality/duration/timing/severity/associated sxs/prior Treatment) HPI  [name] [name] is a 80 y.o. male sent by his PCP for anemia. Patient saw his primary care physician several days ago for a normal checkup blood was drawn and he was found to be significantly anemic. As per patient's wife and daughter he is in his normal state of health, with no chest pain, palpitations, shortness of breath, melena, abdominal pain, nausea, vomiting. Patient was found to have colon cancer approximately 2 years ago. No treatment was initiated at that time. Since then patient is found to have melanotic stool and requiring transfusions periodically. Patient endorses chronic neck and low back pain. This is not atypical for him.  Past Medical History  Diagnosis Date  . Hypertension   . Coronary artery disease   . MI (myocardial infarction)   . AAA (abdominal aortic aneurysm)   . Dementia    Past Surgical History  Procedure Laterality Date  . Colonoscopy N/A 01/11/2013    Procedure: COLONOSCOPY;  Surgeon: Missy Sabins, MD;  Location: [facility];  Service: Endoscopy;  Laterality: N/A;  . Aorta doppler  06/26/2013    Fusiform aneurysm w/ current measurements of 3.8x4cm  . Cardiac catheterization  11/23/2008    Recommendation-CABG and AVR  . Aortic valve replacement (avr)/coronary artery bypass grafting (cabg)  11/30/2008    AVR-70mm pericardial Magna tissue valve, model 3000, serial [id number]. CABG-x3. SVG to PDA, SVG to ramus, and SVG to distal LAD  . Cardiovascular stress test  08/02/2011    Perfusion defect seen in the inferobasal region consistant w/ diaphragmatic attenuation, pharmacological stress test w/o CP or EKG changes for ischemia.  . Transthoracic echocardiogram  01/06/2013    EF 55-60%, LA  severely dilated,    Family History  Problem Relation Age of Onset  . Hypertension Brother    History  Substance Use Topics  . Smoking status: Never Smoker   . Smokeless tobacco: Never Used  . Alcohol Use: No    Review of Systems  10 systems reviewed and found to be negative, except as noted in the HPI.   Allergies  Review of patient's allergies indicates no known allergies.  Home Medications   Prior to Admission medications   Medication Sig Start Date End Date Taking? Authorizing Provider  acetaminophen (TYLENOL) 325 MG tablet Take 325 mg by mouth every 6 (six) hours as needed for moderate pain.   Yes Historical Provider, MD  amLODipine (NORVASC) 10 MG tablet Take 10 mg by mouth daily.   Yes Historical Provider, MD  calcium carbonate (OS-CAL) 600 MG TABS tablet Take 600 mg by mouth daily with breakfast.   Yes Historical Provider, MD  donepezil (ARICEPT) 10 MG tablet Take 10 mg by mouth at bedtime.   Yes Historical Provider, MD  ferrous sulfate 325 (65 FE) MG tablet Take 325 mg by mouth daily with breakfast.   Yes Historical Provider, MD  furosemide (LASIX) 20 MG tablet Take 20 mg by mouth daily.   Yes Historical Provider, MD  metoprolol (LOPRESSOR) 50 MG tablet Take 50 mg by mouth 2 (two) times daily.   Yes Historical Provider, MD  pantoprazole (PROTONIX) 40 MG tablet Take 1 tablet (40 mg total) by mouth daily. Switch for any other PPI at similar dose and  frequency 04/15/14  Yes Debbe Odea, MD  potassium chloride (K-DUR) 10 MEQ tablet Take 2 tablets (20 mEq total) by mouth daily. 04/15/14  Yes Debbe Odea, MD  sertraline (ZOLOFT) 100 MG tablet Take 200 mg by mouth daily.   Yes Historical Provider, MD  simvastatin (ZOCOR) 80 MG tablet Take 40 mg by mouth at bedtime.   Yes Historical Provider, MD  Vitamin D, Ergocalciferol, (DRISDOL) 50000 UNITS CAPS capsule Take 50,000 Units by mouth every 7 (seven) days. Takes on Sunday   Yes Historical Provider, MD   BP 192/70  Pulse 64   Temp(Src) 97.4 F (36.3 C) (Oral)  Resp 15  SpO2 97% Physical Exam  Nursing note and vitals reviewed. Constitutional: He is oriented to person, place, and time. He appears well-developed and well-nourished. No distress.  HENT:  Head: Normocephalic and atraumatic.  Mouth/Throat: Oropharynx is clear and moist.  Conjunctival pallor  Eyes: Conjunctivae and EOM are normal. Pupils are equal, round, and reactive to light.  Neck: Normal range of motion. Neck supple.  Cardiovascular: Normal rate, regular rhythm and intact distal pulses.   Mild bradycardia  Pulmonary/Chest: Effort normal and breath sounds normal. No stridor. No respiratory distress. He has no wheezes. He has no rales. He exhibits no tenderness.  Abdominal: Soft. Bowel sounds are normal. He exhibits no distension and no mass. There is no tenderness. There is no rebound and no guarding.  Genitourinary: Rectum normal.  Rectal exam a chaperoned by nurse:  Normal rectal tone, stool is slightly dark, no gross melena  Musculoskeletal: Normal range of motion.  Neurological: He is alert and oriented to person, place, and time.  Psychiatric: He has a normal mood and affect.    ED Course  Procedures (including critical care time)  CRITICAL CARE Performed by: Monico Blitz   Total critical care time: 45  Critical care time was exclusive of separately billable procedures and treating other patients.  Critical care was necessary to treat or prevent imminent or life-threatening deterioration.  Critical care was time spent personally by me on the following activities: development of treatment plan with patient and/or surrogate as well as nursing, discussions with consultants, evaluation of patient's response to treatment, examination of patient, obtaining history from patient or surrogate, ordering and performing treatments and interventions, ordering and review of laboratory studies, ordering and review of radiographic studies,  pulse oximetry and re-evaluation of patient's condition.  Labs Review Labs Reviewed  CBC - Abnormal; Notable for the following:    RBC 2.60 (*)    Hemoglobin 5.7 (*)    HCT 19.0 (*)    MCV 73.1 (*)    MCH 21.9 (*)    RDW 17.5 (*)    All other components within normal limits  COMPREHENSIVE METABOLIC PANEL - Abnormal; Notable for the following:    Potassium 3.2 (*)    Albumin 2.8 (*)    GFR calc non Af Amer 76 (*)    GFR calc Af Amer 88 (*)    All other components within normal limits  POC OCCULT BLOOD, ED - Abnormal; Notable for the following:    Fecal Occult Bld POSITIVE (*)    All other components within normal limits  PROTIME-INR  TYPE AND SCREEN  PREPARE RBC (CROSSMATCH)    Imaging Review No results found.   EKG Interpretation   Date/Time:  Friday June 05 2014 15:53:24 EDT Ventricular Rate:  65 PR Interval:    QRS Duration: 152 QT Interval:  535 QTC Calculation: 556 R  Axis:   -57 Text Interpretation:  NSR RBBB and LAFB No significant change since last  tracing Confirmed by HARRISON  MD, FORREST ([id number]) on 06/05/2014 4:00:33 PM      MDM   Final diagnoses:  Anemia, unspecified anemia type  Gastrointestinal hemorrhage with melena  Colonic mass  Dementia, without behavioral disturbance    Filed Vitals:   06/05/14 1615 06/05/14 1623 06/05/14 1626 06/05/14 1630  BP: 179/73 195/88 191/82 192/70  Pulse: 89 67 66 64  Temp: 97.4 F (36.3 C)     TempSrc: Oral     Resp: 16 16 17 15   SpO2: 96% 95% 97% 97%    Medications  potassium chloride SA (K-DUR,KLOR-CON) CR tablet 40 mEq (not administered)  0.9 %  sodium chloride infusion (not administered)  pantoprazole (PROTONIX) injection 40 mg (40 mg Intravenous Given 06/05/14 1540)    [name] [name] is a 80 y.o. male presenting with anemia with a hemoglobin of 5.7 over 19. Patient is asymptomatic. He has a colonic mass and is probably responsible for the slow bleed causing his anemia which he has had to have  transfusions for in the past. Patient is mildly hypokalemic at 3.2. He will be repleted orally. EKG with a normal sinus rhythm. Patient will be transfused 2 units. He is hemodynamically stable at admission. Patient will be admitted to a telemetry bed under the care of [facility].     Monico Blitz, PA-C 06/05/14 1656

## 2014-06-05 NOTE — ED Notes (Signed)
MD at bedside./ PA

## 2014-06-05 NOTE — ED Notes (Signed)
PT sleeping, unlabored even RR

## 2014-06-05 NOTE — ED Notes (Signed)
Added note to show switch to 124ml /hr rate for remaining 322.33mL of blood under doc flowsheets/blood administration.

## 2014-06-05 NOTE — ED Notes (Signed)
Transfusion completed

## 2014-06-05 NOTE — ED Notes (Signed)
Switched rate from 39mL to 176mL for the duration of the transfusion

## 2014-06-05 NOTE — Progress Notes (Signed)
Called and received report from Decker in ED at this time.

## 2014-06-05 NOTE — ED Notes (Signed)
Pt sent here from PCP for possible blood transfusion; pt with hx of frequent transfusion; per family some generalized weakness; pt sts some body aches

## 2014-06-05 NOTE — H&P (Signed)
History and Physical    [name] [name] RWE:[id number] DOB: [birth year] DOA: 06/05/2014  Referring physician: Dr. Aline Brochure PCP: Leola Brazil, MD  Specialists: none  Chief Complaint: low blood counts  HPI: [name] [name] is a 80 y.o. 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 He has no complaints, denies chest pain, breathing difficulties, denies seeing blood in his stool or dark tarry stools. He is complaining of chronic back pain. In the ED patient found with Hb of 5.7 and 2U pRBC ordered by EDP. He was hospitalized last month for same.   Review of Systems: as per HPI otherwise negative.  Past Medical History  Diagnosis Date  . Hypertension   . Coronary artery disease   . MI (myocardial infarction)   . AAA (abdominal aortic aneurysm)   . Dementia    Past Surgical History  Procedure Laterality Date  . Colonoscopy N/A 01/11/2013    Procedure: COLONOSCOPY;  Surgeon: Missy Sabins, MD;  Location: [facility];  Service: Endoscopy;  Laterality: N/A;  . Aorta doppler  06/26/2013    Fusiform aneurysm w/ current measurements of 3.8x4cm  . Cardiac catheterization  11/23/2008    Recommendation-CABG and AVR  . Aortic valve replacement (avr)/coronary artery bypass grafting (cabg)  11/30/2008    AVR-52mm pericardial Magna tissue valve, model 3000, serial [id number]. CABG-x3. SVG to PDA, SVG to ramus, and SVG to distal LAD  . Cardiovascular stress test  08/02/2011    Perfusion defect seen in the inferobasal region consistant w/ diaphragmatic attenuation, pharmacological stress test w/o CP or EKG changes for ischemia.  . Transthoracic echocardiogram  01/06/2013    EF 55-60%, LA severely dilated,    Social History:  reports that he has  never smoked. He has never used smokeless tobacco. He reports that he does not drink alcohol or use illicit drugs.  No Known Allergies  Family History  Problem Relation Age of Onset  . Hypertension Brother    Prior to Admission medications   Medication Sig Start Date End Date Taking? Authorizing Provider  acetaminophen (TYLENOL) 325 MG tablet Take 325 mg by mouth every 6 (six) hours as needed for moderate pain.   Yes Historical Provider, MD  amLODipine (NORVASC) 10 MG tablet Take 10 mg by mouth daily.   Yes Historical Provider, MD  calcium carbonate (OS-CAL) 600 MG TABS tablet Take 600 mg by mouth daily with breakfast.   Yes Historical Provider, MD  donepezil (ARICEPT) 10 MG tablet Take 10 mg by mouth at bedtime.   Yes Historical Provider, MD  ferrous sulfate 325 (65 FE) MG tablet Take 325 mg by mouth daily with breakfast.   Yes Historical Provider, MD  furosemide (LASIX) 20 MG tablet Take 20 mg by mouth daily.   Yes Historical Provider, MD  metoprolol (LOPRESSOR) 50 MG tablet Take 50 mg by mouth 2 (two) times daily.   Yes Historical Provider, MD  pantoprazole (PROTONIX) 40 MG tablet Take 1 tablet (40 mg total) by mouth daily. Switch for any other PPI at similar dose and frequency 04/15/14  Yes Debbe Odea, MD  potassium chloride (K-DUR) 10 MEQ tablet Take 2 tablets (20 mEq total) by mouth daily. 04/15/14  Yes Debbe Odea, MD  sertraline (ZOLOFT) 100 MG tablet Take 200  mg by mouth daily.   Yes Historical Provider, MD  simvastatin (ZOCOR) 80 MG tablet Take 40 mg by mouth at bedtime.   Yes Historical Provider, MD  Vitamin D, Ergocalciferol, (DRISDOL) 50000 UNITS CAPS capsule Take 50,000 Units by mouth every 7 (seven) days. Takes on Sunday   Yes Historical Provider, MD   Physical Exam: Filed Vitals:   06/05/14 1623 06/05/14 1626 06/05/14 1630 06/05/14 1635  BP: 195/88 191/82 192/70 193/78  Pulse: 67 66 64 56  Temp:    97.4 F (36.3 C)  TempSrc:    Axillary  Resp: 16 17 15 16   SpO2: 95%  97% 97% 95%    General:  NAD  Eyes: no scleral icterus  Cardiovascular: regular, 3/6 SEM  Respiratory: CTA biL, good air movement without wheezing  Abdomen: soft, non tender to palpation, positive bowel sounds, no guarding, no rebound  Skin: no rashes  Musculoskeletal: 1+ pitting peripheral edema R>L  Psychiatric: flat affect  Neurologic: non focal  Labs on Admission:  Basic Metabolic Panel:  Recent Labs Lab 06/05/14 1244  NA 141  K 3.2*  CL 100  CO2 31  GLUCOSE 86  BUN 17  CREATININE 0.86  CALCIUM 8.4   Liver Function Tests:  Recent Labs Lab 06/05/14 1244  AST 25  ALT 14  ALKPHOS 61  BILITOT 0.3  PROT 6.3  ALBUMIN 2.8*   CBC:  Recent Labs Lab 06/05/14 1244  WBC 4.1  HGB 5.7*  HCT 19.0*  MCV 73.1*  PLT 180   BNP (last 3 results)  Recent Labs  02/14/14 0225  PROBNP 2143.0*   EKG: Independently reviewed. Sinus rhythm  Assessment/Plan Principal Problem:   GI bleed Active Problems:   Anemia   Hypertension   Personal history of DVT (deep vein thrombosis)   Delirium   Diastolic CHF, acute on chronic   CKD (chronic kidney disease) stage 2, GFR 60-89 ml/min   Dementia   Coronary artery disease   Ulcerated colon mass - patient declined treatments in the past and last time he was seen in Oncology clinic he reiterated that he is not considering surgery.   Anemia - due to #1, transfuse 2U pRBC to get Hb > 7. He is asymptomatic. Continue his iron.  CAD - no chest pain, stable, resume home medications.  Dementia - patient very active at home, has mild dementia but can express his wishes and has made it very clear in the past to multiple family members that he does not want treatment for #1. History of prostate cancer  HTN - resume home medications Acute on chronic diastolic heart failure - has LE swelling, states he is at baseline, continue Lasix.   Diet: heart healthy Fluids: none  DVT Prophylaxis: SCD  Code Status: DNR  Family  Communication: d/w family at bedside  Disposition Plan: obs  Time spent: 47  This note has been created with Surveyor, quantity. Any transcriptional errors are unintentional.   Costin M. Cruzita Lederer, MD [facility] Pager [phone]  If 7PM-7AM, please contact night-coverage [email or web address] Password [facility] 06/05/2014, 4:37 PM

## 2014-06-06 LAB — TYPE AND SCREEN
ABO/RH(D): A POS
Antibody Screen: NEGATIVE
UNIT DIVISION: 0
Unit division: 0

## 2014-06-06 LAB — CBC
HCT: 27.2 % — ABNORMAL LOW (ref 39.0–52.0)
Hemoglobin: 8.5 g/dL — ABNORMAL LOW (ref 13.0–17.0)
MCH: 23.9 pg — ABNORMAL LOW (ref 26.0–34.0)
MCHC: 31.3 g/dL (ref 30.0–36.0)
MCV: 76.6 fL — ABNORMAL LOW (ref 78.0–100.0)
PLATELETS: 164 10*3/uL (ref 150–400)
RBC: 3.55 MIL/uL — ABNORMAL LOW (ref 4.22–5.81)
RDW: 17.6 % — AB (ref 11.5–15.5)
WBC: 5 10*3/uL (ref 4.0–10.5)

## 2014-06-06 MED ORDER — MAGNESIUM SULFATE IN D5W 10-5 MG/ML-% IV SOLN
1.0000 g | Freq: Once | INTRAVENOUS | Status: AC
  Administered 2014-06-06: 1 g via INTRAVENOUS
  Filled 2014-06-06: qty 100

## 2014-06-06 MED ORDER — CETYLPYRIDINIUM CHLORIDE 0.05 % MT LIQD
7.0000 mL | Freq: Two times a day (BID) | OROMUCOSAL | Status: DC
  Administered 2014-06-06 (×2): 7 mL via OROMUCOSAL

## 2014-06-06 MED ORDER — FERROUS SULFATE 325 (65 FE) MG PO TABS: 325.0000 mg | ORAL_TABLET | Freq: Two times a day (BID) | ORAL | Status: AC

## 2014-06-06 MED ORDER — HYDRALAZINE HCL 25 MG PO TABS: 25.0000 mg | ORAL_TABLET | Freq: Three times a day (TID) | ORAL | Status: DC

## 2014-06-06 MED ORDER — POTASSIUM CHLORIDE 10 MEQ/100ML IV SOLN
10.0000 meq | INTRAVENOUS | Status: AC
  Administered 2014-06-06 (×2): 10 meq via INTRAVENOUS
  Filled 2014-06-06 (×2): qty 100

## 2014-06-06 MED ORDER — HYDRALAZINE HCL 25 MG PO TABS
25.0000 mg | ORAL_TABLET | Freq: Three times a day (TID) | ORAL | Status: DC
  Filled 2014-06-06 (×3): qty 1

## 2014-06-06 NOTE — Discharge Instructions (Signed)
Follow with Primary MD KILPATRICK JR,GEORGE R, MD in 7 days   Get CBC, CMP , Iron panel  checked  by Primary MD next visit.    Activity: As tolerated with Full fall precautions use walker/cane & assistance as needed   Disposition Home     Diet: Heart Healthy    For Heart failure patients - Check your Weight same time everyday, if you gain over 2 pounds, or you develop in leg swelling, experience more shortness of breath or chest pain, call your Primary MD immediately. Follow Cardiac Low Salt Diet and 1.8 lit/day fluid restriction.   On your next visit with her primary care physician please Get Medicines reviewed and adjusted.  Please request your Prim.MD to go over all Hospital Tests and Procedure/Radiological results at the follow up, please get all Hospital records sent to your Prim MD by signing hospital release before you go home.   If you experience worsening of your admission symptoms, develop shortness of breath, life threatening emergency, suicidal or homicidal thoughts you must seek medical attention immediately by calling 911 or calling your MD immediately  if symptoms less severe.  You Must read complete instructions/literature along with all the possible adverse reactions/side effects for all the Medicines you take and that have been prescribed to you. Take any new Medicines after you have completely understood and accpet all the possible adverse reactions/side effects.   Do not drive, operating heavy machinery, perform activities at heights, swimming or participation in water activities or provide baby sitting services if your were admitted for syncope or siezures until you have seen by Primary MD or a Neurologist and advised to do so again.  Do not drive when taking Pain medications.    Do not take more than prescribed Pain, Sleep and Anxiety Medications  Special Instructions: If you have smoked or chewed Tobacco  in the last 2 yrs please stop smoking, stop any regular  Alcohol  and or any Recreational drug use.  Wear Seat belts while driving.   Please note  You were cared for by a hospitalist during your hospital stay. If you have any questions about your discharge medications or the care you received while you were in the hospital after you are discharged, you can call the unit and asked to speak with the hospitalist on call if the hospitalist that took care of you is not available. Once you are discharged, your primary care physician will handle any further medical issues. Please note that NO REFILLS for any discharge medications will be authorized once you are discharged, as it is imperative that you return to your primary care physician (or establish a relationship with a primary care physician if you do not have one) for your aftercare needs so that they can reassess your need for medications and monitor your lab values.

## 2014-06-06 NOTE — ED Provider Notes (Signed)
Medical screening examination/treatment/procedure(s) were conducted as a shared visit with non-physician practitioner(s) and myself.  I personally evaluated the patient during the encounter.   EKG Interpretation   Date/Time:  Friday June 05 2014 15:53:24 EDT Ventricular Rate:  65 PR Interval:    QRS Duration: 152 QT Interval:  535 QTC Calculation: 556 R Axis:   -57 Text Interpretation:  NSR RBBB and LAFB No significant change since last  tracing Confirmed by Gavina Dildine  MD, [place] ([id number]) on 06/05/2014 4:00:33 PM      I interviewed and examined the patient. Lungs are CTAB. Cardiac exam wnl. Abdomen soft.  Known colonic mass. Found to have Hgb of 5.7 Will transfuse and plan for admission.   Blanchard Kelch, MD 06/06/14 [phone]

## 2014-06-06 NOTE — Progress Notes (Signed)
NURSING PROGRESS NOTE  [name] [name] [id number] Admission Data: 06/06/2014 4:16 AM Attending Provider: Caren Griffins, MD LKG:[id number] Abran Cantor, MD Code Status: DNR  [name] [name] is a 80 y.o. male patient admitted from ED:  -No acute distress noted.  -No complaints of shortness of breath.  -No complaints of chest pain.   Cardiac Monitoring: Box # TX #18 in place. Cardiac monitor yields:NSR W/ 1HB Blood pressure 175/77, pulse 60, temperature 98.2 F (36.8 C), temperature source Oral, resp. rate 18, height 5\' 9"  (1.753 m), weight 77 kg (169 lb 12.1 oz), SpO2 99.00%.   IV Fluids:  IV in place, occlusive dsg intact without redness, IV cath antecubital right, condition patent and no redness none.   Allergies:  Review of patient's allergies indicates no known allergies.  Past Medical History:   has a past medical history of Hypertension; Coronary artery disease; MI (myocardial infarction); AAA (abdominal aortic aneurysm); and Dementia.  Past Surgical History:   has past surgical history that includes Colonoscopy (N/A, 01/11/2013); AORTA DOPPLER (06/26/2013); Cardiac catheterization (11/23/2008); Aortic valve replacement (avr)/coronary artery bypass grafting (cabg) (11/30/2008); Cardiovascular stress test (08/02/2011); and transthoracic echocardiogram (01/06/2013).  Social History:   reports that he has never smoked. He has never used smokeless tobacco. He reports that he does not drink alcohol or use illicit drugs.  Skin: NSI  Patient/Family orientated to room. Information packet given to patient/family. Admission inpatient armband information verified with patient/family to include name and date of birth and placed on patient arm. Side rails up x 2, fall assessment and education completed with patient/family. Patient/family able to verbalize understanding of risk associated with falls and verbalized understanding to call for assistance before getting out of bed. Call light within reach.  Patient/family able to voice and demonstrate understanding of unit orientation instructions.    Will continue to evaluate and treat per MD orders.

## 2014-06-06 NOTE — Progress Notes (Signed)
Patient discharge teaching given, including activity, diet, follow-up appoints, and medications. Patient and wife verbalized understanding of all discharge instructions. IV access was d/c'd. Vitals are stable. Skin is intact except as charted in most recent assessments. Pt to be escorted out by NT, to be driven home by family.

## 2014-06-06 NOTE — Progress Notes (Signed)
UR Completed.  [name] [name] [id number] 06/06/2014

## 2014-06-06 NOTE — Discharge Summary (Signed)
[name] [name], is a 80 y.o. male  DOB 05/16/1925  MRN [id number].  Admission date:  06/05/2014  Admitting Physician  Caren Griffins, MD  Discharge Date:  06/06/2014   Primary MD  Leola Brazil, MD  Recommendations for primary care physician for things to follow:      Admission Diagnosis  Hypokalemia [276.8] Colonic mass [569.89] Dementia, without behavioral disturbance [294.20] Gastrointestinal hemorrhage with melena [578.1] Anemia, unspecified anemia type [285.9]   Discharge Diagnosis  Hypokalemia [276.8] Colonic mass [569.89] Dementia, without behavioral disturbance [294.20] Gastrointestinal hemorrhage with melena [578.1] Anemia, unspecified anemia type [285.9]     Principal Problem:   GI bleed Active Problems:   Anemia   Hypertension   Personal history of DVT (deep vein thrombosis)   Delirium   Diastolic CHF, acute on chronic   CKD (chronic kidney disease) stage 2, GFR 60-89 ml/min   Dementia   Coronary artery disease      Past Medical History  Diagnosis Date  . Hypertension   . Coronary artery disease   . MI (myocardial infarction)   . AAA (abdominal aortic aneurysm)   . Dementia     Past Surgical History  Procedure Laterality Date  . Colonoscopy N/A 01/11/2013    Procedure: COLONOSCOPY;  Surgeon: Missy Sabins, MD;  Location: [facility];  Service: Endoscopy;  Laterality: N/A;  . Aorta doppler  06/26/2013    Fusiform aneurysm w/ current measurements of 3.8x4cm  . Cardiac catheterization  11/23/2008    Recommendation-CABG and AVR  . Aortic valve replacement (avr)/coronary artery bypass grafting (cabg)  11/30/2008    AVR-44mm pericardial Magna tissue valve, model 3000, serial [id number]. CABG-x3. SVG to PDA, SVG to ramus, and SVG to distal LAD  . Cardiovascular stress test  08/02/2011    Perfusion  defect seen in the inferobasal region consistant w/ diaphragmatic attenuation, pharmacological stress test w/o CP or EKG changes for ischemia.  . Transthoracic echocardiogram  01/06/2013    EF 55-60%, LA severely dilated,        History of present illness and  Hospital Course:     Kindly see H&P for history of present illness and admission details, please review complete Labs, Consult reports and Test reports for all details in brief  HPI  from the history and physical done on the day of admission  [name] [name] is a 80 y.o. 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 He has no complaints, denies chest pain, breathing difficulties, denies seeing blood in his stool or dark tarry stools. He is complaining of chronic back pain. In the ED patient found with Hb of 5.7 and 2U pRBC ordered by EDP. He was hospitalized last month for same.    Hospital Course    Ulcerated colon mass -  patient declined treatments in the past and last time he was seen in Oncology clinic he reiterated that he is not considering surgery. Continue supportive care. Wife informed. We'll request PCP to discuss long-term care plans with patient and family.   Anemia - due to #1, transfused 2U pRBC hemoglobin is appropriately up from 5.7-8.3, double his oral iron supplementation, will need close outpatient monitoring of hemoglobin along with iron panel, he could benefit from scheduled outpatient transfusions rather than semi-emergent admissions and on plan transfusions.   CAD - no chest pain, stable, resume home medications.     Dementia - patient very active at home, has mild dementia but can express his wishes and has made it very clear in the past to multiple family  members that he does not want treatment for #1.     History of prostate cancer supportive care and outpatient followup with PCP no acute issues.   HTN - resume home medications have added hydralazine for better blood pressure control.    Acute on chronic diastolic heart failure - has LE swelling, states he is at baseline, continue Lasix.    Low potassium has been replaced. Recheck in a week.     Discharge Condition: stable   Follow UP  Follow-up Information   Follow up with [facility] JR,GEORGE R, MD. Schedule an appointment as soon as possible for a visit in 1 week. (and any other PCP)    Specialty:  Pulmonary Disease   Contact information:   [place] [address] [phone]         Discharge Instructions  and  Discharge Medications      Discharge Instructions   Diet - low sodium heart healthy    Complete by:  As directed      Discharge instructions    Complete by:  As directed   Follow with Primary MD KILPATRICK JR,GEORGE R, MD in 7 days   Get CBC, CMP , Iron panel  checked  by Primary MD next visit.    Activity: As tolerated with Full fall precautions use walker/cane & assistance as needed   Disposition Home     Diet: Heart Healthy    For Heart failure patients - Check your Weight same time everyday, if you gain over 2 pounds, or you develop in leg swelling, experience more shortness of breath or chest pain, call your Primary MD immediately. Follow Cardiac Low Salt Diet and 1.8 lit/day fluid restriction.   On your next visit with her primary care physician please Get Medicines reviewed and adjusted.  Please request your Prim.MD to go over all Hospital Tests and Procedure/Radiological results at the follow up, please get all Hospital records sent to your Prim MD by signing hospital release before you go home.   If you experience worsening of your admission symptoms, develop shortness of breath, life threatening emergency,  suicidal or homicidal thoughts you must seek medical attention immediately by calling 911 or calling your MD immediately  if symptoms less severe.  You Must read complete instructions/literature along with all the possible adverse reactions/side effects for all the Medicines you take and that have been prescribed to you. Take any new Medicines after you have completely understood and accpet all the possible adverse reactions/side effects.   Do not drive, operating heavy machinery, perform activities at heights, swimming or participation in water activities or provide baby sitting services if your were admitted for syncope or siezures until you have seen by Primary MD or  a Neurologist and advised to do so again.  Do not drive when taking Pain medications.    Do not take more than prescribed Pain, Sleep and Anxiety Medications  Special Instructions: If you have smoked or chewed Tobacco  in the last 2 yrs please stop smoking, stop any regular Alcohol  and or any Recreational drug use.  Wear Seat belts while driving.   Please note  You were cared for by a hospitalist during your hospital stay. If you have any questions about your discharge medications or the care you received while you were in the hospital after you are discharged, you can call the unit and asked to speak with the hospitalist on call if the hospitalist that took care of you is not available. Once you are discharged, your primary care physician will handle any further medical issues. Please note that NO REFILLS for any discharge medications will be authorized once you are discharged, as it is imperative that you return to your primary care physician (or establish a relationship with a primary care physician if you do not have one) for your aftercare needs so that they can reassess your need for medications and monitor your lab values.     Increase activity slowly    Complete by:  As directed             Medication List           acetaminophen 325 MG tablet  Commonly known as:  TYLENOL  Take 325 mg by mouth every 6 (six) hours as needed for moderate pain.     amLODipine 10 MG tablet  Commonly known as:  NORVASC  Take 10 mg by mouth daily.     calcium carbonate 600 MG Tabs tablet  Commonly known as:  OS-CAL  Take 600 mg by mouth daily with breakfast.     donepezil 10 MG tablet  Commonly known as:  ARICEPT  Take 10 mg by mouth at bedtime.     ferrous sulfate 325 (65 FE) MG tablet  Take 1 tablet (325 mg total) by mouth 2 (two) times daily with a meal.     furosemide 20 MG tablet  Commonly known as:  LASIX  Take 20 mg by mouth daily.     hydrALAZINE 25 MG tablet  Commonly known as:  APRESOLINE  Take 1 tablet (25 mg total) by mouth every 8 (eight) hours.     metoprolol 50 MG tablet  Commonly known as:  LOPRESSOR  Take 50 mg by mouth 2 (two) times daily.     pantoprazole 40 MG tablet  Commonly known as:  PROTONIX  Take 1 tablet (40 mg total) by mouth daily. Switch for any other PPI at similar dose and frequency     potassium chloride 10 MEQ tablet  Commonly known as:  K-DUR  Take 2 tablets (20 mEq total) by mouth daily.     sertraline 100 MG tablet  Commonly known as:  ZOLOFT  Take 200 mg by mouth daily.     simvastatin 80 MG tablet  Commonly known as:  ZOCOR  Take 40 mg by mouth at bedtime.     Vitamin D (Ergocalciferol) 50000 UNITS Caps capsule  Commonly known as:  DRISDOL  Take 50,000 Units by mouth every 7 (seven) days. Takes on Sunday          Diet and Activity recommendation: See Discharge Instructions above   Consults obtained - none   Major procedures and Radiology Reports -  PLEASE review detailed and final reports for all details, in brief -       No results found.  Micro Results      No results found for this or any previous visit (from the past 240 hour(s)).     Today   Subjective:   [name] [name] today has no headache,no chest abdominal pain,no new  weakness tingling or numbness, feels much better wants to go home today.   Objective:   Blood pressure 180/77, pulse 70, temperature 98.6 F (37 C), temperature source Oral, resp. rate 18, height 5\' 9"  (1.753 m), weight 77 kg (169 lb 12.1 oz), SpO2 99.00%.   Intake/Output Summary (Last 24 hours) at 06/06/14 0948 Last data filed at 06/06/14 0513  Gross per 24 hour  Intake  347.5 ml  Output   4620 ml  Net -4272.5 ml    Exam Awake Alert, Oriented x 2, No new F.N deficits, Normal affect [place].AT,PERRAL Supple Neck,No JVD, No cervical lymphadenopathy appriciated.  Symmetrical Chest wall movement, Good air movement bilaterally, CTAB RRR,No Gallops,Rubs or new Murmurs, No Parasternal Heave +ve B.Sounds, Abd Soft, Non tender, No organomegaly appriciated, No rebound -guarding or rigidity. No Cyanosis, Clubbing or edema, No new Rash or bruise  Data Review   CBC w Diff: Lab Results  Component Value Date   WBC 5.0 06/06/2014   WBC 5.9 08/19/2013   HGB 8.5* 06/06/2014   HGB 8.8* 08/19/2013   HCT 27.2* 06/06/2014   HCT 27.1* 08/19/2013   PLT 164 06/06/2014   PLT 174 08/19/2013   LYMPHOPCT 22 04/14/2014   LYMPHOPCT 25.3 08/19/2013   MONOPCT 10 04/14/2014   MONOPCT 11.5 08/19/2013   EOSPCT 3 04/14/2014   EOSPCT 3.9 08/19/2013   BASOPCT 1 04/14/2014   BASOPCT 1.2 08/19/2013    CMP: Lab Results  Component Value Date   NA 141 06/05/2014   K 3.2* 06/05/2014   CL 100 06/05/2014   CO2 31 06/05/2014   BUN 17 06/05/2014   CREATININE 0.86 06/05/2014   PROT 6.3 06/05/2014   ALBUMIN 2.8* 06/05/2014   BILITOT 0.3 06/05/2014   ALKPHOS 61 06/05/2014   AST 25 06/05/2014   ALT 14 06/05/2014  .   Total Time in preparing paper work, data evaluation and todays exam - 35 minutes  Thurnell Lose M.D on 06/06/2014 at 9:48 AM  [facility] Office  [phone]   **Disclaimer: This note may have been dictated with voice recognition software. Similar sounding words can inadvertently be transcribed and  this note may contain transcription errors which may not have been corrected upon publication of note.**

## 2014-07-07 ENCOUNTER — Other Ambulatory Visit (HOSPITAL_COMMUNITY): Payer: Self-pay | Admitting: Internal Medicine

## 2014-12-24 DIAGNOSIS — R5383 Other fatigue: Secondary | ICD-10-CM | POA: Diagnosis not present

## 2014-12-24 DIAGNOSIS — M2550 Pain in unspecified joint: Secondary | ICD-10-CM | POA: Diagnosis not present

## 2014-12-24 DIAGNOSIS — E785 Hyperlipidemia, unspecified: Secondary | ICD-10-CM | POA: Diagnosis not present

## 2014-12-24 DIAGNOSIS — Z79899 Other long term (current) drug therapy: Secondary | ICD-10-CM | POA: Diagnosis not present

## 2014-12-24 DIAGNOSIS — F0150 Vascular dementia without behavioral disturbance: Secondary | ICD-10-CM | POA: Diagnosis not present

## 2014-12-24 DIAGNOSIS — D638 Anemia in other chronic diseases classified elsewhere: Secondary | ICD-10-CM | POA: Diagnosis not present

## 2014-12-28 ENCOUNTER — Inpatient Hospital Stay (HOSPITAL_COMMUNITY)
Admission: EM | Admit: 2014-12-28 | Discharge: 2014-12-30 | DRG: 811 | Disposition: A | Discharge status: Home / Self Care | Payer: Medicare Other | Attending: Internal Medicine | Admitting: Internal Medicine

## 2014-12-28 ENCOUNTER — Encounter (HOSPITAL_COMMUNITY): Payer: Self-pay | Admitting: Family Medicine

## 2014-12-28 DIAGNOSIS — I2510 Atherosclerotic heart disease of native coronary artery without angina pectoris: Secondary | ICD-10-CM | POA: Diagnosis present

## 2014-12-28 DIAGNOSIS — M1990 Unspecified osteoarthritis, unspecified site: Secondary | ICD-10-CM | POA: Diagnosis present

## 2014-12-28 DIAGNOSIS — D72819 Decreased white blood cell count, unspecified: Secondary | ICD-10-CM | POA: Diagnosis not present

## 2014-12-28 DIAGNOSIS — Z66 Do not resuscitate: Secondary | ICD-10-CM | POA: Diagnosis present

## 2014-12-28 DIAGNOSIS — I10 Essential (primary) hypertension: Secondary | ICD-10-CM | POA: Diagnosis not present

## 2014-12-28 DIAGNOSIS — Z6824 Body mass index (BMI) 24.0-24.9, adult: Secondary | ICD-10-CM | POA: Diagnosis not present

## 2014-12-28 DIAGNOSIS — M069 Rheumatoid arthritis, unspecified: Secondary | ICD-10-CM | POA: Diagnosis not present

## 2014-12-28 DIAGNOSIS — Z951 Presence of aortocoronary bypass graft: Secondary | ICD-10-CM

## 2014-12-28 DIAGNOSIS — H9190 Unspecified hearing loss, unspecified ear: Secondary | ICD-10-CM | POA: Diagnosis present

## 2014-12-28 DIAGNOSIS — R627 Adult failure to thrive: Secondary | ICD-10-CM | POA: Diagnosis present

## 2014-12-28 DIAGNOSIS — Z85038 Personal history of other malignant neoplasm of large intestine: Secondary | ICD-10-CM | POA: Diagnosis not present

## 2014-12-28 DIAGNOSIS — I714 Abdominal aortic aneurysm, without rupture: Secondary | ICD-10-CM | POA: Diagnosis not present

## 2014-12-28 DIAGNOSIS — Z8546 Personal history of malignant neoplasm of prostate: Secondary | ICD-10-CM

## 2014-12-28 DIAGNOSIS — D500 Iron deficiency anemia secondary to blood loss (chronic): Principal | ICD-10-CM | POA: Diagnosis present

## 2014-12-28 DIAGNOSIS — E876 Hypokalemia: Secondary | ICD-10-CM | POA: Diagnosis present

## 2014-12-28 DIAGNOSIS — I252 Old myocardial infarction: Secondary | ICD-10-CM | POA: Diagnosis not present

## 2014-12-28 DIAGNOSIS — Z952 Presence of prosthetic heart valve: Secondary | ICD-10-CM

## 2014-12-28 DIAGNOSIS — F0390 Unspecified dementia without behavioral disturbance: Secondary | ICD-10-CM | POA: Diagnosis not present

## 2014-12-28 DIAGNOSIS — D649 Anemia, unspecified: Secondary | ICD-10-CM

## 2014-12-28 DIAGNOSIS — G4730 Sleep apnea, unspecified: Secondary | ICD-10-CM | POA: Diagnosis not present

## 2014-12-28 DIAGNOSIS — E43 Unspecified severe protein-calorie malnutrition: Secondary | ICD-10-CM | POA: Diagnosis not present

## 2014-12-28 DIAGNOSIS — K219 Gastro-esophageal reflux disease without esophagitis: Secondary | ICD-10-CM | POA: Diagnosis not present

## 2014-12-28 HISTORY — DX: Sleep apnea, unspecified: G47.30

## 2014-12-28 HISTORY — DX: Unspecified osteoarthritis, unspecified site: M19.90

## 2014-12-28 HISTORY — DX: Malignant (primary) neoplasm, unspecified: C80.1

## 2014-12-28 HISTORY — DX: Anemia, unspecified: D64.9

## 2014-12-28 HISTORY — DX: Rheumatoid arthritis, unspecified: M06.9

## 2014-12-28 HISTORY — DX: Personal history of other diseases of the digestive system: Z87.19

## 2014-12-28 HISTORY — DX: Gastro-esophageal reflux disease without esophagitis: K21.9

## 2014-12-28 LAB — URINALYSIS, ROUTINE W REFLEX MICROSCOPIC
Bilirubin Urine: NEGATIVE
Glucose, UA: NEGATIVE mg/dL
Hgb urine dipstick: NEGATIVE
KETONES UR: NEGATIVE mg/dL
LEUKOCYTES UA: NEGATIVE
Nitrite: NEGATIVE
PROTEIN: NEGATIVE mg/dL
SPECIFIC GRAVITY, URINE: 1.006 (ref 1.005–1.030)
UROBILINOGEN UA: 0.2 mg/dL (ref 0.0–1.0)
pH: 7.5 (ref 5.0–8.0)

## 2014-12-28 LAB — OCCULT BLOOD, POC DEVICE: FECAL OCCULT BLD: NEGATIVE

## 2014-12-28 LAB — HEMOGLOBIN AND HEMATOCRIT, BLOOD
HCT: 19 % — ABNORMAL LOW (ref 39.0–52.0)
Hemoglobin: 5.5 g/dL — CL (ref 13.0–17.0)

## 2014-12-28 LAB — CBC
HEMATOCRIT: 15 % — AB (ref 39.0–52.0)
Hemoglobin: 4.1 g/dL — CL (ref 13.0–17.0)
MCH: 16.7 pg — ABNORMAL LOW (ref 26.0–34.0)
MCHC: 27.3 g/dL — ABNORMAL LOW (ref 30.0–36.0)
MCV: 61 fL — AB (ref 78.0–100.0)
Platelets: 177 10*3/uL (ref 150–400)
RBC: 2.46 MIL/uL — AB (ref 4.22–5.81)
RDW: 19.7 % — ABNORMAL HIGH (ref 11.5–15.5)
WBC: 3.8 10*3/uL — ABNORMAL LOW (ref 4.0–10.5)

## 2014-12-28 LAB — COMPREHENSIVE METABOLIC PANEL
ALK PHOS: 53 U/L (ref 39–117)
ALT: 12 U/L (ref 0–53)
AST: 20 U/L (ref 0–37)
Albumin: 2.9 g/dL — ABNORMAL LOW (ref 3.5–5.2)
Anion gap: 7 (ref 5–15)
BUN: 12 mg/dL (ref 6–23)
CO2: 32 mmol/L (ref 19–32)
Calcium: 8.5 mg/dL (ref 8.4–10.5)
Chloride: 99 mmol/L (ref 96–112)
Creatinine, Ser: 1.15 mg/dL (ref 0.50–1.35)
GFR calc Af Amer: 64 mL/min — ABNORMAL LOW (ref >90)
GFR, EST NON AFRICAN AMERICAN: 55 mL/min — AB (ref >90)
Glucose, Bld: 94 mg/dL (ref 70–99)
Potassium: 2.8 mmol/L — ABNORMAL LOW (ref 3.5–5.1)
Sodium: 138 mmol/L (ref 135–145)
TOTAL PROTEIN: 6.2 g/dL (ref 6.0–8.3)
Total Bilirubin: 0.6 mg/dL (ref 0.3–1.2)

## 2014-12-28 LAB — BASIC METABOLIC PANEL
ANION GAP: 5 (ref 5–15)
BUN: 11 mg/dL (ref 6–23)
CO2: 31 mmol/L (ref 19–32)
Calcium: 8.5 mg/dL (ref 8.4–10.5)
Chloride: 100 mmol/L (ref 96–112)
Creatinine, Ser: 1.07 mg/dL (ref 0.50–1.35)
GFR calc non Af Amer: 60 mL/min — ABNORMAL LOW (ref >90)
GFR, EST AFRICAN AMERICAN: 70 mL/min — AB (ref >90)
GLUCOSE: 148 mg/dL — AB (ref 70–99)
POTASSIUM: 2.9 mmol/L — AB (ref 3.5–5.1)
SODIUM: 136 mmol/L (ref 135–145)

## 2014-12-28 LAB — MAGNESIUM: MAGNESIUM: 1.8 mg/dL (ref 1.5–2.5)

## 2014-12-28 LAB — RETICULOCYTES
RBC.: 2.96 MIL/uL — ABNORMAL LOW (ref 4.22–5.81)
RETIC COUNT ABSOLUTE: 20.7 10*3/uL (ref 19.0–186.0)
Retic Ct Pct: 0.7 % (ref 0.4–3.1)

## 2014-12-28 LAB — PROTIME-INR
INR: 1.24 (ref 0.00–1.49)
Prothrombin Time: 15.7 seconds — ABNORMAL HIGH (ref 11.6–15.2)

## 2014-12-28 LAB — PREPARE RBC (CROSSMATCH)

## 2014-12-28 MED ORDER — ONDANSETRON HCL 4 MG/2ML IJ SOLN: 4.0000 mg | Freq: Four times a day (QID) | INTRAMUSCULAR | Status: DC | PRN

## 2014-12-28 MED ORDER — ENSURE COMPLETE PO LIQD
237.0000 mL | Freq: Two times a day (BID) | ORAL | Status: DC
  Administered 2014-12-29: 237 mL via ORAL

## 2014-12-28 MED ORDER — CALCIUM CARBONATE 1250 (500 CA) MG PO TABS
1250.0000 mg | ORAL_TABLET | Freq: Every day | ORAL | Status: DC
  Administered 2014-12-29 – 2014-12-30 (×2): 1250 mg via ORAL
  Filled 2014-12-28: qty 1
  Filled 2014-12-28: qty 3
  Filled 2014-12-28: qty 1

## 2014-12-28 MED ORDER — ATORVASTATIN CALCIUM 40 MG PO TABS
40.0000 mg | ORAL_TABLET | Freq: Every day | ORAL | Status: DC
  Administered 2014-12-28: 40 mg via ORAL
  Filled 2014-12-28 (×3): qty 1

## 2014-12-28 MED ORDER — FERROUS SULFATE 325 (65 FE) MG PO TABS
325.0000 mg | ORAL_TABLET | Freq: Two times a day (BID) | ORAL | Status: DC
  Administered 2014-12-28 – 2014-12-30 (×3): 325 mg via ORAL
  Filled 2014-12-28 (×6): qty 1

## 2014-12-28 MED ORDER — HYDRALAZINE HCL 25 MG PO TABS
25.0000 mg | ORAL_TABLET | Freq: Three times a day (TID) | ORAL | Status: DC
  Administered 2014-12-28 – 2014-12-30 (×5): 25 mg via ORAL
  Filled 2014-12-28 (×10): qty 1

## 2014-12-28 MED ORDER — DONEPEZIL HCL 10 MG PO TABS
10.0000 mg | ORAL_TABLET | Freq: Every day | ORAL | Status: DC
  Administered 2014-12-28 – 2014-12-29 (×2): 10 mg via ORAL
  Filled 2014-12-28 (×3): qty 1

## 2014-12-28 MED ORDER — SERTRALINE HCL 100 MG PO TABS
200.0000 mg | ORAL_TABLET | Freq: Every day | ORAL | Status: DC
  Administered 2014-12-29 – 2014-12-30 (×2): 200 mg via ORAL
  Filled 2014-12-28 (×2): qty 2

## 2014-12-28 MED ORDER — POTASSIUM CHLORIDE 10 MEQ/100ML IV SOLN
10.0000 meq | INTRAVENOUS | Status: AC
  Administered 2014-12-28 (×2): 10 meq via INTRAVENOUS
  Filled 2014-12-28 (×2): qty 100

## 2014-12-28 MED ORDER — POTASSIUM CHLORIDE CRYS ER 20 MEQ PO TBCR
40.0000 meq | EXTENDED_RELEASE_TABLET | Freq: Once | ORAL | Status: AC
  Administered 2014-12-28: 40 meq via ORAL
  Filled 2014-12-28: qty 2

## 2014-12-28 MED ORDER — PANTOPRAZOLE SODIUM 40 MG PO TBEC
40.0000 mg | DELAYED_RELEASE_TABLET | Freq: Every day | ORAL | Status: DC
  Administered 2014-12-29 – 2014-12-30 (×2): 40 mg via ORAL
  Filled 2014-12-28: qty 1

## 2014-12-28 MED ORDER — METOPROLOL TARTRATE 50 MG PO TABS
50.0000 mg | ORAL_TABLET | Freq: Every day | ORAL | Status: DC
  Administered 2014-12-29 – 2014-12-30 (×2): 50 mg via ORAL
  Filled 2014-12-28 (×2): qty 1

## 2014-12-28 MED ORDER — ONDANSETRON HCL 4 MG PO TABS: 4.0000 mg | ORAL_TABLET | Freq: Four times a day (QID) | ORAL | Status: DC | PRN

## 2014-12-28 MED ORDER — AMLODIPINE BESYLATE 10 MG PO TABS
10.0000 mg | ORAL_TABLET | Freq: Every day | ORAL | Status: DC
Start: 2014-12-29 — End: 2014-12-30
  Administered 2014-12-29 – 2014-12-30 (×2): 10 mg via ORAL
  Filled 2014-12-28 (×2): qty 1

## 2014-12-28 MED ORDER — ONDANSETRON HCL 4 MG/2ML IJ SOLN: 4.0000 mg | Freq: Three times a day (TID) | INTRAMUSCULAR | Status: DC | PRN

## 2014-12-28 NOTE — ED Notes (Signed)
Reported to [employer], that lab called with low HGb. 4.1  Pt. To be transferred to a RM

## 2014-12-28 NOTE — Progress Notes (Signed)
Admission note:  Arrival Method: ED stretcher  Mental Orientation: alert and oriented to person and place  Telemetry: not ordered  Assessment: completed  Skin: intact  IV: right AC with blood infusing; left hand saline locked  Pain: pt denies  Tubes: N/A Safety Measures: Patient Handbook has been given, and discussed the Fall Prevention worksheet. Left at bedside  Admission: Completed and admission orders have been written  6E Orientation: Patient has been oriented to the unit, staff and to the room.  Family: At the bedside

## 2014-12-28 NOTE — H&P (Signed)
[facility] History and Physical  [name] [name] OVF:[id number] DOB: [birth year] DOA: 12/28/2014  Referring physician: EDP PCP: Leola Brazil, MD   Chief Complaint: brought in family for low hemoglobin.   HPI: [name] [name] is a 81 y.o. male with prior h/o hypertension, colon mass, h/o rectal bleeding, CAD, dementia, was sent by his PCP for anemia, with a hemoglobin of 4. Most of the history was obtained from EDP, and family members. Pt has dementiaand is very hard of hearing. As per the family he has been feeling weak the last one week. But asking the patient he denies any pain or nausea, vomiting or abdominal pain.  Unclear if he is having GI bleed vs bone marrow failure. At this time patient and family does not want any work up for the colon mass or any intervention . They want the patient to get blood transfusion and be discharged home. On further labs, he was also found have severe hypokalemia. His UA was negative for infection and his INR is 1.24. He was referred to medical service for admission for blood transfusions. 2 units of prbc were ordered and oral potassium supplementation was given.    Review of Systems:  Poor historian and has dementia, and is very hard of hearing.    Past Medical History  Diagnosis Date  . Hypertension   . Coronary artery disease   . MI (myocardial infarction)   . AAA (abdominal aortic aneurysm)   . Dementia   . Sleep apnea   . GERD (gastroesophageal reflux disease)   . Arthritis   . RA (rheumatoid arthritis)   . Cancer     PROSTATE CA  . Anemia 12/28/2014  . History of rectal bleeding    Past Surgical History  Procedure Laterality Date  . Colonoscopy N/A 01/11/2013    Procedure: COLONOSCOPY;  Surgeon: Missy Sabins, MD;  Location: [facility];  Service: Endoscopy;  Laterality: N/A;  . Aorta doppler  06/26/2013    Fusiform aneurysm w/ current measurements of 3.8x4cm  . Cardiac catheterization  11/23/2008    Recommendation-CABG and  AVR  . Aortic valve replacement (avr)/coronary artery bypass grafting (cabg)  11/30/2008    AVR-32mm pericardial Magna tissue valve, model 3000, serial [id number]. CABG-x3. SVG to PDA, SVG to ramus, and SVG to distal LAD  . Cardiovascular stress test  08/02/2011    Perfusion defect seen in the inferobasal region consistant w/ diaphragmatic attenuation, pharmacological stress test w/o CP or EKG changes for ischemia.  . Transthoracic echocardiogram  01/06/2013    EF 55-60%, LA severely dilated,   . Coronary artery bypass graft     Social History:  reports that he has never smoked. He has never used smokeless tobacco. He reports that he does not drink alcohol or use illicit drugs.  No Known Allergies  Family History  Problem Relation Age of Onset  . Hypertension Brother   family has dementia and could not provide the any more family history and the family members at bedside did not report any.   Prior to Admission medications   Medication Sig Start Date End Date Taking? Authorizing Provider  amLODipine (NORVASC) 10 MG tablet Take 10 mg by mouth daily.   Yes Historical Provider, MD  Aspirin Effervescent (ALKA-SELTZER PO) Take 2 tablets by mouth daily as needed (pain).   Yes Historical Provider, MD  calcium carbonate (OS-CAL) 600 MG TABS tablet Take 600 mg by mouth daily with breakfast.   Yes Historical Provider, MD  donepezil (ARICEPT) 10  MG tablet Take 10 mg by mouth at bedtime.   Yes Historical Provider, MD  ferrous sulfate 325 (65 FE) MG tablet Take 1 tablet (325 mg total) by mouth 2 (two) times daily with a meal. 06/06/14  Yes Thurnell Lose, MD  furosemide (LASIX) 20 MG tablet Take 20 mg by mouth daily.   Yes Historical Provider, MD  hydrALAZINE (APRESOLINE) 25 MG tablet Take 1 tablet (25 mg total) by mouth every 8 (eight) hours. 06/06/14  Yes Thurnell Lose, MD  metoprolol (LOPRESSOR) 50 MG tablet Take 50 mg by mouth daily.    Yes Historical Provider, MD  pantoprazole (PROTONIX) 40 MG tablet  Take 1 tablet (40 mg total) by mouth daily. Switch for any other PPI at similar dose and frequency 04/15/14  Yes Debbe Odea, MD  potassium chloride (K-DUR) 10 MEQ tablet Take 2 tablets (20 mEq total) by mouth daily. Patient taking differently: Take 20 mEq by mouth at bedtime.  04/15/14  Yes Debbe Odea, MD  sertraline (ZOLOFT) 100 MG tablet Take 200 mg by mouth daily.   Yes Historical Provider, MD  simvastatin (ZOCOR) 80 MG tablet Take 40 mg by mouth at bedtime.   Yes Historical Provider, MD  Vitamin D, Ergocalciferol, (DRISDOL) 50000 UNITS CAPS capsule Take 50,000 Units by mouth every 7 (seven) days. Takes on Sunday   Yes Historical Provider, MD   Physical Exam: Filed Vitals:   12/28/14 1628 12/28/14 1639 12/28/14 1726 12/28/14 1840  BP: 169/77 174/77 168/71 168/80  Pulse: 62 62 62 65  Temp: 97.4 F (36.3 C) 97.4 F (36.3 C) 98.9 F (37.2 C) 97.4 F (36.3 C)  TempSrc: Oral Oral Oral Oral  Resp: 12  18 18   SpO2:  96% 96% 96%    Wt Readings from Last 3 Encounters:  06/05/14 77 kg (169 lb 12.1 oz)  04/15/14 77.1 kg (169 lb 15.6 oz)  02/18/14 68.4 kg (150 lb 12.7 oz)    General:  Appears calm and comfortable Eyes: PERRL, normal lids, irises &  Pale conjunctiva Neck: no LAD, masses or thyromegaly Cardiovascular: RRR, no m/r/g. 3+ edema. Respiratory: diminished air entry at bases.  no w/r/r. Normal respiratory effort. Abdomen: soft, ntnd Skin: no rash or induration seen on limited exam Musculoskeletal: 3+ pedal edema and no cyanosis or clubbing Neurologic: is oriented to place and person only.           Labs on Admission:  Basic Metabolic Panel:  Recent Labs Lab 12/28/14 1328  NA 138  K 2.8*  CL 99  CO2 32  GLUCOSE 94  BUN 12  CREATININE 1.15  CALCIUM 8.5   Liver Function Tests:  Recent Labs Lab 12/28/14 1328  AST 20  ALT 12  ALKPHOS 53  BILITOT 0.6  PROT 6.2  ALBUMIN 2.9*   No results for input(s): LIPASE, AMYLASE in the last 168 hours. No results for  input(s): AMMONIA in the last 168 hours. CBC:  Recent Labs Lab 12/28/14 1328  WBC 3.8*  HGB 4.1*  HCT 15.0*  MCV 61.0*  PLT 177   Cardiac Enzymes: No results for input(s): CKTOTAL, CKMB, CKMBINDEX, TROPONINI in the last 168 hours.  BNP (last 3 results) No results for input(s): BNP in the last 8760 hours.  ProBNP (last 3 results)  Recent Labs  02/14/14 0225  PROBNP 2143.0*    CBG: No results for input(s): GLUCAP in the last 168 hours.  Radiological Exams on Admission: No results found.  EKG: not done on admission.  Assessment/Plan Active Problems:   Anemia   Anemia, severe and microcytic. : Unclear etiology. Probably chronic blood loss from his colon mass.  Anemia panel will be sent to correct iron deficiency if present.  2units of prbc transfusion ordered and H&H ordered for tonight and in am.  Currently family and patient does not want any further work up other than blood transfusions.    Accelerated hypertension: rsume home meds and prn hydralazine will be ordered.    Mild leukopenia, normal platelets.    Hypokalemia: will be repleted both po and IV.  Recheck later tonight.    Failure to thrive: nutrition consult and physical therapy eval. As per the family, he ambulates using a cane and a walker.    Generalized weakness from a combination of hypokalemia and anemia.   CAD: Pt denies any chest pain or sob.   Dementia: no behavioral issues. Resume aricept.    Code Status: DNR DVT Prophylaxis: Family Communication: MUltiple family members at bedside Disposition Plan: admitted to [facility].   Time spent: 55 minutes, 50%time spent face to face with patient , family and co ordinating care related to the above assessment and plan.   [facility] Pager [phone]

## 2014-12-28 NOTE — ED Provider Notes (Signed)
CSN: [id number]     Arrival date & time 12/28/14  1306 History   First MD Initiated Contact with Patient 12/28/14 1510     Chief Complaint  Patient presents with  . low hgb       HPI Patient was sent to the hospital by his primary care doctor for a low hemoglobin.  Patient has been feeling weak and not well for the last few days.  Patient has history of chronic anemia.  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  The family is not aware if he's having melena or hematochezia.  Patient has history of dementia is a poor historian.  Past Medical History  Diagnosis Date  . Hypertension   . Coronary artery disease   . MI (myocardial infarction)   . AAA (abdominal aortic aneurysm)   . Dementia   . Sleep apnea   . GERD (gastroesophageal reflux disease)   . Arthritis   . RA (rheumatoid arthritis)   . Cancer     PROSTATE CA  . Anemia 12/28/2014  . History of rectal bleeding    Past Surgical History  Procedure Laterality Date  . Colonoscopy N/A 01/11/2013    Procedure: COLONOSCOPY;  Surgeon: Missy Sabins, MD;  Location: [facility];  Service: Endoscopy;  Laterality: N/A;  . Aorta doppler  06/26/2013    Fusiform aneurysm w/ current measurements of 3.8x4cm  . Cardiac catheterization  11/23/2008    Recommendation-CABG and AVR  . Aortic valve replacement (avr)/coronary artery bypass grafting (cabg)  11/30/2008    AVR-66mm pericardial Magna tissue valve, model 3000, serial [id number]. CABG-x3. SVG to PDA, SVG to ramus, and SVG to distal LAD  . Cardiovascular stress test  08/02/2011    Perfusion defect seen in the inferobasal region consistant w/ diaphragmatic attenuation, pharmacological stress test w/o CP or EKG changes for ischemia.  . Transthoracic echocardiogram  01/06/2013    EF 55-60%, LA severely dilated,   . Coronary artery bypass graft     Family History  Problem Relation Age of Onset  . Hypertension Brother    History  Substance Use Topics  . Smoking status: Never Smoker   .  Smokeless tobacco: Never Used  . Alcohol Use: No    Review of Systems  Unable to perform ROS: Dementia      Allergies  Review of patient's allergies indicates no known allergies.  Home Medications   Prior to Admission medications   Medication Sig Start Date End Date Taking? Authorizing Provider  amLODipine (NORVASC) 10 MG tablet Take 10 mg by mouth daily.   Yes Historical Provider, MD  calcium carbonate (OS-CAL) 600 MG TABS tablet Take 600 mg by mouth daily with breakfast.   Yes Historical Provider, MD  donepezil (ARICEPT) 10 MG tablet Take 10 mg by mouth at bedtime.   Yes Historical Provider, MD  ferrous sulfate 325 (65 FE) MG tablet Take 1 tablet (325 mg total) by mouth 2 (two) times daily with a meal. 06/06/14  Yes Thurnell Lose, MD  furosemide (LASIX) 20 MG tablet Take 20 mg by mouth daily.   Yes Historical Provider, MD  hydrALAZINE (APRESOLINE) 25 MG tablet Take 1 tablet (25 mg total) by mouth every 8 (eight) hours. 06/06/14  Yes Thurnell Lose, MD  metoprolol (LOPRESSOR) 50 MG tablet Take 50 mg by mouth daily.    Yes Historical Provider, MD  pantoprazole (PROTONIX) 40 MG tablet Take 1 tablet (40 mg total) by mouth daily. Switch for any other  PPI at similar dose and frequency 04/15/14  Yes Debbe Odea, MD  potassium chloride (K-DUR) 10 MEQ tablet Take 2 tablets (20 mEq total) by mouth daily. Patient taking differently: Take 20 mEq by mouth at bedtime.  04/15/14  Yes Debbe Odea, MD  sertraline (ZOLOFT) 100 MG tablet Take 200 mg by mouth daily.   Yes Historical Provider, MD  simvastatin (ZOCOR) 80 MG tablet Take 40 mg by mouth at bedtime.   Yes Historical Provider, MD  Vitamin D, Ergocalciferol, (DRISDOL) 50000 UNITS CAPS capsule Take 50,000 Units by mouth every 7 (seven) days. Takes on Sunday   Yes Historical Provider, MD  iron polysaccharides (NU-IRON) 150 MG capsule Take 1 capsule (150 mg total) by mouth 2 (two) times daily. 12/30/14   Reyne Dumas, MD   BP 119/66 mmHg  Pulse  81  Temp(Src) 98.3 F (36.8 C) (Oral)  Resp 20  Ht 5\' 9"  (1.753 m)  Wt 166 lb 14.2 oz (75.7 kg)  BMI 24.63 kg/m2  SpO2 96% Physical Exam  Constitutional: He appears well-developed and well-nourished. No distress.  HENT:  Head: Normocephalic and atraumatic.  Eyes: Pupils are equal, round, and reactive to light.  Neck: Normal range of motion.  Cardiovascular: Normal rate and intact distal pulses.   Pulmonary/Chest: No respiratory distress.  Abdominal: Normal appearance. He exhibits no distension. There is no tenderness. There is no rebound.  Musculoskeletal: Normal range of motion.  Neurological: He is alert. No cranial nerve deficit.  Skin: Skin is warm and dry. No rash noted.  Psychiatric: He has a normal mood and affect. His behavior is normal.  Nursing note and vitals reviewed.   ED Course  Procedures (including critical care time) Labs Review Labs Reviewed  COMPREHENSIVE METABOLIC PANEL - Abnormal; Notable for the following:    Potassium 2.8 (*)    Albumin 2.9 (*)    GFR calc non Af Amer 55 (*)    GFR calc Af Amer 64 (*)    All other components within normal limits  CBC - Abnormal; Notable for the following:    WBC 3.8 (*)    RBC 2.46 (*)    Hemoglobin 4.1 (*)    HCT 15.0 (*)    MCV 61.0 (*)    MCH 16.7 (*)    MCHC 27.3 (*)    RDW 19.7 (*)    All other components within normal limits  PROTIME-INR - Abnormal; Notable for the following:    Prothrombin Time 15.7 (*)    All other components within normal limits  URINALYSIS, ROUTINE W REFLEX MICROSCOPIC - Abnormal; Notable for the following:    APPearance CLOUDY (*)    All other components within normal limits  HEMOGLOBIN AND HEMATOCRIT, BLOOD - Abnormal; Notable for the following:    Hemoglobin 5.5 (*)    HCT 19.0 (*)    All other components within normal limits  HEMOGLOBIN AND HEMATOCRIT, BLOOD - Abnormal; Notable for the following:    Hemoglobin 5.8 (*)    HCT 19.6 (*)    All other components within normal  limits  BASIC METABOLIC PANEL - Abnormal; Notable for the following:    Potassium 2.9 (*)    Glucose, Bld 148 (*)    GFR calc non Af Amer 60 (*)    GFR calc Af Amer 70 (*)    All other components within normal limits  BASIC METABOLIC PANEL - Abnormal; Notable for the following:    Potassium 3.4 (*)    GFR calc non  Af Amer 75 (*)    GFR calc Af Amer 87 (*)    Anion gap 4 (*)    All other components within normal limits  FERRITIN - Abnormal; Notable for the following:    Ferritin 17 (*)    All other components within normal limits  RETICULOCYTES - Abnormal; Notable for the following:    RBC. 2.96 (*)    All other components within normal limits  CBC - Abnormal; Notable for the following:    RBC 3.94 (*)    Hemoglobin 8.4 (*)    HCT 27.3 (*)    MCV 69.3 (*)    MCH 21.3 (*)    RDW 24.7 (*)    All other components within normal limits  COMPREHENSIVE METABOLIC PANEL - Abnormal; Notable for the following:    Total Protein 5.7 (*)    Albumin 2.6 (*)    GFR calc non Af Amer 61 (*)    GFR calc Af Amer 71 (*)    Anion gap 4 (*)    All other components within normal limits  MAGNESIUM  VITAMIN B12  FOLATE  IRON AND TIBC  POC OCCULT BLOOD, ED  OCCULT BLOOD, POC DEVICE  TYPE AND SCREEN  PREPARE RBC (CROSSMATCH)  PREPARE RBC (CROSSMATCH)    Imaging Review No results found.    MDM   Final diagnoses:  Anemia, unspecified anemia type  Transfusion (red blood cell) associated hemochromatosis        Dot Lanes, MD 12/30/14 [phone]

## 2014-12-28 NOTE — ED Notes (Signed)
Pt sent here by his doctor for low hgb. sts very weak and not feeling well.

## 2014-12-29 DIAGNOSIS — D500 Iron deficiency anemia secondary to blood loss (chronic): Principal | ICD-10-CM

## 2014-12-29 DIAGNOSIS — E43 Unspecified severe protein-calorie malnutrition: Secondary | ICD-10-CM | POA: Insufficient documentation

## 2014-12-29 LAB — HEMOGLOBIN AND HEMATOCRIT, BLOOD
HCT: 19.6 % — ABNORMAL LOW (ref 39.0–52.0)
Hemoglobin: 5.8 g/dL — CL (ref 13.0–17.0)

## 2014-12-29 LAB — BASIC METABOLIC PANEL
Anion gap: 4 — ABNORMAL LOW (ref 5–15)
BUN: 11 mg/dL (ref 6–23)
CO2: 32 mmol/L (ref 19–32)
Calcium: 8.4 mg/dL (ref 8.4–10.5)
Chloride: 103 mmol/L (ref 96–112)
Creatinine, Ser: 0.88 mg/dL (ref 0.50–1.35)
GFR calc Af Amer: 87 mL/min — ABNORMAL LOW (ref >90)
GFR calc non Af Amer: 75 mL/min — ABNORMAL LOW (ref >90)
Glucose, Bld: 89 mg/dL (ref 70–99)
POTASSIUM: 3.4 mmol/L — AB (ref 3.5–5.1)
SODIUM: 139 mmol/L (ref 135–145)

## 2014-12-29 LAB — VITAMIN B12: Vitamin B-12: 699 pg/mL (ref 211–911)

## 2014-12-29 LAB — IRON AND TIBC
Iron: 113 ug/dL (ref 42–165)
SATURATION RATIOS: 34 % (ref 20–55)
TIBC: 330 ug/dL (ref 215–435)
UIBC: 217 ug/dL (ref 125–400)

## 2014-12-29 LAB — FOLATE: Folate: 18.9 ng/mL

## 2014-12-29 LAB — PREPARE RBC (CROSSMATCH)

## 2014-12-29 LAB — FERRITIN: Ferritin: 17 ng/mL — ABNORMAL LOW (ref 22–322)

## 2014-12-29 MED ORDER — SODIUM CHLORIDE 0.9 % IV SOLN: Freq: Once | INTRAVENOUS | Status: DC

## 2014-12-29 MED ORDER — POTASSIUM CHLORIDE CRYS ER 20 MEQ PO TBCR
40.0000 meq | EXTENDED_RELEASE_TABLET | Freq: Once | ORAL | Status: AC
  Administered 2014-12-29: 40 meq via ORAL
  Filled 2014-12-29: qty 2

## 2014-12-29 MED ORDER — HALOPERIDOL LACTATE 5 MG/ML IJ SOLN
1.0000 mg | Freq: Once | INTRAMUSCULAR | Status: AC
  Administered 2014-12-29: 1 mg via INTRAVENOUS
  Filled 2014-12-29: qty 1
  Filled 2014-12-29: qty 0.2

## 2014-12-29 MED ORDER — BOOST PLUS PO LIQD
237.0000 mL | Freq: Three times a day (TID) | ORAL | Status: DC
  Administered 2014-12-30: 237 mL via ORAL
  Filled 2014-12-29 (×7): qty 237

## 2014-12-29 MED ORDER — FUROSEMIDE 10 MG/ML IJ SOLN
20.0000 mg | Freq: Once | INTRAMUSCULAR | Status: AC
  Administered 2014-12-29: 20 mg via INTRAVENOUS
  Filled 2014-12-29: qty 2

## 2014-12-29 NOTE — Progress Notes (Addendum)
[facility] PROGRESS NOTE  Josia Cueva ZOX:[id number] DOB: 1925-05-30 DOA: 12/28/2014 PCP: Leola Brazil, MD  Assessment/Plan: Active Problems:   Anemia   Anemia, severe and microcytic. :  chronic blood loss from his colon mass.  Anemia panel will be sent to correct iron deficiency if present.  2units of prbc , hg 4.1>5.8, repeat 2 more units today   Lasix inbetween units  Currently family and patient does not want any further work up other than blood transfusions.    Accelerated hypertension: Cont norvasc , hydralazine,  and prn hydralazine will be ordered.    Mild leukopenia, normal platelets.    Hypokalemia:  Replete   Failure to thrive: nutrition consult and physical therapy eval. As per the family, he ambulates using a cane and a walker.    Generalized weakness from a combination of hypokalemia and anemia. PT eval   CAD: Pt denies any chest pain or sob.   Dementia: continue  Aricept.haldol prn for sundowning    Code Status: DNR  Family Communication: family updated about patient's clinical progress Disposition Plan:  As above    Brief narrative: Tydus Sanmiguel is a 81 y.o. male with prior h/o hypertension, colon mass, h/o rectal bleeding, CAD, dementia, was sent by his PCP for anemia, with a hemoglobin of 4. Most of the history was obtained from EDP, and family members. Pt has dementiaand is very hard of hearing. As per the family he has been feeling weak the last one week. But asking the patient he denies any pain or nausea, vomiting or abdominal pain. Unclear if he is having GI bleed vs bone marrow failure. At this time patient and family does not want any work up for the colon mass or any intervention . They want the patient to get blood transfusion and be discharged home. On further labs, he was also found have severe hypokalemia. His UA was negative for infection and his INR is 1.24. He was referred to medical service for admission for  blood transfusions. 2 units of prbc were ordered and oral potassium supplementation was given.   Consultants:  None   Procedures:  None   Antibiotics: None   HPI/Subjective: Hemodynamically stable overnight ,  No further bleeding   Objective: Filed Vitals:   12/29/14 0500 12/29/14 0908 12/29/14 0929 12/29/14 1241  BP: 142/75 136/69 129/66 153/77  Pulse: 74 78 80 75  Temp: 97.8 F (36.6 C) 99.4 F (37.4 C) 99.1 F (37.3 C) 98.9 F (37.2 C)  TempSrc: Oral Oral Oral Oral  Resp: 16 17 18 17   SpO2: 90% 93% 93%     Intake/Output Summary (Last 24 hours) at 12/29/14 1310 Last data filed at 12/29/14 1241  Gross per 24 hour  Intake   2990 ml  Output    725 ml  Net   2265 ml    Exam:  General: No acute respiratory distress Lungs: Clear to auscultation bilaterally without wheezes or crackles Cardiovascular: Regular rate and rhythm without murmur gallop or rub normal S1 and S2 Abdomen: Nontender, nondistended, soft, bowel sounds positive, no rebound, no ascites, no appreciable mass Extremities: No significant cyanosis, clubbing, or edema bilateral lower extremities      Data Reviewed: Basic Metabolic Panel:  Recent Labs Lab 12/28/14 1328 12/28/14 2038 12/29/14 0550  NA 138 136 139  K 2.8* 2.9* 3.4*  CL 99 100 103  CO2 32 31 32  GLUCOSE 94 148* 89  BUN 12 11 11   CREATININE 1.15 1.07 0.88  CALCIUM 8.5 8.5 8.4  MG  --  1.8  --     Liver Function Tests:  Recent Labs Lab 12/28/14 1328  AST 20  ALT 12  ALKPHOS 53  BILITOT 0.6  PROT 6.2  ALBUMIN 2.9*   No results for input(s): LIPASE, AMYLASE in the last 168 hours. No results for input(s): AMMONIA in the last 168 hours.  CBC:  Recent Labs Lab 12/28/14 1328 12/28/14 2038 12/29/14 0550  WBC 3.8*  --   --   HGB 4.1* 5.5* 5.8*  HCT 15.0* 19.0* 19.6*  MCV 61.0*  --   --   PLT 177  --   --     Cardiac Enzymes: No results for input(s): CKTOTAL, CKMB, CKMBINDEX, TROPONINI in the last 168  hours. BNP (last 3 results) No results for input(s): BNP in the last 8760 hours.  ProBNP (last 3 results)  Recent Labs  02/14/14 0225  PROBNP 2143.0*      CBG: No results for input(s): GLUCAP in the last 168 hours.  No results found for this or any previous visit (from the past 240 hour(s)).   Studies: No results found.  Scheduled Meds: . sodium chloride   Intravenous Once  . amLODipine  10 mg Oral Daily  . atorvastatin  40 mg Oral q1800  . calcium carbonate  1,250 mg Oral Q breakfast  . donepezil  10 mg Oral QHS  . feeding supplement (ENSURE COMPLETE)  237 mL Oral BID BM  . ferrous sulfate  325 mg Oral BID WC  . hydrALAZINE  25 mg Oral 3 times per day  . metoprolol  50 mg Oral Daily  . pantoprazole  40 mg Oral Daily  . sertraline  200 mg Oral Daily   Continuous Infusions:   Active Problems:   Anemia    Time spent: 40 minutes   [facility] Pager [phone]. If 7PM-7AM, please contact night-coverage at [email or web address], password [facility] 12/29/2014, 1:10 PM  LOS: 1 day

## 2014-12-29 NOTE — Progress Notes (Signed)
INITIAL NUTRITION ASSESSMENT  Pt meets criteria for SEVERE MALNUTRITION in the context of chronic illness as evidenced by energy intake </= 75% for >/= 1 month, moderate to severe muscle mass loss, and severe fluid accumulation.  DOCUMENTATION CODES Per approved criteria  -Severe malnutrition in the context of chronic illness   INTERVENTION: Discontinue Ensure.  Provide Boost Plus po TID, each supplement provides 360 kcal and 14 grams of protein.  Recommend obtaining new weight to fully assess weight trends.  Encourage adequate PO intake.  NUTRITION DIAGNOSIS: Malnutrition related to inadequate oral intake as evidenced by moderate to severe muscle mass loss and severe fluid accumulation.   Goal: Pt to meet >/= 90% of their estimated nutrition needs   Monitor:  PO intake, weight trends, labs, I/O's  Reason for Assessment: MST  81 y.o. male  Admitting Dx: Anemia  ASSESSMENT: Pt with prior h/o hypertension, colon mass, h/o rectal bleeding, CAD, dementia, was sent by his PCP for anemia.  Pt reports his appetite is fine. Pt reports eating most of his meal this AM. No percent meal completion recorded. Family present at bedside. They report pt has not been eating well at home. Family unable to recall the last time pt had a full meal. They reports pt would take bites out of food or snack on it. Pt however has bee drinking Boost at home with usual consumption of 3-4 a day as pt has not been eating well. No new weight recorded. Recommend obtaining new weight. Pt reports usual body weight of 156 lbs. Pt currently has Ensure ordered. RD to discontinue Ensure and order Boost Plus. Pt was encouraged to eat his food at meals and to drink his supplements.   Nutrition Focused Physical Exam:  Subcutaneous Fat:  Orbital Region: N/A Upper Arm Region: N/A Thoracic and Lumbar Region: WNL  Muscle:  Temple Region: N/A Clavicle Bone Region: Moderate to Severe depletion Clavicle and Acromion  Bone Region: Moderate to Severe depletion Scapular Bone Region: N/A Dorsal Hand: N/A Patellar Region: WNL Anterior Thigh Region: WNL Posterior Calf Region: WNL  Edema: +3 LE edema  Labs: Low potassium and GFR.  Height: Ht Readings from Last 1 Encounters:  06/05/14 5\' 9"  (1.753 m)    Weight: Wt Readings from Last 1 Encounters:  06/05/14 169 lb 12.1 oz (77 kg)  *No new weight recorded*  Ideal Body Weight: 160 lbs  % Ideal Body Weight: 106%  Wt Readings from Last 10 Encounters:  06/05/14 169 lb 12.1 oz (77 kg)  04/15/14 169 lb 15.6 oz (77.1 kg)  02/18/14 150 lb 12.7 oz (68.4 kg)  07/08/13 164 lb 3.2 oz (74.481 kg)  04/29/13 174 lb (78.926 kg)  03/03/13 176 lb (79.833 kg)  01/31/13 180 lb (81.647 kg)  01/05/13 192 lb 10.9 oz (87.4 kg)    Usual Body Weight: 156 lbs (per pt report)  % Usual Body Weight: ---  BMI:  Body Mass Index: 25.16 kg/(m^2)  Estimated Nutriti2onal Needs: Kcal: 5462-7035 Protein: 85-95 grams Fluid: 1.7 - 1.95  Skin: +3 LE edema  Diet Order: Diet Heart  EDUCATION NEEDS: -No education needs identified at this time   Intake/Output Summary (Last 24 hours) at 12/29/14 1033 Last data filed at 12/29/14 0908  Gross per 24 hour  Intake   2655 ml  Output    725 ml  Net   1930 ml    Last BM: 2/22  Labs:   Recent Labs Lab 12/28/14 1328 12/28/14 2038 12/29/14 0550  NA 138  136 139  K 2.8* 2.9* 3.4*  CL 99 100 103  CO2 32 31 32  BUN 12 11 11   CREATININE 1.15 1.07 0.88  CALCIUM 8.5 8.5 8.4  MG  --  1.8  --   GLUCOSE 94 148* 89    CBG (last 3)  No results for input(s): GLUCAP in the last 72 hours.  Scheduled Meds: . sodium chloride   Intravenous Once  . amLODipine  10 mg Oral Daily  . atorvastatin  40 mg Oral q1800  . calcium carbonate  1,250 mg Oral Q breakfast  . donepezil  10 mg Oral QHS  . feeding supplement (ENSURE COMPLETE)  237 mL Oral BID BM  . ferrous sulfate  325 mg Oral BID WC  . furosemide  20 mg Intravenous Once   . hydrALAZINE  25 mg Oral 3 times per day  . metoprolol  50 mg Oral Daily  . pantoprazole  40 mg Oral Daily  . sertraline  200 mg Oral Daily    Continuous Infusions:   Past Medical History  Diagnosis Date  . Hypertension   . Coronary artery disease   . MI (myocardial infarction)   . AAA (abdominal aortic aneurysm)   . Dementia   . Sleep apnea   . GERD (gastroesophageal reflux disease)   . Arthritis   . RA (rheumatoid arthritis)   . Cancer     PROSTATE CA  . Anemia 12/28/2014  . History of rectal bleeding     Past Surgical History  Procedure Laterality Date  . Colonoscopy N/A 01/11/2013    Procedure: COLONOSCOPY;  Surgeon: Missy Sabins, MD;  Location: [facility];  Service: Endoscopy;  Laterality: N/A;  . Aorta doppler  06/26/2013    Fusiform aneurysm w/ current measurements of 3.8x4cm  . Cardiac catheterization  11/23/2008    Recommendation-CABG and AVR  . Aortic valve replacement (avr)/coronary artery bypass grafting (cabg)  11/30/2008    AVR-68mm pericardial Magna tissue valve, model 3000, serial [id number]. CABG-x3. SVG to PDA, SVG to ramus, and SVG to distal LAD  . Cardiovascular stress test  08/02/2011    Perfusion defect seen in the inferobasal region consistant w/ diaphragmatic attenuation, pharmacological stress test w/o CP or EKG changes for ischemia.  . Transthoracic echocardiogram  01/06/2013    EF 55-60%, LA severely dilated,   . Coronary artery bypass graft      Kallie Locks, MS, RD, LDN Pager # [phone] After hours/ weekend pager # [phone]

## 2014-12-29 NOTE — Progress Notes (Signed)
UR Completed.  [phone]  

## 2014-12-30 LAB — TYPE AND SCREEN
ABO/RH(D): A POS
ANTIBODY SCREEN: NEGATIVE
UNIT DIVISION: 0
Unit division: 0
Unit division: 0
Unit division: 0

## 2014-12-30 LAB — COMPREHENSIVE METABOLIC PANEL
ALBUMIN: 2.6 g/dL — AB (ref 3.5–5.2)
ALK PHOS: 54 U/L (ref 39–117)
ALT: 11 U/L (ref 0–53)
AST: 19 U/L (ref 0–37)
Anion gap: 4 — ABNORMAL LOW (ref 5–15)
BUN: 11 mg/dL (ref 6–23)
CHLORIDE: 104 mmol/L (ref 96–112)
CO2: 28 mmol/L (ref 19–32)
Calcium: 8.6 mg/dL (ref 8.4–10.5)
Creatinine, Ser: 1.06 mg/dL (ref 0.50–1.35)
GFR calc Af Amer: 71 mL/min — ABNORMAL LOW (ref >90)
GFR calc non Af Amer: 61 mL/min — ABNORMAL LOW (ref >90)
GLUCOSE: 82 mg/dL (ref 70–99)
Potassium: 3.7 mmol/L (ref 3.5–5.1)
Sodium: 136 mmol/L (ref 135–145)
Total Bilirubin: 1 mg/dL (ref 0.3–1.2)
Total Protein: 5.7 g/dL — ABNORMAL LOW (ref 6.0–8.3)

## 2014-12-30 LAB — CBC
HEMATOCRIT: 27.3 % — AB (ref 39.0–52.0)
Hemoglobin: 8.4 g/dL — ABNORMAL LOW (ref 13.0–17.0)
MCH: 21.3 pg — ABNORMAL LOW (ref 26.0–34.0)
MCHC: 30.8 g/dL (ref 30.0–36.0)
MCV: 69.3 fL — AB (ref 78.0–100.0)
PLATELETS: 153 10*3/uL (ref 150–400)
RBC: 3.94 MIL/uL — AB (ref 4.22–5.81)
RDW: 24.7 % — ABNORMAL HIGH (ref 11.5–15.5)
WBC: 6.2 10*3/uL (ref 4.0–10.5)

## 2014-12-30 MED ORDER — POLYSACCHARIDE IRON COMPLEX 150 MG PO CAPS: 150.0000 mg | ORAL_CAPSULE | Freq: Two times a day (BID) | ORAL | Status: DC
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
## 2014-12-30 NOTE — Discharge Summary (Signed)
Physician Discharge Summary  [name] [name] MRN: [id number] DOB/AGE: Jun 15, 1925 81 y.o.  PCP: Leola Brazil, MD   Admit date: 12/28/2014 Discharge date: 12/30/2014  Discharge Diagnoses:     Anemia   Protein-calorie malnutrition, severe History of colon cancer DO NOT RESUSCITATE   Follow-up recommendations Follow-up with PCP in 5-7 days Follow-up CBC in 5-7 days     Medication List    STOP taking these medications        ALKA-SELTZER PO      TAKE these medications        amLODipine 10 MG tablet  Commonly known as:  NORVASC  Take 10 mg by mouth daily.     calcium carbonate 600 MG Tabs tablet  Commonly known as:  OS-CAL  Take 600 mg by mouth daily with breakfast.     donepezil 10 MG tablet  Commonly known as:  ARICEPT  Take 10 mg by mouth at bedtime.     ferrous sulfate 325 (65 FE) MG tablet  Take 1 tablet (325 mg total) by mouth 2 (two) times daily with a meal.     furosemide 20 MG tablet  Commonly known as:  LASIX  Take 20 mg by mouth daily.     hydrALAZINE 25 MG tablet  Commonly known as:  APRESOLINE  Take 1 tablet (25 mg total) by mouth every 8 (eight) hours.     iron polysaccharides 150 MG capsule  Commonly known as:  NU-IRON  Take 1 capsule (150 mg total) by mouth 2 (two) times daily.     metoprolol 50 MG tablet  Commonly known as:  LOPRESSOR  Take 50 mg by mouth daily.     pantoprazole 40 MG tablet  Commonly known as:  PROTONIX  Take 1 tablet (40 mg total) by mouth daily. Switch for any other PPI at similar dose and frequency     potassium chloride 10 MEQ tablet  Commonly known as:  K-DUR  Take 2 tablets (20 mEq total) by mouth daily.     sertraline 100 MG tablet  Commonly known as:  ZOLOFT  Take 200 mg by mouth daily.     simvastatin 80 MG tablet  Commonly known as:  ZOCOR  Take 40 mg by mouth at bedtime.     Vitamin D (Ergocalciferol) 50000 UNITS Caps capsule  Commonly known as:  DRISDOL  Take 50,000 Units by mouth  every 7 (seven) days. Takes on Sunday        Discharge Condition: Stable   Disposition: 01-Home or Self Care   Consults: None    Significant Diagnostic Studies: No results found.    Microbiology: No results found for this or any previous visit (from the past 240 hour(s)).   Labs: Results for orders placed or performed during the hospital encounter of 12/28/14 (from the past 48 hour(s))  Comprehensive metabolic panel     Status: Abnormal   Collection Time: 12/28/14  1:28 PM  Result Value Ref Range   Sodium 138 135 - 145 mmol/L   Potassium 2.8 (L) 3.5 - 5.1 mmol/L   Chloride 99 96 - 112 mmol/L   CO2 32 19 - 32 mmol/L   Glucose, Bld 94 70 - 99 mg/dL   BUN 12 6 - 23 mg/dL   Creatinine, Ser 1.15 0.50 - 1.35 mg/dL   Calcium 8.5 8.4 - 10.5 mg/dL   Total Protein 6.2 6.0 - 8.3 g/dL   Albumin 2.9 (L) 3.5 - 5.2 g/dL   AST  20 0 - 37 U/L   ALT 12 0 - 53 U/L   Alkaline Phosphatase 53 39 - 117 U/L   Total Bilirubin 0.6 0.3 - 1.2 mg/dL   GFR calc non Af Amer 55 (L) >90 mL/min   GFR calc Af Amer 64 (L) >90 mL/min    Comment: (NOTE) The eGFR has been calculated using the CKD EPI equation. This calculation has not been validated in all clinical situations. eGFR's persistently <90 mL/min signify possible Chronic Kidney Disease.    Anion gap 7 5 - 15  CBC     Status: Abnormal   Collection Time: 12/28/14  1:28 PM  Result Value Ref Range   WBC 3.8 (L) 4.0 - 10.5 K/uL   RBC 2.46 (L) 4.22 - 5.81 MIL/uL   Hemoglobin 4.1 (LL) 13.0 - 17.0 g/dL    Comment: REPEATED TO VERIFY CRITICAL RESULT CALLED TO, READ BACK BY AND VERIFIED WITH: Arnetha Massy AT 1425 12/28/14 BY ZBEECH.    HCT 15.0 (L) 39.0 - 52.0 %   MCV 61.0 (L) 78.0 - 100.0 fL   MCH 16.7 (L) 26.0 - 34.0 pg   MCHC 27.3 (L) 30.0 - 36.0 g/dL   RDW 19.7 (H) 11.5 - 15.5 %   Platelets 177 150 - 400 K/uL    Comment: PLATELET COUNT CONFIRMED BY SMEAR REPEATED TO VERIFY   Protime-INR (if patient is taking Coumadin)      Status: Abnormal   Collection Time: 12/28/14  1:28 PM  Result Value Ref Range   Prothrombin Time 15.7 (H) 11.6 - 15.2 seconds   INR 1.24 0.00 - 1.49  Urinalysis, Routine w reflex microscopic     Status: Abnormal   Collection Time: 12/28/14  1:40 PM  Result Value Ref Range   Color, Urine YELLOW YELLOW   APPearance CLOUDY (A) CLEAR   Specific Gravity, Urine 1.006 1.005 - 1.030   pH 7.5 5.0 - 8.0   Glucose, UA NEGATIVE NEGATIVE mg/dL   Hgb urine dipstick NEGATIVE NEGATIVE   Bilirubin Urine NEGATIVE NEGATIVE   Ketones, ur NEGATIVE NEGATIVE mg/dL   Protein, ur NEGATIVE NEGATIVE mg/dL   Urobilinogen, UA 0.2 0.0 - 1.0 mg/dL   Nitrite NEGATIVE NEGATIVE   Leukocytes, UA NEGATIVE NEGATIVE    Comment: MICROSCOPIC NOT DONE ON URINES WITH NEGATIVE PROTEIN, BLOOD, LEUKOCYTES, NITRITE, OR GLUCOSE <1000 mg/dL.  Type and screen     Status: None   Collection Time: 12/28/14  1:42 PM  Result Value Ref Range   ABO/RH(D) A POS    Antibody Screen NEG    Sample Expiration 12/31/2014    Unit Number [id number]    Blood Component Type RED CELLS,LR    Unit division 00    Status of Unit ISSUED,FINAL    Transfusion Status OK TO TRANSFUSE    Crossmatch Result Compatible    Unit Number [id number]    Blood Component Type RED CELLS,LR    Unit division 00    Status of Unit ISSUED,FINAL    Transfusion Status OK TO TRANSFUSE    Crossmatch Result Compatible    Unit Number [id number]    Blood Component Type RED CELLS,LR    Unit division 00    Status of Unit ISSUED,FINAL    Transfusion Status OK TO TRANSFUSE    Crossmatch Result Compatible    Unit Number [id number]    Blood Component Type RED CELLS,LR    Unit division 00    Status of Unit ISSUED,FINAL    Transfusion Status OK  TO TRANSFUSE    Crossmatch Result Compatible   Prepare RBC     Status: None   Collection Time: 12/28/14  3:18 PM  Result Value Ref Range   Order Confirmation ORDER PROCESSED BY BLOOD BANK   Occult blood, poc device      Status: None   Collection Time: 12/28/14  4:58 PM  Result Value Ref Range   Fecal Occult Bld NEGATIVE NEGATIVE  Hemoglobin and hematocrit, blood     Status: Abnormal   Collection Time: 12/28/14  8:38 PM  Result Value Ref Range   Hemoglobin 5.5 (LL) 13.0 - 17.0 g/dL    Comment: REPEATED TO VERIFY SPECIMEN CHECKED FOR CLOTS CRITICAL VALUE NOTED.  VALUE IS CONSISTENT WITH PREVIOUSLY REPORTED AND CALLED VALUE.    HCT 19.0 (L) 39.0 - 16.1 %  Basic metabolic panel     Status: Abnormal   Collection Time: 12/28/14  8:38 PM  Result Value Ref Range   Sodium 136 135 - 145 mmol/L   Potassium 2.9 (L) 3.5 - 5.1 mmol/L   Chloride 100 96 - 112 mmol/L   CO2 31 19 - 32 mmol/L   Glucose, Bld 148 (H) 70 - 99 mg/dL   BUN 11 6 - 23 mg/dL   Creatinine, Ser 1.07 0.50 - 1.35 mg/dL   Calcium 8.5 8.4 - 10.5 mg/dL   GFR calc non Af Amer 60 (L) >90 mL/min   GFR calc Af Amer 70 (L) >90 mL/min    Comment: (NOTE) The eGFR has been calculated using the CKD EPI equation. This calculation has not been validated in all clinical situations. eGFR's persistently <90 mL/min signify possible Chronic Kidney Disease.    Anion gap 5 5 - 15  Magnesium     Status: None   Collection Time: 12/28/14  8:38 PM  Result Value Ref Range   Magnesium 1.8 1.5 - 2.5 mg/dL  Vitamin B12     Status: None   Collection Time: 12/28/14  9:28 PM  Result Value Ref Range   Vitamin B-12 699 211 - 911 pg/mL    Comment: Performed at [facility]  Folate     Status: None   Collection Time: 12/28/14  9:28 PM  Result Value Ref Range   Folate 18.9 ng/mL    Comment: (NOTE) Reference Ranges        Deficient:       0.4 - 3.3 ng/mL        Indeterminate:   3.4 - 5.4 ng/mL        Normal:              > 5.4 ng/mL Performed at [place] 02/23 AT 1207: PREVIOUSLY REPORTED AS 18.9   Iron and TIBC     Status: None   Collection Time: 12/28/14  9:28 PM  Result Value Ref Range   Iron 113 42 - 165 ug/dL   TIBC 330  215 - 435 ug/dL   Saturation Ratios 34 20 - 55 %   UIBC 217 125 - 400 ug/dL    Comment: Performed at [facility]  Ferritin     Status: Abnormal   Collection Time: 12/28/14  9:28 PM  Result Value Ref Range   Ferritin 17 (L) 22 - 322 ng/mL    Comment: Performed at [place]     Status: Abnormal   Collection Time: 12/28/14  9:28 PM  Result Value Ref Range   Retic Ct Pct 0.7  0.4 - 3.1 %   RBC. 2.96 (L) 4.22 - 5.81 MIL/uL   Retic Count, Manual 20.7 19.0 - 186.0 K/uL  Hemoglobin and hematocrit, blood     Status: Abnormal   Collection Time: 12/29/14  5:50 AM  Result Value Ref Range   Hemoglobin 5.8 (LL) 13.0 - 17.0 g/dL    Comment: REPEATED TO VERIFY CRITICAL VALUE NOTED.  VALUE IS CONSISTENT WITH PREVIOUSLY REPORTED AND CALLED VALUE.    HCT 19.6 (L) 39.0 - 31.5 %  Basic metabolic panel     Status: Abnormal   Collection Time: 12/29/14  5:50 AM  Result Value Ref Range   Sodium 139 135 - 145 mmol/L   Potassium 3.4 (L) 3.5 - 5.1 mmol/L   Chloride 103 96 - 112 mmol/L   CO2 32 19 - 32 mmol/L   Glucose, Bld 89 70 - 99 mg/dL   BUN 11 6 - 23 mg/dL   Creatinine, Ser 0.88 0.50 - 1.35 mg/dL   Calcium 8.4 8.4 - 10.5 mg/dL   GFR calc non Af Amer 75 (L) >90 mL/min   GFR calc Af Amer 87 (L) >90 mL/min    Comment: (NOTE) The eGFR has been calculated using the CKD EPI equation. This calculation has not been validated in all clinical situations. eGFR's persistently <90 mL/min signify possible Chronic Kidney Disease.    Anion gap 4 (L) 5 - 15  Prepare RBC     Status: None   Collection Time: 12/29/14  8:30 AM  Result Value Ref Range   Order Confirmation ORDER PROCESSED BY BLOOD BANK   CBC     Status: Abnormal   Collection Time: 12/30/14  5:55 AM  Result Value Ref Range   WBC 6.2 4.0 - 10.5 K/uL   RBC 3.94 (L) 4.22 - 5.81 MIL/uL   Hemoglobin 8.4 (L) 13.0 - 17.0 g/dL    Comment: REPEATED TO VERIFY POST TRANSFUSION SPECIMEN    HCT 27.3 (L) 39.0 - 52.0 %    MCV 69.3 (L) 78.0 - 100.0 fL    Comment: REPEATED TO VERIFY POST TRANSFUSION SPECIMEN    MCH 21.3 (L) 26.0 - 34.0 pg   MCHC 30.8 30.0 - 36.0 g/dL   RDW 24.7 (H) 11.5 - 15.5 %   Platelets 153 150 - 400 K/uL  Comprehensive metabolic panel     Status: Abnormal   Collection Time: 12/30/14  5:55 AM  Result Value Ref Range   Sodium 136 135 - 145 mmol/L   Potassium 3.7 3.5 - 5.1 mmol/L   Chloride 104 96 - 112 mmol/L   CO2 28 19 - 32 mmol/L   Glucose, Bld 82 70 - 99 mg/dL   BUN 11 6 - 23 mg/dL   Creatinine, Ser 1.06 0.50 - 1.35 mg/dL   Calcium 8.6 8.4 - 10.5 mg/dL   Total Protein 5.7 (L) 6.0 - 8.3 g/dL   Albumin 2.6 (L) 3.5 - 5.2 g/dL   AST 19 0 - 37 U/L   ALT 11 0 - 53 U/L   Alkaline Phosphatase 54 39 - 117 U/L   Total Bilirubin 1.0 0.3 - 1.2 mg/dL   GFR calc non Af Amer 61 (L) >90 mL/min   GFR calc Af Amer 71 (L) >90 mL/min    Comment: (NOTE) The eGFR has been calculated using the CKD EPI equation. This calculation has not been validated in all clinical situations. eGFR's persistently <90 mL/min signify possible Chronic Kidney Disease.    Anion gap 4 (L) 5 -  15     HPI :[name] [name] is a 81 y.o. male with prior h/o hypertension, colon mass, h/o rectal bleeding, CAD, dementia, was sent by his PCP for anemia, with a hemoglobin of 4. Most of the history was obtained from EDP, and family members. Pt has dementiaand is very hard of hearing. As per the family he has been feeling weak the last one week. But asking the patient he denies any pain or nausea, vomiting or abdominal pain. Unclear if he is having GI bleed vs bone marrow failure. At this time patient and family does not want any work up for the colon mass or any intervention . They want the patient to get blood transfusion and be discharged home. On further labs, he was also found have severe hypokalemia. His UA was negative for infection and his INR is 1.24. He was referred to medical service for admission for blood transfusions.  2 units of prbc were ordered and oral potassium supplementation was given.   HOSPITAL COURSE:  Microcytic anemia/acute on chronic blood loss anemia chronic blood loss from his colon mass.  Start the patient on iron supplementation  Status post 4 units of packed red blood cells hemoglobin 4.1> 8.4 Will need repeat CBC in one week Currently family and patient does not want any further work up other than blood transfusions.    Accelerated hypertension: Cont norvasc , hydralazine,Lasix   Mild leukopenia, improving normal platelets.    Hypokalemia:  Repleted  Failure to thrive: nutrition consult and physical therapy eval. As per the family, he ambulates using a cane and a walker.    Generalized weakness from a combination of hypokalemia and anemia. PT eval   CAD: Pt denies any chest pain or sob.   Dementia: continue Aricept.     Discharge Exam:  Blood pressure 119/66, pulse 81, temperature 98.3 F (36.8 C), temperature source Oral, resp. rate 20, weight 75.7 kg (166 lb 14.2 oz), SpO2 96 %.  General: No acute respiratory distress Lungs: Clear to auscultation bilaterally without wheezes or crackles Cardiovascular: Regular rate and rhythm without murmur gallop or rub normal S1 and S2 Abdomen: Nontender, nondistended, soft, bowel sounds positive, no rebound, no ascites, no appreciable mass Extremities: No significant cyanosis, clubbing, or edema bilateral lower extremities       Discharge Instructions    Diet - low sodium heart healthy    Complete by:  As directed      Increase activity slowly    Complete by:  As directed            Follow-up Information    Follow up with [facility] JR,GEORGE R, MD. Schedule an appointment as soon as possible for a visit in 1 week.   Specialty:  Pulmonary Disease   Contact information:   [place] [address] [phone]       Follow up with [facility].   Why:  Home Health Physical  Therapy   Contact information:   [place] [address] [phone]       Follow up with [facility] JR,GEORGE R, MD. Schedule an appointment as soon as possible for a visit in 3 days.   Specialty:  Pulmonary Disease   Contact information:   [place] [address] [phone]       Signed: Reyne Dumas 12/30/2014, 12:49 PM

## 2014-12-30 NOTE — Progress Notes (Signed)
Pt discharging via wheelchair, escorted by volunteer, accompanied by family. Discharge instructions provided to patient and family (daughter and wife), verbalize understanding and able to provide appropriate teach back. PIVs discontinued, sites without s/s of complication. Denies needs or questions at this time.

## 2014-12-30 NOTE — Progress Notes (Signed)
12/30/2014 1200 Received call from [facility], unable to accept referral. Contacted [facility] for [place]. Notified wife, Inez Catalina of new agency for [facility]. Jonnie Finner RN CCM Case Mgmt phone [phone]
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
## 2014-12-30 NOTE — Progress Notes (Signed)
CARE MANAGEMENT NOTE 12/30/2014  Patient:  [name] [name]   Account Number:  [id number]  Date Initiated:  12/30/2014  Documentation initiated by:  [facility]  Subjective/Objective Assessment:   Anemia     Action/Plan:   Anticipated DC Date:  12/30/2014   Anticipated DC Plan:  [place]  CM consult      [facility] Choice  HOME HEALTH   Choice offered to / List presented to:  C-3 Spouse        HH arranged  HH-2 PT      [place].   Status of service:  Completed, signed off Medicare Important Message given?  NO (If response is "NO", the following Medicare IM given date fields will be blank) Date Medicare IM given:   Medicare IM given by:   Date Additional Medicare IM given:   Additional Medicare IM given by:    Discharge Disposition:  [place]  Per UR Regulation:    If discussed at Long Length of Stay Meetings, dates discussed:    Comments:  12/30/2014 1100 NCM spoke to pt and gave permission to speak to wife, [name] [name]. NCM contacted wife, [name] [name]  # [phone]. Offered choice for [facility]. Wife requested [facility]. Pt had [facility] in the past. States he has RW, and cane at home. Contacted [facility] with new referral. Jonnie Finner RN CCM Case Mgmt phone [phone]

## 2015-01-04 DIAGNOSIS — I200 Unstable angina: Secondary | ICD-10-CM | POA: Diagnosis not present

## 2015-01-04 DIAGNOSIS — I10 Essential (primary) hypertension: Secondary | ICD-10-CM | POA: Diagnosis not present

## 2015-01-04 DIAGNOSIS — F0390 Unspecified dementia without behavioral disturbance: Secondary | ICD-10-CM | POA: Diagnosis not present

## 2015-01-04 DIAGNOSIS — I714 Abdominal aortic aneurysm, without rupture: Secondary | ICD-10-CM | POA: Diagnosis not present

## 2015-01-04 DIAGNOSIS — Z85038 Personal history of other malignant neoplasm of large intestine: Secondary | ICD-10-CM | POA: Diagnosis not present

## 2015-01-04 DIAGNOSIS — R531 Weakness: Secondary | ICD-10-CM | POA: Diagnosis not present

## 2015-01-04 DIAGNOSIS — R269 Unspecified abnormalities of gait and mobility: Secondary | ICD-10-CM | POA: Diagnosis not present

## 2015-01-04 DIAGNOSIS — D649 Anemia, unspecified: Secondary | ICD-10-CM | POA: Diagnosis not present

## 2015-01-05 DIAGNOSIS — I10 Essential (primary) hypertension: Secondary | ICD-10-CM | POA: Diagnosis not present

## 2015-01-05 DIAGNOSIS — I714 Abdominal aortic aneurysm, without rupture: Secondary | ICD-10-CM | POA: Diagnosis not present

## 2015-01-05 DIAGNOSIS — R269 Unspecified abnormalities of gait and mobility: Secondary | ICD-10-CM | POA: Diagnosis not present

## 2015-01-05 DIAGNOSIS — R531 Weakness: Secondary | ICD-10-CM | POA: Diagnosis not present

## 2015-01-05 DIAGNOSIS — Z85038 Personal history of other malignant neoplasm of large intestine: Secondary | ICD-10-CM | POA: Diagnosis not present

## 2015-01-05 DIAGNOSIS — D649 Anemia, unspecified: Secondary | ICD-10-CM | POA: Diagnosis not present

## 2015-01-05 DIAGNOSIS — F0390 Unspecified dementia without behavioral disturbance: Secondary | ICD-10-CM | POA: Diagnosis not present

## 2015-01-05 DIAGNOSIS — I200 Unstable angina: Secondary | ICD-10-CM | POA: Diagnosis not present

## 2015-01-07 DIAGNOSIS — D638 Anemia in other chronic diseases classified elsewhere: Secondary | ICD-10-CM | POA: Diagnosis not present

## 2015-01-07 DIAGNOSIS — F0390 Unspecified dementia without behavioral disturbance: Secondary | ICD-10-CM | POA: Diagnosis not present

## 2015-01-07 DIAGNOSIS — E780 Pure hypercholesterolemia: Secondary | ICD-10-CM | POA: Diagnosis not present

## 2015-01-07 DIAGNOSIS — F0150 Vascular dementia without behavioral disturbance: Secondary | ICD-10-CM | POA: Diagnosis not present

## 2015-01-07 DIAGNOSIS — Z79899 Other long term (current) drug therapy: Secondary | ICD-10-CM | POA: Diagnosis not present

## 2015-01-07 DIAGNOSIS — I714 Abdominal aortic aneurysm, without rupture: Secondary | ICD-10-CM | POA: Diagnosis not present

## 2015-01-07 DIAGNOSIS — Z79891 Long term (current) use of opiate analgesic: Secondary | ICD-10-CM | POA: Diagnosis not present

## 2015-01-07 DIAGNOSIS — I200 Unstable angina: Secondary | ICD-10-CM | POA: Diagnosis not present

## 2015-01-07 DIAGNOSIS — D649 Anemia, unspecified: Secondary | ICD-10-CM | POA: Diagnosis not present

## 2015-01-07 DIAGNOSIS — D500 Iron deficiency anemia secondary to blood loss (chronic): Secondary | ICD-10-CM | POA: Diagnosis not present

## 2015-01-07 DIAGNOSIS — R531 Weakness: Secondary | ICD-10-CM | POA: Diagnosis not present

## 2015-01-07 DIAGNOSIS — Z85038 Personal history of other malignant neoplasm of large intestine: Secondary | ICD-10-CM | POA: Diagnosis not present

## 2015-01-07 DIAGNOSIS — I10 Essential (primary) hypertension: Secondary | ICD-10-CM | POA: Diagnosis not present

## 2015-01-07 DIAGNOSIS — R269 Unspecified abnormalities of gait and mobility: Secondary | ICD-10-CM | POA: Diagnosis not present

## 2015-02-25 DIAGNOSIS — D509 Iron deficiency anemia, unspecified: Secondary | ICD-10-CM | POA: Diagnosis not present

## 2015-02-25 DIAGNOSIS — G933 Postviral fatigue syndrome: Secondary | ICD-10-CM | POA: Diagnosis not present

## 2015-02-25 DIAGNOSIS — I119 Hypertensive heart disease without heart failure: Secondary | ICD-10-CM | POA: Diagnosis not present

## 2015-02-25 DIAGNOSIS — Z79899 Other long term (current) drug therapy: Secondary | ICD-10-CM | POA: Diagnosis not present

## 2015-02-25 DIAGNOSIS — R630 Anorexia: Secondary | ICD-10-CM | POA: Diagnosis not present

## 2015-02-25 DIAGNOSIS — F0150 Vascular dementia without behavioral disturbance: Secondary | ICD-10-CM | POA: Diagnosis not present

## 2015-02-25 DIAGNOSIS — R1110 Vomiting, unspecified: Secondary | ICD-10-CM | POA: Diagnosis not present

## 2015-03-03 ENCOUNTER — Inpatient Hospital Stay (HOSPITAL_COMMUNITY)
Admission: EM | Admit: 2015-03-03 | Discharge: 2015-03-08 | DRG: 377 | Disposition: A | Discharge status: Home / Self Care | Payer: Medicare Other | Attending: Internal Medicine | Admitting: Internal Medicine

## 2015-03-03 ENCOUNTER — Encounter (HOSPITAL_COMMUNITY): Payer: Self-pay | Admitting: *Deleted

## 2015-03-03 DIAGNOSIS — Z952 Presence of prosthetic heart valve: Secondary | ICD-10-CM | POA: Diagnosis not present

## 2015-03-03 DIAGNOSIS — H9190 Unspecified hearing loss, unspecified ear: Secondary | ICD-10-CM | POA: Diagnosis not present

## 2015-03-03 DIAGNOSIS — I714 Abdominal aortic aneurysm, without rupture: Secondary | ICD-10-CM | POA: Diagnosis present

## 2015-03-03 DIAGNOSIS — D649 Anemia, unspecified: Secondary | ICD-10-CM

## 2015-03-03 DIAGNOSIS — G4730 Sleep apnea, unspecified: Secondary | ICD-10-CM | POA: Diagnosis not present

## 2015-03-03 DIAGNOSIS — I10 Essential (primary) hypertension: Secondary | ICD-10-CM | POA: Diagnosis present

## 2015-03-03 DIAGNOSIS — M1990 Unspecified osteoarthritis, unspecified site: Secondary | ICD-10-CM | POA: Diagnosis not present

## 2015-03-03 DIAGNOSIS — E43 Unspecified severe protein-calorie malnutrition: Secondary | ICD-10-CM | POA: Diagnosis not present

## 2015-03-03 DIAGNOSIS — K639 Disease of intestine, unspecified: Secondary | ICD-10-CM | POA: Diagnosis not present

## 2015-03-03 DIAGNOSIS — D500 Iron deficiency anemia secondary to blood loss (chronic): Secondary | ICD-10-CM | POA: Diagnosis not present

## 2015-03-03 DIAGNOSIS — Z66 Do not resuscitate: Secondary | ICD-10-CM | POA: Diagnosis not present

## 2015-03-03 DIAGNOSIS — Z951 Presence of aortocoronary bypass graft: Secondary | ICD-10-CM | POA: Diagnosis not present

## 2015-03-03 DIAGNOSIS — C189 Malignant neoplasm of colon, unspecified: Secondary | ICD-10-CM | POA: Diagnosis not present

## 2015-03-03 DIAGNOSIS — K2970 Gastritis, unspecified, without bleeding: Secondary | ICD-10-CM | POA: Diagnosis not present

## 2015-03-03 DIAGNOSIS — K219 Gastro-esophageal reflux disease without esophagitis: Secondary | ICD-10-CM | POA: Diagnosis present

## 2015-03-03 DIAGNOSIS — E785 Hyperlipidemia, unspecified: Secondary | ICD-10-CM | POA: Diagnosis not present

## 2015-03-03 DIAGNOSIS — M069 Rheumatoid arthritis, unspecified: Secondary | ICD-10-CM | POA: Diagnosis present

## 2015-03-03 DIAGNOSIS — E876 Hypokalemia: Secondary | ICD-10-CM | POA: Diagnosis not present

## 2015-03-03 DIAGNOSIS — Z79899 Other long term (current) drug therapy: Secondary | ICD-10-CM | POA: Diagnosis not present

## 2015-03-03 DIAGNOSIS — Z8249 Family history of ischemic heart disease and other diseases of the circulatory system: Secondary | ICD-10-CM | POA: Diagnosis not present

## 2015-03-03 DIAGNOSIS — I252 Old myocardial infarction: Secondary | ICD-10-CM | POA: Diagnosis not present

## 2015-03-03 DIAGNOSIS — K922 Gastrointestinal hemorrhage, unspecified: Secondary | ICD-10-CM | POA: Diagnosis not present

## 2015-03-03 DIAGNOSIS — F32A Depression, unspecified: Secondary | ICD-10-CM | POA: Diagnosis present

## 2015-03-03 DIAGNOSIS — R531 Weakness: Secondary | ICD-10-CM

## 2015-03-03 DIAGNOSIS — Z8546 Personal history of malignant neoplasm of prostate: Secondary | ICD-10-CM | POA: Diagnosis not present

## 2015-03-03 DIAGNOSIS — D62 Acute posthemorrhagic anemia: Secondary | ICD-10-CM | POA: Diagnosis present

## 2015-03-03 DIAGNOSIS — I2510 Atherosclerotic heart disease of native coronary artery without angina pectoris: Secondary | ICD-10-CM | POA: Diagnosis not present

## 2015-03-03 DIAGNOSIS — F329 Major depressive disorder, single episode, unspecified: Secondary | ICD-10-CM | POA: Diagnosis present

## 2015-03-03 DIAGNOSIS — Z515 Encounter for palliative care: Secondary | ICD-10-CM

## 2015-03-03 DIAGNOSIS — R627 Adult failure to thrive: Secondary | ICD-10-CM | POA: Diagnosis present

## 2015-03-03 DIAGNOSIS — K6389 Other specified diseases of intestine: Secondary | ICD-10-CM | POA: Diagnosis present

## 2015-03-03 DIAGNOSIS — F0390 Unspecified dementia without behavioral disturbance: Secondary | ICD-10-CM | POA: Diagnosis present

## 2015-03-03 DIAGNOSIS — D638 Anemia in other chronic diseases classified elsewhere: Secondary | ICD-10-CM | POA: Diagnosis not present

## 2015-03-03 LAB — COMPREHENSIVE METABOLIC PANEL
ALT: 11 U/L (ref 0–53)
ANION GAP: 9 (ref 5–15)
AST: 24 U/L (ref 0–37)
Albumin: 2.9 g/dL — ABNORMAL LOW (ref 3.5–5.2)
Alkaline Phosphatase: 61 U/L (ref 39–117)
BILIRUBIN TOTAL: 0.4 mg/dL (ref 0.3–1.2)
BUN: 12 mg/dL (ref 6–23)
CHLORIDE: 99 mmol/L (ref 96–112)
CO2: 31 mmol/L (ref 19–32)
CREATININE: 0.97 mg/dL (ref 0.50–1.35)
Calcium: 8.6 mg/dL (ref 8.4–10.5)
GFR, EST AFRICAN AMERICAN: 84 mL/min — AB (ref >90)
GFR, EST NON AFRICAN AMERICAN: 72 mL/min — AB (ref >90)
Glucose, Bld: 89 mg/dL (ref 70–99)
POTASSIUM: 2.9 mmol/L — AB (ref 3.5–5.1)
SODIUM: 139 mmol/L (ref 135–145)
TOTAL PROTEIN: 6.3 g/dL (ref 6.0–8.3)

## 2015-03-03 LAB — CBC WITH DIFFERENTIAL/PLATELET
Basophils Absolute: 0 10*3/uL (ref 0.0–0.1)
Basophils Relative: 0 % (ref 0–1)
EOS PCT: 2 % (ref 0–5)
Eosinophils Absolute: 0.1 10*3/uL (ref 0.0–0.7)
HCT: 19.5 % — ABNORMAL LOW (ref 39.0–52.0)
HEMOGLOBIN: 5.7 g/dL — AB (ref 13.0–17.0)
Lymphocytes Relative: 14 % (ref 12–46)
Lymphs Abs: 0.6 10*3/uL — ABNORMAL LOW (ref 0.7–4.0)
MCH: 21.5 pg — AB (ref 26.0–34.0)
MCHC: 29.2 g/dL — AB (ref 30.0–36.0)
MCV: 73.6 fL — AB (ref 78.0–100.0)
Monocytes Absolute: 0.6 10*3/uL (ref 0.1–1.0)
Monocytes Relative: 12 % (ref 3–12)
NEUTROS ABS: 3.3 10*3/uL (ref 1.7–7.7)
Neutrophils Relative %: 72 % (ref 43–77)
Platelets: 254 10*3/uL (ref 150–400)
RBC: 2.65 MIL/uL — AB (ref 4.22–5.81)
RDW: 22.8 % — ABNORMAL HIGH (ref 11.5–15.5)
WBC: 4.6 10*3/uL (ref 4.0–10.5)

## 2015-03-03 LAB — MRSA PCR SCREENING: MRSA by PCR: NEGATIVE

## 2015-03-03 LAB — PROTIME-INR
INR: 1.1 (ref 0.00–1.49)
Prothrombin Time: 14.4 seconds (ref 11.6–15.2)

## 2015-03-03 LAB — POC OCCULT BLOOD, ED: FECAL OCCULT BLD: POSITIVE — AB

## 2015-03-03 LAB — APTT: aPTT: 35 seconds (ref 24–37)

## 2015-03-03 LAB — PREPARE RBC (CROSSMATCH)

## 2015-03-03 MED ORDER — PANTOPRAZOLE SODIUM 40 MG IV SOLR: 40.0000 mg | Freq: Two times a day (BID) | INTRAVENOUS | Status: DC

## 2015-03-03 MED ORDER — VITAMIN D (ERGOCALCIFEROL) 1.25 MG (50000 UNIT) PO CAPS: 50000.0000 [IU] | ORAL_CAPSULE | ORAL | Status: DC

## 2015-03-03 MED ORDER — ACETAMINOPHEN 650 MG RE SUPP: 650.0000 mg | Freq: Four times a day (QID) | RECTAL | Status: DC | PRN

## 2015-03-03 MED ORDER — ACETAMINOPHEN 325 MG PO TABS: 650.0000 mg | ORAL_TABLET | Freq: Four times a day (QID) | ORAL | Status: DC | PRN

## 2015-03-03 MED ORDER — POTASSIUM CHLORIDE ER 10 MEQ PO TBCR
20.0000 meq | EXTENDED_RELEASE_TABLET | Freq: Every day | ORAL | Status: DC
  Administered 2015-03-03 – 2015-03-04 (×2): 20 meq via ORAL
  Filled 2015-03-03 (×4): qty 2

## 2015-03-03 MED ORDER — METOPROLOL TARTRATE 50 MG PO TABS
50.0000 mg | ORAL_TABLET | Freq: Every day | ORAL | Status: DC
  Administered 2015-03-04 – 2015-03-05 (×2): 50 mg via ORAL
  Filled 2015-03-03 (×3): qty 1
  Filled 2015-03-03: qty 2

## 2015-03-03 MED ORDER — SODIUM CHLORIDE 0.9 % IV SOLN
1000.0000 mL | INTRAVENOUS | Status: DC
Start: 2015-03-03 — End: 2015-03-03
  Administered 2015-03-03: 1000 mL via INTRAVENOUS

## 2015-03-03 MED ORDER — DONEPEZIL HCL 10 MG PO TABS
10.0000 mg | ORAL_TABLET | Freq: Every day | ORAL | Status: DC
  Administered 2015-03-03 – 2015-03-07 (×5): 10 mg via ORAL
  Filled 2015-03-03 (×10): qty 1

## 2015-03-03 MED ORDER — SODIUM CHLORIDE 0.9 % IV SOLN: Freq: Once | INTRAVENOUS | Status: DC

## 2015-03-03 MED ORDER — SODIUM CHLORIDE 0.9 % IV SOLN
8.0000 mg/h | INTRAVENOUS | Status: DC
  Administered 2015-03-03 (×2): 8 mg/h via INTRAVENOUS
  Filled 2015-03-03 (×4): qty 80

## 2015-03-03 MED ORDER — SERTRALINE HCL 100 MG PO TABS
200.0000 mg | ORAL_TABLET | Freq: Every day | ORAL | Status: DC
  Administered 2015-03-03 – 2015-03-08 (×5): 200 mg via ORAL
  Filled 2015-03-03 (×6): qty 2

## 2015-03-03 MED ORDER — HYDRALAZINE HCL 20 MG/ML IJ SOLN
10.0000 mg | INTRAMUSCULAR | Status: DC | PRN
  Administered 2015-03-03 – 2015-03-06 (×4): 10 mg via INTRAVENOUS
  Filled 2015-03-03 (×5): qty 1

## 2015-03-03 MED ORDER — HYDRALAZINE HCL 25 MG PO TABS
25.0000 mg | ORAL_TABLET | Freq: Three times a day (TID) | ORAL | Status: DC
  Administered 2015-03-03 – 2015-03-04 (×2): 25 mg via ORAL
  Filled 2015-03-03 (×2): qty 1

## 2015-03-03 MED ORDER — ALBUTEROL SULFATE (2.5 MG/3ML) 0.083% IN NEBU: 2.5000 mg | INHALATION_SOLUTION | RESPIRATORY_TRACT | Status: DC | PRN

## 2015-03-03 MED ORDER — POTASSIUM CHLORIDE IN NACL 40-0.9 MEQ/L-% IV SOLN
INTRAVENOUS | Status: AC
  Administered 2015-03-03: 50 mL/h via INTRAVENOUS
  Filled 2015-03-03 (×2): qty 1000

## 2015-03-03 MED ORDER — PANTOPRAZOLE SODIUM 40 MG PO TBEC
40.0000 mg | DELAYED_RELEASE_TABLET | Freq: Every day | ORAL | Status: DC
  Administered 2015-03-03: 40 mg via ORAL

## 2015-03-03 MED ORDER — SODIUM CHLORIDE 0.9 % IV SOLN
80.0000 mg | Freq: Once | INTRAVENOUS | Status: AC
  Administered 2015-03-03: 80 mg via INTRAVENOUS
  Filled 2015-03-03: qty 80

## 2015-03-03 MED ORDER — ATORVASTATIN CALCIUM 40 MG PO TABS
40.0000 mg | ORAL_TABLET | Freq: Every day | ORAL | Status: DC
  Administered 2015-03-03 – 2015-03-04 (×2): 40 mg via ORAL
  Filled 2015-03-03 (×5): qty 1

## 2015-03-03 NOTE — ED Provider Notes (Signed)
CSN: [id number]     Arrival date & time 03/03/15  7425 History   First MD Initiated Contact with Patient 03/03/15 [phone]     Chief Complaint  Patient presents with  . Abnormal Lab    HPI Pt was seen by Dr. Katherine Roan routinely on 4/21.  As part of that workup he had blood tests drawn.  Pt was called by the office yesterday stating that his blood count was 6.1 and told him to come to the ED.  Family decided to come this morning.  Pt has history of rectal bleeding but has not told his family anything recently.  Pt does have history of colon and prostate CA.   Pt has seeds implanted for the prostate CA.  Pt has not had any surgery for the colon ca.  He has refused.  Pt denies any bleeding.  He denies any pain or other complaints.  Similar episode back in February.  Pt was admitted for transfusion but did not want any other workup. Past Medical History  Diagnosis Date  . Hypertension   . Coronary artery disease   . MI (myocardial infarction)   . AAA (abdominal aortic aneurysm)   . Dementia   . Sleep apnea   . GERD (gastroesophageal reflux disease)   . Arthritis   . RA (rheumatoid arthritis)   . Cancer     PROSTATE CA  . Anemia 12/28/2014  . History of rectal bleeding    Past Surgical History  Procedure Laterality Date  . Colonoscopy N/A 01/11/2013    Procedure: COLONOSCOPY;  Surgeon: Missy Sabins, MD;  Location: [facility];  Service: Endoscopy;  Laterality: N/A;  . Aorta doppler  06/26/2013    Fusiform aneurysm w/ current measurements of 3.8x4cm  . Cardiac catheterization  11/23/2008    Recommendation-CABG and AVR  . Aortic valve replacement (avr)/coronary artery bypass grafting (cabg)  11/30/2008    AVR-74mm pericardial Magna tissue valve, model 3000, serial [id number]. CABG-x3. SVG to PDA, SVG to ramus, and SVG to distal LAD  . Cardiovascular stress test  08/02/2011    Perfusion defect seen in the inferobasal region consistant w/ diaphragmatic attenuation, pharmacological stress test w/o CP  or EKG changes for ischemia.  . Transthoracic echocardiogram  01/06/2013    EF 55-60%, LA severely dilated,   . Coronary artery bypass graft     Family History  Problem Relation Age of Onset  . Hypertension Brother    History  Substance Use Topics  . Smoking status: Never Smoker   . Smokeless tobacco: Never Used  . Alcohol Use: No    Review of Systems  All other systems reviewed and are negative.     Allergies  Review of patient's allergies indicates no known allergies.  Home Medications   Prior to Admission medications   Medication Sig Start Date End Date Taking? Authorizing Provider  amLODipine (NORVASC) 10 MG tablet Take 10 mg by mouth daily.    Historical Provider, MD  calcium carbonate (OS-CAL) 600 MG TABS tablet Take 600 mg by mouth daily with breakfast.    Historical Provider, MD  donepezil (ARICEPT) 10 MG tablet Take 10 mg by mouth at bedtime.    Historical Provider, MD  ferrous sulfate 325 (65 FE) MG tablet Take 1 tablet (325 mg total) by mouth 2 (two) times daily with a meal. 06/06/14   Thurnell Lose, MD  furosemide (LASIX) 20 MG tablet Take 20 mg by mouth daily.    Historical Provider, MD  hydrALAZINE (  APRESOLINE) 25 MG tablet Take 1 tablet (25 mg total) by mouth every 8 (eight) hours. 06/06/14   Thurnell Lose, MD  iron polysaccharides (NU-IRON) 150 MG capsule Take 1 capsule (150 mg total) by mouth 2 (two) times daily. 12/30/14   Reyne Dumas, MD  metoprolol (LOPRESSOR) 50 MG tablet Take 50 mg by mouth daily.     Historical Provider, MD  pantoprazole (PROTONIX) 40 MG tablet Take 1 tablet (40 mg total) by mouth daily. Switch for any other PPI at similar dose and frequency 04/15/14   Debbe Odea, MD  potassium chloride (K-DUR) 10 MEQ tablet Take 2 tablets (20 mEq total) by mouth daily. Patient taking differently: Take 20 mEq by mouth at bedtime.  04/15/14   Debbe Odea, MD  sertraline (ZOLOFT) 100 MG tablet Take 200 mg by mouth daily.    Historical Provider, MD   simvastatin (ZOCOR) 80 MG tablet Take 40 mg by mouth at bedtime.    Historical Provider, MD  Vitamin D, Ergocalciferol, (DRISDOL) 50000 UNITS CAPS capsule Take 50,000 Units by mouth every 7 (seven) days. Takes on Sunday    Historical Provider, MD   BP 129/76 mmHg  Pulse 87  Temp(Src) 98.1 F (36.7 C) (Oral)  Resp 16  SpO2 95% Physical Exam  Constitutional: No distress.  HENT:  Head: Normocephalic and atraumatic.  Right Ear: External ear normal.  Left Ear: External ear normal.  Eyes: Conjunctivae are normal. Right eye exhibits no discharge. Left eye exhibits no discharge. No scleral icterus.  Neck: Neck supple. No tracheal deviation present.  Cardiovascular: Normal rate, regular rhythm and intact distal pulses.   Pulmonary/Chest: Effort normal and breath sounds normal. No stridor. No respiratory distress. He has no wheezes. He has no rales.  Abdominal: Soft. Bowel sounds are normal. He exhibits no distension. There is no tenderness. There is no rebound and no guarding.  Musculoskeletal: He exhibits edema (bilateral lower extrem). He exhibits no tenderness.  Neurological: He is alert. No cranial nerve deficit (no facial droop, extraocular movements intact, no slurred speech) or sensory deficit. He exhibits normal muscle tone. He displays no seizure activity. Coordination normal.  Skin: Skin is warm and dry. No rash noted.  Psychiatric: He has a normal mood and affect.  Nursing note and vitals reviewed.   ED Course  Procedures (including critical care time)  CRITICAL CARE Performed by: GTXMI,WOE Total critical care time: 35 Critical care time was exclusive of separately billable procedures and treating other patients. Critical care was necessary to treat or prevent imminent or life-threatening deterioration. Critical care was time spent personally by me on the following activities: development of treatment plan with patient and/or surrogate as well as nursing, discussions with  consultants, evaluation of patient's response to treatment, examination of patient, obtaining history from patient or surrogate, ordering and performing treatments and interventions, ordering and review of laboratory studies, ordering and review of radiographic studies, pulse oximetry and re-evaluation of patient's condition.  Labs Review Labs Reviewed  CBC WITH DIFFERENTIAL/PLATELET - Abnormal; Notable for the following:    RBC 2.65 (*)    Hemoglobin 5.7 (*)    HCT 19.5 (*)    MCV 73.6 (*)    MCH 21.5 (*)    MCHC 29.2 (*)    RDW 22.8 (*)    Lymphs Abs 0.6 (*)    All other components within normal limits  COMPREHENSIVE METABOLIC PANEL - Abnormal; Notable for the following:    Potassium 2.9 (*)    Albumin 2.9 (*)  GFR calc non Af Amer 72 (*)    GFR calc Af Amer 84 (*)    All other components within normal limits  POC OCCULT BLOOD, ED - Abnormal; Notable for the following:    Fecal Occult Bld POSITIVE (*)    All other components within normal limits  APTT  PROTIME-INR  TYPE AND SCREEN  PREPARE RBC (CROSSMATCH)    Medications  0.9 %  sodium chloride infusion (1,000 mLs Intravenous New Bag/Given 03/03/15 0819)  0.9 %  sodium chloride infusion (not administered)  PRBC ordered 0850   MDM   Final diagnoses:  Gastrointestinal hemorrhage, unspecified gastritis, unspecified gastrointestinal hemorrhage type  Colon cancer  Acute blood loss anemia    I reviewed the patient's old records. There are imaging tests back in 2014 describing thickening and a portion of the colon that correlated with an abnormal colonoscopy. A biopsy was consistent with a low-grade carcinoma. Patient declined surgery at that time. The patient has had intermittent problems with rectal bleeding requiring blood transfusions. He presents today with recurrent symptoms. His hemoglobin is 5.7 here in the emergency department although at this time as blood pressure stable. I have ordered transfusion for PRBC.  Plan to  admit him to the hospital for blood transfusions and monitoring. The patient's family still confirms that they do not want any other aggressive interventions.  Dorie Rank, MD 03/03/15 [phone]

## 2015-03-03 NOTE — Consult Note (Signed)
Referring Provider: Dr. Waldron Labs Primary Care Physician:  Leola Brazil, MD Primary Gastroenterologist:  Dr. Michail Sermon  Reason for Consultation:  Anemia  HPI: [name] [name] is a 81 y.o. male with a history of a colon mass noted several years ago that was concerning for cancer with biopsies showing ulcerated low grade dysplasia. He has refused surgery in the past and continues to refuse surgery for this colon mass. He was in the hospital in February for severe anemia and was transfused 4 Units of PRBCs but refused further workup. He comes in today with weakness and poor appetite and Hgb of 6.1 at his doctor's office (02/25/15). Hgb 5.7 here. Wife does not know if any blood per rectum was seen and patient is unable to give me any history. History of prostate cancer. No history of rectal bleeding but patient is very lethargic and hard of hearing and is not able to tell me about his stools. No N/V. Wife not able to give me any additional history. No BM since admit. Patient is on iron pills.   Past Medical History  Diagnosis Date  . Hypertension   . Coronary artery disease   . MI (myocardial infarction)   . AAA (abdominal aortic aneurysm)   . Dementia   . Sleep apnea   . GERD (gastroesophageal reflux disease)   . Arthritis   . RA (rheumatoid arthritis)   . Cancer     PROSTATE CA  . Anemia 12/28/2014  . History of rectal bleeding     Past Surgical History  Procedure Laterality Date  . Colonoscopy N/A 01/11/2013    Procedure: COLONOSCOPY;  Surgeon: Missy Sabins, MD;  Location: [facility];  Service: Endoscopy;  Laterality: N/A;  . Aorta doppler  06/26/2013    Fusiform aneurysm w/ current measurements of 3.8x4cm  . Cardiac catheterization  11/23/2008    Recommendation-CABG and AVR  . Aortic valve replacement (avr)/coronary artery bypass grafting (cabg)  11/30/2008    AVR-82mm pericardial Magna tissue valve, model 3000, serial [id number]. CABG-x3. SVG to PDA, SVG to ramus, and SVG to  distal LAD  . Cardiovascular stress test  08/02/2011    Perfusion defect seen in the inferobasal region consistant w/ diaphragmatic attenuation, pharmacological stress test w/o CP or EKG changes for ischemia.  . Transthoracic echocardiogram  01/06/2013    EF 55-60%, LA severely dilated,   . Coronary artery bypass graft      Prior to Admission medications   Medication Sig Start Date End Date Taking? Authorizing Provider  amLODipine (NORVASC) 10 MG tablet Take 10 mg by mouth daily.   Yes Historical Provider, MD  calcium carbonate (OS-CAL) 600 MG TABS tablet Take 600 mg by mouth daily with breakfast.   Yes Historical Provider, MD  donepezil (ARICEPT) 10 MG tablet Take 10 mg by mouth at bedtime.   Yes Historical Provider, MD  ferrous sulfate 325 (65 FE) MG tablet Take 1 tablet (325 mg total) by mouth 2 (two) times daily with a meal. 06/06/14  Yes Thurnell Lose, MD  furosemide (LASIX) 20 MG tablet Take 20 mg by mouth daily.   Yes Historical Provider, MD  hydrALAZINE (APRESOLINE) 25 MG tablet Take 1 tablet (25 mg total) by mouth every 8 (eight) hours. 06/06/14  Yes Thurnell Lose, MD  iron polysaccharides (NU-IRON) 150 MG capsule Take 1 capsule (150 mg total) by mouth 2 (two) times daily. 12/30/14  Yes Reyne Dumas, MD  metoprolol (LOPRESSOR) 50 MG tablet Take 50 mg by mouth daily.  Yes Historical Provider, MD  pantoprazole (PROTONIX) 40 MG tablet Take 1 tablet (40 mg total) by mouth daily. Switch for any other PPI at similar dose and frequency 04/15/14  Yes Debbe Odea, MD  sertraline (ZOLOFT) 100 MG tablet Take 200 mg by mouth daily.   Yes Historical Provider, MD  simvastatin (ZOCOR) 80 MG tablet Take 40 mg by mouth at bedtime.   Yes Historical Provider, MD  potassium chloride (K-DUR) 10 MEQ tablet Take 2 tablets (20 mEq total) by mouth daily. Patient not taking: Reported on 03/03/2015 04/15/14   Debbe Odea, MD  Vitamin D, Ergocalciferol, (DRISDOL) 50000 UNITS CAPS capsule Take 50,000 Units by  mouth every 7 (seven) days. Takes on Sunday    Historical Provider, MD    Scheduled Meds: . sodium chloride   Intravenous Once  . atorvastatin  40 mg Oral q1800  . donepezil  10 mg Oral QHS  . [START ON 03/06/2015] pantoprazole (PROTONIX) IV  40 mg Intravenous Q12H  . potassium chloride  20 mEq Oral Daily  . sertraline  200 mg Oral Daily  . [START ON 03/06/2015] Vitamin D (Ergocalciferol)  50,000 Units Oral Q7 days   Continuous Infusions: . 0.9 % NaCl with KCl 40 mEq / L 50 mL/hr (03/03/15 1006)  . pantoprozole (PROTONIX) infusion 8 mg/hr (03/03/15 1131)   PRN Meds:.acetaminophen **OR** acetaminophen, albuterol, hydrALAZINE  Allergies as of 03/03/2015  . (No Known Allergies)    Family History  Problem Relation Age of Onset  . Hypertension Brother     History   Social History  . Marital Status: Married    Spouse Name: N/A  . Number of Children: N/A  . Years of Education: N/A   Occupational History  . Not on file.   Social History Main Topics  . Smoking status: Never Smoker   . Smokeless tobacco: Never Used  . Alcohol Use: No  . Drug Use: No  . Sexual Activity: No   Other Topics Concern  . Not on file   Social History Narrative    Review of Systems: Unable to obtain from patient  Physical Exam: Vital signs: Filed Vitals:   03/03/15 1542  BP: 182/81  Pulse: 80  Temp: 98 F (36.7 C)  Resp: 18     General:   Elderly, lethargic, hard of hearing, frail HEENT: anicteric sclera Lungs:  Clear throughout to auscultation.   No wheezes, crackles, or rhonchi. No acute distress. Heart:  Regular rate and rhythm; no murmurs, clicks, rubs,  or gallops. Abdomen: soft, nontender, nondistended, +BS  Rectal:  Deferred Ext: 1+ pitting LE edema Skin: dry  GI:  Lab Results:  Recent Labs  03/03/15 0800  WBC 4.6  HGB 5.7*  HCT 19.5*  PLT 254   BMET  Recent Labs  03/03/15 0800  NA 139  K 2.9*  CL 99  CO2 31  GLUCOSE 89  BUN 12  CREATININE 0.97   CALCIUM 8.6   LFT  Recent Labs  03/03/15 0800  PROT 6.3  ALBUMIN 2.9*  AST 24  ALT 11  ALKPHOS 61  BILITOT 0.4   PT/INR  Recent Labs  03/03/15 0800  LABPROT 14.4  INR 1.10     Studies/Results: No results found.  Impression/Plan: 82 yo s/p GI bleed likely from known transverse colon mass (likely cancer) but patient and wife have refused treatment and surgery for this mass. Doubt a peptic ulcer source. Unclear to me whether he would want an EGD if necessary (ambiguous when  I discuss it with him as a possibility) but at this time would recommend blood transfusion and supportive care. Poor prognosis and with colon mass without treatment would recommend palliative care consult. Will follow.    LOS: 0 days   [place] C.  03/03/2015, 5:13 PM

## 2015-03-03 NOTE — H&P (Signed)
Patient Demographics  [name] [name], is a 81 y.o. male  MRN: [id number]   DOB - 04/23/1925  Admit Date - 03/03/2015  Outpatient Primary MD for the patient is Leola Brazil, MD   With History of -  Past Medical History  Diagnosis Date  . Hypertension   . Coronary artery disease   . MI (myocardial infarction)   . AAA (abdominal aortic aneurysm)   . Dementia   . Sleep apnea   . GERD (gastroesophageal reflux disease)   . Arthritis   . RA (rheumatoid arthritis)   . Cancer     PROSTATE CA  . Anemia 12/28/2014  . History of rectal bleeding       Past Surgical History  Procedure Laterality Date  . Colonoscopy N/A 01/11/2013    Procedure: COLONOSCOPY;  Surgeon: Missy Sabins, MD;  Location: [facility];  Service: Endoscopy;  Laterality: N/A;  . Aorta doppler  06/26/2013    Fusiform aneurysm w/ current measurements of 3.8x4cm  . Cardiac catheterization  11/23/2008    Recommendation-CABG and AVR  . Aortic valve replacement (avr)/coronary artery bypass grafting (cabg)  11/30/2008    AVR-73mm pericardial Magna tissue valve, model 3000, serial [id number]. CABG-x3. SVG to PDA, SVG to ramus, and SVG to distal LAD  . Cardiovascular stress test  08/02/2011    Perfusion defect seen in the inferobasal region consistant w/ diaphragmatic attenuation, pharmacological stress test w/o CP or EKG changes for ischemia.  . Transthoracic echocardiogram  01/06/2013    EF 55-60%, LA severely dilated,   . Coronary artery bypass graft      in for   Chief Complaint  Patient presents with  . Abnormal Lab     HPI  [name] [name]  is a 81 y.o. male, with history of hypertension, colon mass (family refused further workup in the past )history of rectal bleeding, CAD, dementia, patient was sent by his PCP office for hemoglobin of 6.1 as part of routine labs done as an outpatient on 4/21, family report the patient this a.m. to the hospital, patient had similar recent hospitalization for anemia, he  required 4 units packed blood cell transfusion, then family declined any further workup, he was discharged home, patient is a very poor historian and is hard of hearing, most of the history obtained from the wife, wife reports she did not notice any blood in the stool, he reports he had some occasional vomiting, does not recall the color, reports she has been using a lot of Alka-Seltzer recently, denies any chest pain, shortness of breath, cough, fever or chills. Patient is very poor historian, can't give any reliable history, patient was noticed to have tarry stool on rectal exam by ED, repeat hemoglobin was 5.7, patient was typed and crossed for 2 units.    Review of Systems    Very poor historian, has dementia, very hard of hearing,was unable to obtain review of system form patient   Social History History  Substance Use Topics  . Smoking status: Never Smoker   . Smokeless tobacco: Never Used  . Alcohol Use: No     Family History Family History  Problem Relation Age of Onset  . Hypertension Brother      Prior to Admission medications   Medication Sig Start Date End Date Taking? Authorizing Provider  amLODipine (NORVASC) 10 MG tablet Take 10 mg by mouth daily.    Historical Provider, MD  calcium carbonate (OS-CAL) 600 MG TABS tablet Take 600 mg  by mouth daily with breakfast.    Historical Provider, MD  donepezil (ARICEPT) 10 MG tablet Take 10 mg by mouth at bedtime.    Historical Provider, MD  ferrous sulfate 325 (65 FE) MG tablet Take 1 tablet (325 mg total) by mouth 2 (two) times daily with a meal. 06/06/14   Thurnell Lose, MD  furosemide (LASIX) 20 MG tablet Take 20 mg by mouth daily.    Historical Provider, MD  hydrALAZINE (APRESOLINE) 25 MG tablet Take 1 tablet (25 mg total) by mouth every 8 (eight) hours. 06/06/14   Thurnell Lose, MD  iron polysaccharides (NU-IRON) 150 MG capsule Take 1 capsule (150 mg total) by mouth 2 (two) times daily. 12/30/14   Reyne Dumas, MD    metoprolol (LOPRESSOR) 50 MG tablet Take 50 mg by mouth daily.     Historical Provider, MD  pantoprazole (PROTONIX) 40 MG tablet Take 1 tablet (40 mg total) by mouth daily. Switch for any other PPI at similar dose and frequency 04/15/14   Debbe Odea, MD  potassium chloride (K-DUR) 10 MEQ tablet Take 2 tablets (20 mEq total) by mouth daily. Patient taking differently: Take 20 mEq by mouth at bedtime.  04/15/14   Debbe Odea, MD  sertraline (ZOLOFT) 100 MG tablet Take 200 mg by mouth daily.    Historical Provider, MD  simvastatin (ZOCOR) 80 MG tablet Take 40 mg by mouth at bedtime.    Historical Provider, MD  Vitamin D, Ergocalciferol, (DRISDOL) 50000 UNITS CAPS capsule Take 50,000 Units by mouth every 7 (seven) days. Takes on Sunday    Historical Provider, MD    No Known Allergies  Physical Exam  Vitals  Blood pressure 157/72, pulse 74, temperature 98.1 F (36.7 C), temperature source Oral, resp. rate 17, SpO2 100 %.   1. General frail, ill-appearing elderly male lying in bed in NAD,    2. Normal affect , Awake ,  3. No F.N deficits, ALL C.Nerves Intact, moves all extremities without significant deficits..  4. Ears and Eyes appear Normal, Conjunctivae clear, PERRLA. Moist Oral Mucosa.  5. Supple Neck, No JVD, No cervical lymphadenopathy appriciated, No Carotid Bruits.  6. Symmetrical Chest wall movement, Good air movement bilaterally, CTAB.  7. RRR, No Gallops, Rubs or Murmurs, No Parasternal Heave.  8. Positive Bowel Sounds, scaphoid,  Abdomen Soft, No tenderness, No organomegaly appriciated,No rebound -guarding or rigidity.  9.  No Cyanosis, Normal Skin Turgor, No Skin Rash or Bruise.  10.   joints appear normal , no effusions, Normal ROM.     Data Review  CBC  Recent Labs Lab 03/03/15 0800  WBC 4.6  HGB 5.7*  HCT 19.5*  PLT 254  MCV 73.6*  MCH 21.5*  MCHC 29.2*  RDW 22.8*  LYMPHSABS 0.6*  MONOABS 0.6  EOSABS 0.1  BASOSABS 0.0    ------------------------------------------------------------------------------------------------------------------  Chemistries   Recent Labs Lab 03/03/15 0800  NA 139  K 2.9*  CL 99  CO2 31  GLUCOSE 89  BUN 12  CREATININE 0.97  CALCIUM 8.6  AST 24  ALT 11  ALKPHOS 61  BILITOT 0.4   ------------------------------------------------------------------------------------------------------------------ CrCl cannot be calculated (Unknown ideal weight.). ------------------------------------------------------------------------------------------------------------------ No results for input(s): TSH, T4TOTAL, T3FREE, THYROIDAB in the last 72 hours.  Invalid input(s): FREET3   Coagulation profile  Recent Labs Lab 03/03/15 0800  INR 1.10   ------------------------------------------------------------------------------------------------------------------- No results for input(s): DDIMER in the last 72 hours. -------------------------------------------------------------------------------------------------------------------  Cardiac Enzymes No results for input(s): CKMB, TROPONINI, MYOGLOBIN in  the last 168 hours.  Invalid input(s): CK ------------------------------------------------------------------------------------------------------------------ Invalid input(s): POCBNP   ---------------------------------------------------------------------------------------------------------------  Urinalysis    Component Value Date/Time   COLORURINE YELLOW 12/28/2014 1340   APPEARANCEUR CLOUDY* 12/28/2014 1340   LABSPEC 1.006 12/28/2014 1340   PHURINE 7.5 12/28/2014 1340   GLUCOSEU NEGATIVE 12/28/2014 1340   HGBUR NEGATIVE 12/28/2014 1340   BILIRUBINUR NEGATIVE 12/28/2014 1340   KETONESUR NEGATIVE 12/28/2014 1340   PROTEINUR NEGATIVE 12/28/2014 1340   UROBILINOGEN 0.2 12/28/2014 1340   NITRITE NEGATIVE 12/28/2014 1340   LEUKOCYTESUR NEGATIVE 12/28/2014 1340     ----------------------------------------------------------------------------------------------------------------  Imaging results:   No results found.      Assessment & Plan  Principal Problem:   GI bleed Active Problems:   Anemia   Hypertension   Protein-calorie malnutrition, severe   Microcytic anemia secondary to blood loss from GI bleed. - More likely chronic blood loss from his colon mass versus upper GI bleed from gastritis or gastric ulcer as he was taking Alka-Seltzer recently. - will transfuse 2 units packed red blood cells. - We'll keep nothing by mouth, discussed with GI Dr. Michail Sermon who will see the patient. - We'll start on Protonix drip.  Hypertension -Hold home medication until he is more stable.  Hyperlipidemia -Continue with statin  Hypokalemia - Will replace, will recheck in a.m.  Protein calorie malnutrition/failure to thrive - Start on nutrition supplement  Dementia - Continue with Aricept  DVT Prophylaxis SCDs (given his GI bleed)  AM Labs Ordered, also please review Full Orders  Family Communication: Admission, patients condition and plan of care including tests being ordered have been discussed with the patient and wife who indicate understanding and agree with the plan and Code Status.  Code Status DO NOT RESUSCITATE  Likely DC to  pending further workup  Condition GUARDED    Time spent in minutes : 55 minutes    Corena Tilson M.D on 03/03/2015 at 9:41 AM  Between 7am to 7pm - Pager - [phone]  After 7pm go to [email or web address] - password [email or web address]  And look for the night coverage person covering me after hours  [facility] Office  [phone]   **Disclaimer: This note may have been dictated with voice recognition software. Similar sounding words can inadvertently be transcribed and this note may contain transcription errors which may not have been corrected upon publication of note.**

## 2015-03-03 NOTE — ED Notes (Signed)
Per pt's family - pt referred to ED by Dr. Katherine Roan for hgb of 6.1 on 02/25/15 - pt's only complaint at this time is generalized weakness - denies any pain.

## 2015-03-04 DIAGNOSIS — D638 Anemia in other chronic diseases classified elsewhere: Secondary | ICD-10-CM | POA: Diagnosis present

## 2015-03-04 DIAGNOSIS — K6389 Other specified diseases of intestine: Secondary | ICD-10-CM | POA: Diagnosis present

## 2015-03-04 DIAGNOSIS — K922 Gastrointestinal hemorrhage, unspecified: Secondary | ICD-10-CM | POA: Diagnosis present

## 2015-03-04 DIAGNOSIS — Z515 Encounter for palliative care: Secondary | ICD-10-CM

## 2015-03-04 DIAGNOSIS — I10 Essential (primary) hypertension: Secondary | ICD-10-CM

## 2015-03-04 DIAGNOSIS — F329 Major depressive disorder, single episode, unspecified: Secondary | ICD-10-CM | POA: Diagnosis present

## 2015-03-04 DIAGNOSIS — F32A Depression, unspecified: Secondary | ICD-10-CM | POA: Diagnosis present

## 2015-03-04 DIAGNOSIS — E876 Hypokalemia: Secondary | ICD-10-CM | POA: Diagnosis present

## 2015-03-04 LAB — CBC
HCT: 25.4 % — ABNORMAL LOW (ref 39.0–52.0)
Hemoglobin: 7.6 g/dL — ABNORMAL LOW (ref 13.0–17.0)
MCH: 23.2 pg — ABNORMAL LOW (ref 26.0–34.0)
MCHC: 29.9 g/dL — ABNORMAL LOW (ref 30.0–36.0)
MCV: 77.4 fL — ABNORMAL LOW (ref 78.0–100.0)
Platelets: 203 10*3/uL (ref 150–400)
RBC: 3.28 MIL/uL — ABNORMAL LOW (ref 4.22–5.81)
RDW: 21.9 % — AB (ref 11.5–15.5)
WBC: 5.1 10*3/uL (ref 4.0–10.5)

## 2015-03-04 LAB — BASIC METABOLIC PANEL
ANION GAP: 2 — AB (ref 5–15)
BUN: 10 mg/dL (ref 6–23)
CO2: 29 mmol/L (ref 19–32)
Calcium: 8.2 mg/dL — ABNORMAL LOW (ref 8.4–10.5)
Chloride: 106 mmol/L (ref 96–112)
Creatinine, Ser: 0.92 mg/dL (ref 0.50–1.35)
GFR calc Af Amer: 85 mL/min — ABNORMAL LOW (ref >90)
GFR calc non Af Amer: 74 mL/min — ABNORMAL LOW (ref >90)
GLUCOSE: 80 mg/dL (ref 70–99)
Potassium: 3.4 mmol/L — ABNORMAL LOW (ref 3.5–5.1)
SODIUM: 137 mmol/L (ref 135–145)

## 2015-03-04 LAB — TYPE AND SCREEN
ABO/RH(D): A POS
ANTIBODY SCREEN: NEGATIVE
Unit division: 0
Unit division: 0

## 2015-03-04 LAB — MRSA PCR SCREENING: MRSA BY PCR: NEGATIVE

## 2015-03-04 MED ORDER — ENSURE ENLIVE PO LIQD
237.0000 mL | Freq: Two times a day (BID) | ORAL | Status: DC
  Administered 2015-03-04 – 2015-03-08 (×5): 237 mL via ORAL

## 2015-03-04 MED ORDER — POTASSIUM CHLORIDE IN NACL 40-0.9 MEQ/L-% IV SOLN
INTRAVENOUS | Status: DC
  Administered 2015-03-05: 50 mL/h via INTRAVENOUS
  Filled 2015-03-04: qty 1000

## 2015-03-04 MED ORDER — PANTOPRAZOLE SODIUM 40 MG IV SOLR
40.0000 mg | Freq: Two times a day (BID) | INTRAVENOUS | Status: DC
Start: 2015-03-04 — End: 2015-03-07
  Administered 2015-03-04 – 2015-03-06 (×5): 40 mg via INTRAVENOUS
  Filled 2015-03-04 (×10): qty 40

## 2015-03-04 MED ORDER — HYDRALAZINE HCL 50 MG PO TABS
50.0000 mg | ORAL_TABLET | Freq: Three times a day (TID) | ORAL | Status: DC
  Administered 2015-03-04 – 2015-03-07 (×8): 50 mg via ORAL
  Filled 2015-03-04 (×14): qty 1

## 2015-03-04 NOTE — Progress Notes (Signed)
Pt arrived to floor from ICU unit. Pt is stable with no complaints at this time. Pt is not for telemetry. Will continue to monitor pt.  [name] [name] [facility] 03/04/2015

## 2015-03-04 NOTE — Care Management Note (Signed)
  Page 1 of 1   03/04/2015     10:32:31 AM CARE MANAGEMENT NOTE 03/04/2015  Patient:  [facility]   Account Number:  [id number]  Date Initiated:  03/04/2015  Documentation initiated by:  Stormee Duda  Subjective/Objective Assessment:   gi bleeding with svt and hgb 5.7 requring bld transfusions     Action/Plan:   home when stable   Anticipated DC Date:  03/07/2015   Anticipated DC Plan:  HOME/SELF CARE  In-house referral  NA      DC Planning Services  CM consult      PAC Choice  NA   Choice offered to / List presented to:  NA           [place] agency  NA  NA   Status of service:  In process, will continue to follow Medicare Important Message given?   (If response is "NO", the following Medicare IM given date fields will be blank) Date Medicare IM given:   Medicare IM given by:   Date Additional Medicare IM given:   Additional Medicare IM given by:    Discharge Disposition:    Per UR Regulation:  Reviewed for med. necessity/level of care/duration of stay  If discussed at [place] of Stay Meetings, dates discussed:    Comments:  March 04, 2015/[place]blood L. Rosana Hoes, RN, BSN, CCM. Case Management [place] [phone] No discharge needs present of time of review.

## 2015-03-04 NOTE — Progress Notes (Signed)
Patient ID: [name] [name], male   DOB: 12/15/1924, 81 y.o.   MRN: [id number] [facility] Gastroenterology Progress Note  [name] [name] 81 y.o. 1925-05-15   Subjective: Sleeping but easily arousable and more alert after arousal. No BMs or rectal bleeding overnight. Hgb 7.6 (s/p 2 U PRBCs).  Objective: Vital signs: Filed Vitals:   03/04/15 0400  BP: 165/76  Pulse: 79  Temp: 98.5  Resp: 10    Physical Exam: Gen: lethargic, hard of hearing, elderly, frail, no acute distress  CV: RRR Chest: CTA anteriorly Abd: soft, nontender, nondistended, +BS  Lab Results:  Recent Labs  03/03/15 0800 03/04/15 0010  NA 139 137  K 2.9* 3.4*  CL 99 106  CO2 31 29  GLUCOSE 89 80  BUN 12 10  CREATININE 0.97 0.92  CALCIUM 8.6 8.2*    Recent Labs  03/03/15 0800  AST 24  ALT 11  ALKPHOS 61  BILITOT 0.4  PROT 6.3  ALBUMIN 2.9*    Recent Labs  03/03/15 0800 03/04/15 0010  WBC 4.6 5.1  NEUTROABS 3.3  --   HGB 5.7* 7.6*  HCT 19.5* 25.4*  MCV 73.6* 77.4*  PLT 254 203      Assessment/Plan: GI bleed likely from colon mass (see consult regarding mass). Doubt an upper tract source. Will change to IV PPI Q 12 hours. Defer further transfusion to [facility]. Recommend palliative care. Patient and family do not desire any invasive procedures. Advance diet. Will sign off. Call if questions.   [place] C. 03/04/2015, 8:05 AM

## 2015-03-04 NOTE — Progress Notes (Signed)
Initial Nutrition Assessment  DOCUMENTATION CODES:     INTERVENTION:  Ensure Enlive (each supplement provides 350kcal and 20 grams of protein)- BID  NUTRITION DIAGNOSIS:  Inadequate oral intake related to lethargy/confusion as evidenced by percent weight loss.  GOAL:  Patient will meet greater than or equal to 90% of their needs  MONITOR:  PO intake, Supplement acceptance, Diet advancement, Labs, Weight trends  REASON FOR ASSESSMENT:  Consult Assessment of nutrition requirement/status  ASSESSMENT: 81 y.o. male, with history of hypertension, colon mass (family refused further workup in the past )history of rectal bleeding, CAD, dementia, patient was sent by his PCP office for hemoglobin of 6.1 as part of routine labs done as an outpatient on 4/21  - Pt in room with blankets over his head. Did not respond to loudly calling his name and tapping his leg.  - Nutritional history obtained from chart history and RN.  - Pt ate 100% of full liquid tray at lunch. - Pt's usual body weight is ~170 lbs per chart history. He last weighed this ~2.5 months ago. (7% wt loss- not significant for time frame) - Unable to perform nutrition-focused physical exam due to being unable to see pt.  - Palliative to see pt tomorrow.  - RD will follow up - Labs and medications reviewed.   K 3.4  Hgb 7.6  Height:  Ht Readings from Last 1 Encounters:  03/03/15 5\' 9"  (1.753 m)    Weight:  Wt Readings from Last 1 Encounters:  03/04/15 157 lb 3 oz (71.3 kg)    Ideal Body Weight:  72.7 kg  Wt Readings from Last 10 Encounters:  03/04/15 157 lb 3 oz (71.3 kg)  12/29/14 166 lb 14.2 oz (75.7 kg)  06/05/14 169 lb 12.1 oz (77 kg)  04/15/14 169 lb 15.6 oz (77.1 kg)  02/18/14 150 lb 12.7 oz (68.4 kg)  07/08/13 164 lb 3.2 oz (74.481 kg)  04/29/13 174 lb (78.926 kg)  03/03/13 176 lb (79.833 kg)  01/31/13 180 lb (81.647 kg)  01/05/13 192 lb 10.9 oz (87.4 kg)    BMI:  Body mass index is 23.2  kg/(m^2).  Estimated Nutritional Needs:  Kcal:  1800-2000  Protein:  90-105 g  Fluid:  1.9 L/day  Skin:  Reviewed, no issues  Diet Order:     EDUCATION NEEDS:  Education needs no appropriate at this time   Intake/Output Summary (Last 24 hours) at 03/04/15 1406 Last data filed at 03/04/15 1000  Gross per 24 hour  Intake 2337.08 ml  Output   1575 ml  Net 762.08 ml    Last BM:  Prior to admission  Laurette Schimke [place], [place], [place]

## 2015-03-04 NOTE — Progress Notes (Addendum)
Patient ID: [name] [name], male   DOB: 1925/01/19, 81 y.o.   MRN: [id number] [facility] PROGRESS NOTE  [name] [name] BVA:[id number] DOB: 23-Jan-1925 DOA: 03/03/2015 PCP: Leola Brazil, MD  Brief narrative:    81 y.o. male, with past medical history of hypertension, colon mass and related rectal bleed, dementia, hospitalization in 12/2014 for symptomatic anemia and has required 4 units of PRBC transfusion. Pt presented to [facility] because he was notified by PCP that his hemoglobin was 6.1. Pt is not reliable historian due to dementia.  On admission, pt was hemodynamically stable. His hgb was 5.7. He was started on protonix drip. He received 2 units PRBC transfusion.  Of note, he and his family have declined diagnostic studies in past.  Barrier to discharge: pt still on IV protonix for GI bleed; needs to be seen by PCT. We will repeat CBC tomorrow am and if hgb less than 7 will give another PRBC transfusion.   Assessment/Plan:    Principal Problem:   Lower GI bleed / Rectal bleed in the setting of colon mass / Acute blood loss anemia / Anemia of chronic disease  - Anemia due to combination of rectal bleed from known history of colonic mass  - Hemoglobin on admission 5.7 and status post 2 units of PRBC since admission hemoglobin is 7.6 - Follow up CBC tomorrow morning - Continue protonix drip for total of 72 hours. Continue protonix 40 mg IV Q 12 hours. - Continue supportive care with IV fluids.  - GI has seen the pt in consultation. Since pt declined in past diagnostic studies GI has no further recommendations other than PCT involvement to keep addressing goals of care - Pt hemodynamically stable. Transfer to medical floor.  Active Problems:   Hypokalemia - Unclear etiology - Supplemented in IV fluids       Essential hypertension - Continue metoprolol 50 mg daily and hydralazine 50 mg PO Q 8 hours     Dementia / Depression - Stable - Continue Aricept daily  - Continue  Sertraline 200 mg daily    Dyslipidemia - Continue statin therapy     Protein-calorie malnutrition, severe - In the context of chronic illness  - Nutrition consulted     DVT Prophylaxis  - SCD's bilaterally due to risk of bleeding    Code Status: DNR/DNI Family Communication:  Family not at the bedside this am Disposition Plan: transfer to medical floor today.   IV access:  Peripheral IV  Procedures and diagnostic studies:    No results found.  Medical Consultants:  Gastroenterology, Dr. Wilford Corner Palliative care   Other Consultants:  Physical therapy  IAnti-Infectives:   None    [name] Lenz, MD  [facility] Pager [phone]  Time spent in minutes: 25 minutes  If 7PM-7AM, please contact night-coverage [email or web address] Password [facility] 03/04/2015, 7:12 AM   LOS: 1 day    HPI/Subjective: No acute overnight events. Patient lethargic, briefly wakes up when called his name.  Objective: Filed Vitals:   03/03/15 2355 03/04/15 0025 03/04/15 0355 03/04/15 0400  BP:  161/70  165/76  Pulse:  75  79  Temp: 98.6 F (37 C)  98.5 F (36.9 C)   TempSrc: Oral  Oral   Resp:  13  10  Height:      Weight:   71.3 kg (157 lb 3 oz)   SpO2:  100%  100%    Intake/Output Summary (Last 24 hours) at 03/04/15 7579 Last data filed at 03/04/15 0600  Gross per 24 hour  Intake 2852.08 ml  Output   1125 ml  Net 1727.08 ml    Exam:   General:  Pt is not in acute distress  Cardiovascular: Regular rate and rhythm, S1/S2, no murmurs  Respiratory: Clear to auscultation bilaterally, no wheezing, no crackles, no rhonchi  Abdomen: Soft, non tender, non distended, bowel sounds present  Extremities: trace LE pitting edema, pulses DP and PT palpable bilaterally  Neuro: Grossly nonfocal  Data Reviewed: Basic Metabolic Panel:  Recent Labs Lab 03/03/15 0800 03/04/15 0010  NA 139 137  K 2.9* 3.4*  CL 99 106  CO2 31 29  GLUCOSE 89 80  BUN 12 10  CREATININE  0.97 0.92  CALCIUM 8.6 8.2*   Liver Function Tests:  Recent Labs Lab 03/03/15 0800  AST 24  ALT 11  ALKPHOS 61  BILITOT 0.4  PROT 6.3  ALBUMIN 2.9*   No results for input(s): LIPASE, AMYLASE in the last 168 hours. No results for input(s): AMMONIA in the last 168 hours. CBC:  Recent Labs Lab 03/03/15 0800 03/04/15 0010  WBC 4.6 5.1  NEUTROABS 3.3  --   HGB 5.7* 7.6*  HCT 19.5* 25.4*  MCV 73.6* 77.4*  PLT 254 203   Cardiac Enzymes: No results for input(s): CKTOTAL, CKMB, CKMBINDEX, TROPONINI in the last 168 hours. BNP: Invalid input(s): POCBNP CBG: No results for input(s): GLUCAP in the last 168 hours.  Recent Results (from the past 240 hour(s))  MRSA PCR Screening     Status: None   Collection Time: 03/03/15  3:54 PM  Result Value Ref Range Status   MRSA by PCR NEGATIVE NEGATIVE Final    Comment:        The GeneXpert MRSA Assay (FDA approved for NASAL specimens only), is one component of a comprehensive MRSA colonization surveillance program. It is not intended to diagnose MRSA infection nor to guide or monitor treatment for MRSA infections.   MRSA PCR Screening     Status: None   Collection Time: 03/03/15 10:11 PM  Result Value Ref Range Status   MRSA by PCR NEGATIVE NEGATIVE Final     Scheduled Meds: . sodium chloride   Intravenous Once  . atorvastatin  40 mg Oral q1800  . donepezil  10 mg Oral QHS  . hydrALAZINE  25 mg Oral 3 times per day  . metoprolol  50 mg Oral Daily  . [START ON 03/06/2015] pantoprazole (PROTONIX) IV  40 mg Intravenous Q12H  . potassium chloride  20 mEq Oral Daily  . sertraline  200 mg Oral Daily  . [START ON 03/06/2015] Vitamin D (Ergocalciferol)  50,000 Units Oral Q7 days   Continuous Infusions: . pantoprozole (PROTONIX) infusion 8 mg/hr (03/03/15 2200)

## 2015-03-05 DIAGNOSIS — K6389 Other specified diseases of intestine: Secondary | ICD-10-CM

## 2015-03-05 DIAGNOSIS — F0390 Unspecified dementia without behavioral disturbance: Secondary | ICD-10-CM

## 2015-03-05 DIAGNOSIS — Z66 Do not resuscitate: Secondary | ICD-10-CM | POA: Diagnosis present

## 2015-03-05 DIAGNOSIS — D62 Acute posthemorrhagic anemia: Secondary | ICD-10-CM | POA: Diagnosis present

## 2015-03-05 DIAGNOSIS — R531 Weakness: Secondary | ICD-10-CM

## 2015-03-05 DIAGNOSIS — Z515 Encounter for palliative care: Secondary | ICD-10-CM

## 2015-03-05 DIAGNOSIS — C189 Malignant neoplasm of colon, unspecified: Secondary | ICD-10-CM | POA: Diagnosis present

## 2015-03-05 DIAGNOSIS — D638 Anemia in other chronic diseases classified elsewhere: Secondary | ICD-10-CM

## 2015-03-05 LAB — BASIC METABOLIC PANEL
Anion gap: 3 — ABNORMAL LOW (ref 5–15)
BUN: 9 mg/dL (ref 6–23)
CO2: 29 mmol/L (ref 19–32)
CREATININE: 0.99 mg/dL (ref 0.50–1.35)
Calcium: 8.5 mg/dL (ref 8.4–10.5)
Chloride: 108 mmol/L (ref 96–112)
GFR, EST AFRICAN AMERICAN: 83 mL/min — AB (ref >90)
GFR, EST NON AFRICAN AMERICAN: 71 mL/min — AB (ref >90)
Glucose, Bld: 99 mg/dL (ref 70–99)
Potassium: 4.7 mmol/L (ref 3.5–5.1)
Sodium: 140 mmol/L (ref 135–145)

## 2015-03-05 LAB — CBC
HCT: 27.1 % — ABNORMAL LOW (ref 39.0–52.0)
HEMOGLOBIN: 8 g/dL — AB (ref 13.0–17.0)
MCH: 23.1 pg — ABNORMAL LOW (ref 26.0–34.0)
MCHC: 29.5 g/dL — AB (ref 30.0–36.0)
MCV: 78.3 fL (ref 78.0–100.0)
PLATELETS: 212 10*3/uL (ref 150–400)
RBC: 3.46 MIL/uL — ABNORMAL LOW (ref 4.22–5.81)
RDW: 22.3 % — AB (ref 11.5–15.5)
WBC: 5.8 10*3/uL (ref 4.0–10.5)

## 2015-03-05 MED ORDER — LORAZEPAM 2 MG/ML IJ SOLN
1.0000 mg | INTRAMUSCULAR | Status: DC | PRN
  Administered 2015-03-05: 1 mg via INTRAVENOUS
  Filled 2015-03-05: qty 1

## 2015-03-05 MED ORDER — ATORVASTATIN CALCIUM 20 MG PO TABS
20.0000 mg | ORAL_TABLET | Freq: Every day | ORAL | Status: DC
  Administered 2015-03-06 – 2015-03-07 (×2): 20 mg via ORAL
  Filled 2015-03-05 (×6): qty 1
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
## 2015-03-05 NOTE — Progress Notes (Signed)
Pt now calmer and resting better with decreased yelling and agitation after PRN medication given per new orders. Will maintain all safety measures for Pt. Pt's wife given update via phone

## 2015-03-05 NOTE — Progress Notes (Signed)
MD updated via phone regarding Pt's agitation. New orders received

## 2015-03-05 NOTE — Consult Note (Signed)
Consultation Note Date: 03/05/2015   Patient Name: [name] [name]  DOB: 1925/09/07  MRN: [id number]  Age / Sex: 81 y.o., male   PCP: Vincente Liberty, MD Referring Physician: Robbie Lis, MD  Reason for Consultation: Disposition, Establishing goals of care, Non pain symptom management and Psychosocial/spiritual support  Palliative Care Assessment and Plan Summary of Established Goals of Care and Medical Treatment Preferences    Palliative Care Discussion Held Today:   This NP Wadie Lessen reviewed medical records, received report from team, assessed the patient and then meet at the patient's bedside along with his wife and daughter  to discuss diagnosis prognosis, GOC, EOL wishes disposition and options.   A detailed discussion was had today regarding advanced directives.  Concepts specific to code status, artifical feeding and hydration, continued IV antibiotics and rehospitalization was had.  The difference between a aggressive medical intervention path  and a palliative comfort care path for this patient at this time was had.  Values and goals of care important to patient and family were attempted to be elicited.  Concept of Hospice and Palliative Care were discussed  Natural trajectory and expectations at EOL were discussed.  Questions and concerns addressed.  Hard Choices booklet left for review. Family encouraged to call with questions or concerns.  PMT will continue to support holistically.   Contacts/Participants in Discussion: Primary Decision Maker: wife/[name] [name]  HCPOA: non-documented   Code Status/Advance Care Planning:  DNR/DNI   Symptom Management:   Weakness: movement and exercise as tolerated   Psycho-social/Spiritual:   Support System: family, wife is main caregiver  Desire for further Chaplaincy support:no  Prognosis: < 6 months  Discharge Planning:  Home with Hospice, will write for choice       Chief Complaint/History of Present Illness:   Weakness, tarry stools  81 y.o. male, with past medical history of hypertension, colon mass and related rectal bleed, dementia, hospitalization in 12/2014 for symptomatic anemia and has required 4 units of PRBC transfusion. Pt presented to [facility] because he was notified by PCP that his hemoglobin was 6.1. Pt is not reliable historian due to dementia.   On admission, pt was hemodynamically stable. His hgb was 5.7. He was started on protonix drip. He received 2 units PRBC transfusion.   Family's hope is for comfort, they do not desire further testing or diagnostics however they are open to transfusion in the future if it will enhance comfort.  (they are at this time processing role of transfusions) Primary Diagnoses   Present on Admission:  . (Resolved) GI bleed . (Resolved) Anemia . Hypertension . Protein-calorie malnutrition, severe . Dementia . Lower GI bleed . Colonic mass . Anemia of chronic disease . Hypokalemia . Depression  Palliative Review of Systems:   Unable to illicit 2/2 to decreased cognition and HOH  I have reviewed the medical record, interviewed the patient and family, and examined the patient. The following aspects are pertinent.  Past Medical History  Diagnosis Date  . Hypertension   . Coronary artery disease   . MI (myocardial infarction)   . AAA (abdominal aortic aneurysm)   . Dementia   . Sleep apnea   . GERD (gastroesophageal reflux disease)   . Arthritis   . RA (rheumatoid arthritis)   . Cancer     PROSTATE CA  . Anemia 12/28/2014  . History of rectal bleeding    History   Social History  . Marital Status: Married    Spouse Name: N/A  .  Number of Children: N/A  . Years of Education: N/A   Social History Main Topics  . Smoking status: Never Smoker   . Smokeless tobacco: Never Used  . Alcohol Use: No  . Drug Use: No  . Sexual Activity: No   Other Topics Concern  . None   Social History Narrative   Family History  Problem Relation Age  of Onset  . Hypertension Brother    Scheduled Meds: . sodium chloride   Intravenous Once  . atorvastatin  40 mg Oral q1800  . donepezil  10 mg Oral QHS  . feeding supplement (ENSURE ENLIVE)  237 mL Oral BID BM  . hydrALAZINE  50 mg Oral 3 times per day  . metoprolol  50 mg Oral Daily  . pantoprazole (PROTONIX) IV  40 mg Intravenous Q12H  . sertraline  200 mg Oral Daily   Continuous Infusions:  PRN Meds:.acetaminophen **OR** acetaminophen, albuterol, hydrALAZINE Medications Prior to Admission:  Prior to Admission medications   Medication Sig Start Date End Date Taking? Authorizing Provider  amLODipine (NORVASC) 10 MG tablet Take 10 mg by mouth daily.   Yes Historical Provider, MD  calcium carbonate (OS-CAL) 600 MG TABS tablet Take 600 mg by mouth daily with breakfast.   Yes Historical Provider, MD  donepezil (ARICEPT) 10 MG tablet Take 10 mg by mouth at bedtime.   Yes Historical Provider, MD  ferrous sulfate 325 (65 FE) MG tablet Take 1 tablet (325 mg total) by mouth 2 (two) times daily with a meal. 06/06/14  Yes Thurnell Lose, MD  furosemide (LASIX) 20 MG tablet Take 20 mg by mouth daily.   Yes Historical Provider, MD  hydrALAZINE (APRESOLINE) 25 MG tablet Take 1 tablet (25 mg total) by mouth every 8 (eight) hours. 06/06/14  Yes Thurnell Lose, MD  iron polysaccharides (NU-IRON) 150 MG capsule Take 1 capsule (150 mg total) by mouth 2 (two) times daily. 12/30/14  Yes Reyne Dumas, MD  metoprolol (LOPRESSOR) 50 MG tablet Take 50 mg by mouth daily.    Yes Historical Provider, MD  pantoprazole (PROTONIX) 40 MG tablet Take 1 tablet (40 mg total) by mouth daily. Switch for any other PPI at similar dose and frequency 04/15/14  Yes Debbe Odea, MD  sertraline (ZOLOFT) 100 MG tablet Take 200 mg by mouth daily.   Yes Historical Provider, MD  simvastatin (ZOCOR) 80 MG tablet Take 40 mg by mouth at bedtime.   Yes Historical Provider, MD   No Known Allergies CBC:    Component Value Date/Time    WBC 5.8 03/05/2015 0446   WBC 5.9 08/19/2013 1015   HGB 8.0* 03/05/2015 0446   HGB 8.8* 08/19/2013 1015   HCT 27.1* 03/05/2015 0446   HCT 27.1* 08/19/2013 1015   PLT 212 03/05/2015 0446   PLT 174 08/19/2013 1015   MCV 78.3 03/05/2015 0446   MCV 87.6 08/19/2013 1015   NEUTROABS 3.3 03/03/2015 0800   NEUTROABS 3.4 08/19/2013 1015   LYMPHSABS 0.6* 03/03/2015 0800   LYMPHSABS 1.5 08/19/2013 1015   MONOABS 0.6 03/03/2015 0800   MONOABS 0.7 08/19/2013 1015   EOSABS 0.1 03/03/2015 0800   EOSABS 0.2 08/19/2013 1015   BASOSABS 0.0 03/03/2015 0800   BASOSABS 0.1 08/19/2013 1015   Comprehensive Metabolic Panel:    Component Value Date/Time   NA 140 03/05/2015 0446   K 4.7 03/05/2015 0446   CL 108 03/05/2015 0446   CO2 29 03/05/2015 0446   BUN 9 03/05/2015 0446  CREATININE 0.99 03/05/2015 0446   GLUCOSE 99 03/05/2015 0446   CALCIUM 8.5 03/05/2015 0446   AST 24 03/03/2015 0800   ALT 11 03/03/2015 0800   ALKPHOS 61 03/03/2015 0800   BILITOT 0.4 03/03/2015 0800   PROT 6.3 03/03/2015 0800   ALBUMIN 2.9* 03/03/2015 0800    Physical Exam: Vital Signs: BP 150/73 mmHg  Pulse 66  Temp(Src) 100 F (37.8 C) (Oral)  Resp 16  Ht 5\' 9"  (1.753 m)  Wt 71.3 kg (157 lb 3 oz)  BMI 23.20 kg/m2  SpO2 100% SpO2: SpO2: 100 % O2 Device: O2 Device: Not Delivered O2 Flow Rate: O2 Flow Rate (L/min): 2 L/min Intake/output summary:  Intake/Output Summary (Last 24 hours) at 03/05/15 1304 Last data filed at 03/05/15 0700  Gross per 24 hour  Intake    260 ml  Output    600 ml  Net   -340 ml   LBM:   Baseline Weight: Weight: 66.6 kg (146 lb 13.2 oz) Most recent weight: Weight: 71.3 kg (157 lb 3 oz)  Exam Findings: General: chronically ill appearing, OOB to chair, NAD HEENT: moist buccal membranes, no dentation CV: RRR  Respiratory:decreased in bases Abdomen: Soft, Non-tender Extremities: BLE trace edema        Palliative Performance Scale: 30 % at best              Additional Data  Reviewed: Recent Labs     03/04/15  0010  03/05/15  0446  WBC  5.1  5.8  HGB  7.6*  8.0*  PLT  203  212  NA  137  140  BUN  10  9  CREATININE  0.92  0.99     Time In: 1200 Time Out: 1320 Time Total:  80 min  Greater than 50%  of this time was spent counseling and coordinating care related to the above assessment and plan.  Signed by: Wadie Lessen, NP  Knox Royalty, NP  03/05/2015, 1:04 PM  Please contact Palliative Medicine Team phone at [phone] for questions and concerns.

## 2015-03-05 NOTE — Care Management (Signed)
CARE MANAGEMENT NOTE 03/05/2015  Patient:  [name]   Account Number:  [id number]  Date Initiated:  03/04/2015  Documentation initiated by:  DAVIS,RHONDA  Subjective/Objective Assessment:   gi bleeding with svt and hgb 5.7 requring bld transfusions     Action/Plan:   home when stable.Has cane,rw.   Anticipated DC Date:  03/06/2015   Anticipated DC Plan:  [place]  CM consult      [facility] Choice  NA   Choice offered to / List presented to:  C-3 Spouse           Status of service:  In process, will continue to follow Medicare Important Message given?  YES (If response is "NO", the following Medicare IM given date fields will be blank) Date Medicare IM given:  03/05/2015 Medicare IM given by:  [facility] Date Additional Medicare IM given:   Additional Medicare IM given by:    Discharge Disposition:    Per UR Regulation:  Reviewed for med. necessity/level of care/duration of stay  If discussed at [place] of Stay Meetings, dates discussed:    Comments:  03/05/15 Dessa Phi RN BSN NCM [phone] Referral for home hospice choice.[place] to spouse on phone about home hospice providers list, & services offered,informed to choose an agency(left in top draw @ bedside, per spouse request).She is aware of d/c in am if stable.Await choice.Spouse says they will transport home on their own.  March 04, 2015/Rhonda L. Rosana Hoes, RN, BSN, CCM. Case Management [place] [phone] No discharge needs present of time of review.

## 2015-03-05 NOTE — Evaluation (Signed)
Physical Therapy Evaluation Patient Details Name: [name] [name] MRN: [id number] DOB: January 07, 1925 Today's Date: 03/05/2015   History of Present Illness  81 yo male admitted with lower GI bleed. hx of HTN, colon mass, dementia, MI, prostate cancer, RA, AAA. Pt is very HOH  Clinical Impression  On eval, pt required Min assist for mobility-able to perform stand pivot from bed to recliner with use of RW. Pt declined ambulation at time of eval. Recommend HHPT and 24 hour supervision/assist.     Follow Up Recommendations Home health PT;Supervision/Assistance - 24 hour    Equipment Recommendations   (RW if pt does not already have)    Recommendations for Other Services OT consult     Precautions / Restrictions Precautions Precautions: Fall Restrictions Weight Bearing Restrictions: No      Mobility  Bed Mobility Overal bed mobility: Needs Assistance Bed Mobility: Supine to Sit     Supine to sit: Supervision        Transfers Overall transfer level: Needs assistance Equipment used: Rolling walker (2 wheeled) Transfers: Sit to/from Stand Sit to Stand: Min assist         General transfer comment: Assist to rise, stabilize, control descent. Stand pivot from bed to recliner with RW  Ambulation/Gait             General Gait Details: NT-pt declined at time of eval  Stairs            Wheelchair Mobility    Modified Rankin (Stroke Patients Only)       Balance Overall balance assessment: Needs assistance         Standing balance support: Bilateral upper extremity supported;During functional activity Standing balance-Leahy Scale: Poor                               Pertinent Vitals/Pain Pain Assessment: No/denies pain    Home Living Family/patient expects to be discharged to:: Private residence   Available Help at Discharge: Family           Home Equipment: Kasandra Knudsen - single point;Walker - 4 wheels Additional Comments: Pt unable to  answer some questions about home environment.     Prior Function                 Hand Dominance        Extremity/Trunk Assessment   Upper Extremity Assessment: Generalized weakness           Lower Extremity Assessment: Generalized weakness      Cervical / Trunk Assessment: Kyphotic  Communication   Communication: HOH  Cognition Arousal/Alertness: Awake/alert Behavior During Therapy: WFL for tasks assessed/performed Overall Cognitive Status: Difficult to assess                      General Comments      Exercises        Assessment/Plan    PT Assessment Patient needs continued PT services  PT Diagnosis Generalized weakness   PT Problem List Decreased strength;Decreased activity tolerance;Decreased balance;Decreased mobility;Decreased cognition;Decreased knowledge of use of DME  PT Treatment Interventions DME instruction;Gait training;Functional mobility training;Patient/family education;Therapeutic activities;Therapeutic exercise;Balance training   PT Goals (Current goals can be found in the Care Plan section) Acute Rehab PT Goals Patient Stated Goal: none stated PT Goal Formulation: Patient unable to participate in goal setting Time For Goal Achievement: 03/19/15 Potential to Achieve Goals: Good    Frequency Min 3X/week  Barriers to discharge        Co-evaluation               End of Session Equipment Utilized During Treatment: Gait belt Activity Tolerance: Patient tolerated treatment well Patient left: in chair;with call bell/phone within reach;with chair alarm set           Time: 1188-6773 PT Time Calculation (min) (ACUTE ONLY): 11 min   Charges:   PT Evaluation $Initial PT Evaluation Tier I: 1 Procedure     PT G Codes:        Weston Anna, MPT Pager: [phone]

## 2015-03-05 NOTE — Progress Notes (Signed)
Patient ID: [name] [name], male   DOB: 04-22-1925, 81 y.o.   MRN: [id number] [facility] PROGRESS NOTE  [name] [name] [id number]:[id number] DOB: 22-Dec-1924 DOA: 03/03/2015 PCP: [name] Brazil, MD  Brief narrative:    81 y.o. male, with past medical history of hypertension, colon mass and related rectal bleed, dementia, hospitalization in 12/2014 for symptomatic anemia and has required 4 units of PRBC transfusion. Pt presented to [facility] because he was notified by PCP that his hemoglobin was 6.1. Pt is not reliable historian due to dementia.   On admission, pt was hemodynamically stable. His hgb was 5.7. He was started on protonix drip. He received 2 units PRBC transfusion.  Of note, he and his family have declined diagnostic studies in past.  Barrier to discharge: We will continue IV Protonix. Protonix drip was discontinued. No further reports of rectal bleed. Awaiting palliative care team to meet with the family to discuss goals of care. Likely discharge in next 24 hours.  Assessment/Plan:    Principal Problem:   Lower GI bleed / Rectal bleed in the setting of colon mass / Acute blood loss anemia / Anemia of chronic disease  - Rectal bleed, anemia likely secondary to patient's known history of colonic mass. - In past, patient has declined diagnostic studies. He has been treated symptomatically for anemia with blood transfusions. - Hemoglobin was 5.7 on the admission. He has received a total of 2 units of PRBC transfusion during this hospital stay. - Hemoglobin is 8 this morning. - He was on Protonix drip since the admission. Protonix drip now discontinued. Continue Protonix IV every 12 hours. - GI has seen the patient in consultation with no further recommendations other than getting palliative care to define goals of care.   Active Problems:   Hypokalemia - Unclear etiology - Supplemented in IV fluids and it is within normal limits this morning.      Essential hypertension -  Continue metoprolol 50 mg daily and hydralazine 50 mg PO Q 8 hours  - Blood pressure is 150/73. This is acceptable considering patient's age.    Dementia / Depression - Stable - Continue Aricept daily  - Continue Sertraline 200 mg daily    Dyslipidemia - Continue statin therapy     Protein-calorie malnutrition, severe - In the context of chronic illness  - Nutrition consulted - Diet as tolerated.     DVT Prophylaxis  - SCD's bilaterally due to risk of bleeding    Code Status: DNR/DNI Family Communication:  Family not at the bedside this am Disposition Plan: Awaiting palliative care team meeting for goals of care. Most likely will go either home or possible skilled nursing facility depending on physical therapy evaluation.  IV access:  Peripheral IV  Procedures and diagnostic studies:    No results found.  Medical Consultants:  Gastroenterology, [name] [name] Palliative care   Other Consultants:  Physical therapy  IAnti-Infectives:   None    [name] Lenz, MD  [facility] Pager [phone]  Time spent in minutes: 15 minutes  If 7PM-7AM, please contact night-coverage [email or web address] Password [email or web address] 03/05/2015, 11:04 AM   LOS: 2 days    HPI/Subjective: No acute overnight events. Patient wakes up briefly on painful and verbal stimuli.  Objective: Filed Vitals:   03/04/15 1324 03/04/15 1431 03/04/15 2108 03/05/15 0520  BP: 175/74 120/58 107/60 150/73  Pulse: 63 67 71 66  Temp:  98 F (36.7 C) 98.8 F (37.1 C) 100 F (37.8 C)  TempSrc:  Oral Oral  Oral  Resp: 11 15 16 16   Height:      Weight:      SpO2: 100% 100% 100% 100%    Intake/Output Summary (Last 24 hours) at 03/05/15 1104 Last data filed at 03/05/15 0700  Gross per 24 hour  Intake    260 ml  Output    600 ml  Net   -340 ml    Exam:   General:  Pt is sleeping, no distress  Cardiovascular: Rate controlled, appreciate S1-S2  Respiratory: No wheezing or rhonchi, no  crackles  Abdomen: Nontender and nondistended abdomen, appreciate bowel sounds  Extremities: Pulses palpable, trace swelling in legs.  Neuro: No focal deficits  Data Reviewed: Basic Metabolic Panel:  Recent Labs Lab 03/03/15 0800 03/04/15 0010 03/05/15 0446  NA 139 137 140  K 2.9* 3.4* 4.7  CL 99 106 108  CO2 31 29 29   GLUCOSE 89 80 99  BUN 12 10 9   CREATININE 0.97 0.92 0.99  CALCIUM 8.6 8.2* 8.5   Liver Function Tests:  Recent Labs Lab 03/03/15 0800  AST 24  ALT 11  ALKPHOS 61  BILITOT 0.4  PROT 6.3  ALBUMIN 2.9*   No results for input(s): LIPASE, AMYLASE in the last 168 hours. No results for input(s): AMMONIA in the last 168 hours. CBC:  Recent Labs Lab 03/03/15 0800 03/04/15 0010 03/05/15 0446  WBC 4.6 5.1 5.8  NEUTROABS 3.3  --   --   HGB 5.7* 7.6* 8.0*  HCT 19.5* 25.4* 27.1*  MCV 73.6* 77.4* 78.3  PLT 254 203 212   Cardiac Enzymes: No results for input(s): CKTOTAL, CKMB, CKMBINDEX, TROPONINI in the last 168 hours. BNP: Invalid input(s): POCBNP CBG: No results for input(s): GLUCAP in the last 168 hours.  Recent Results (from the past 240 hour(s))  MRSA PCR Screening     Status: None   Collection Time: 03/03/15  3:54 PM  Result Value Ref Range Status   MRSA by PCR NEGATIVE NEGATIVE Final  MRSA PCR Screening     Status: None   Collection Time: 03/03/15 10:11 PM  Result Value Ref Range Status   MRSA by PCR NEGATIVE NEGATIVE Final     Scheduled Meds: . sodium chloride   Intravenous Once  . atorvastatin  40 mg Oral q1800  . donepezil  10 mg Oral QHS  . feeding supplement (ENSURE ENLIVE)  237 mL Oral BID BM  . hydrALAZINE  50 mg Oral 3 times per day  . metoprolol  50 mg Oral Daily  . pantoprazole (PROTONIX) IV  40 mg Intravenous Q12H  . sertraline  200 mg Oral Daily   Continuous Infusions:

## 2015-03-06 NOTE — Progress Notes (Signed)
Patient ID: [name] [name], male   DOB: 10/05/1925, 81 y.o.   MRN: [id number] [facility] PROGRESS NOTE  [name] MRN:[id number] DOB: 08/27/1925 DOA: 03/03/2015 PCP: KILPATRICK JR,GEORGE R, MD  Brief narrative:    81 y.o. male, with past medical history of hypertension, colon mass and related rectal bleed, dementia, hospitalization in 12/2014 for symptomatic anemia and has required 4 units of PRBC transfusion. Pt presented to [facility] because he was notified by PCP that his hemoglobin was 6.1. Pt is not reliable historian due to dementia.   On admission, pt was hemodynamically stable. His hgb was 5.7. Pt received 2 units PRBC during this hospital stay with appropriate rise in hemoglobin. He was on protonix drip on admission.  Barrier to discharge: Wound observe for next 24 hours and likely home in next 24 hours with hospice.   Assessment/Plan:     Principal Problem:  Lower GI bleed / Rectal bleed in the setting of colon mass / Acute blood loss anemia / Anemia of chronic disease  - Secondary to patient's known history of colonic mass.  - Palliative care has met with the family, plan to d/c home with hospice once medically stable. - S/P 2 units PRBC transfusion during this hospital stay. - Check CBC tomorrow am. - Continue protonix 40 mg IV Q 12 hours. Initially on protonix drip. - Diet as tolerated.   Active Problems:  Hypokalemia - Unclear etiology. Repleted. Check BMP tomorrow am.    Essential hypertension - Continue metoprolol 50 mg daily and hydralazine 50 mg PO Q 8 hours    Dementia / Depression - Continue Aricept daily  - Continue Sertraline 200 mg daily - Added ativan 1 mg every 6 hours PRN agitation or confusion.    Dyslipidemia - Continue statin therapy    Protein-calorie malnutrition, severe - In the context of chronic illness  - Continue nutritional supplementation - Nutritionist has seen the pt in consultation.    DVT Prophylaxis  - SCD's  bilaterally due to risk of bleeding    Code Status: DNR/DNI Family Communication: Family not at the bedside this am Disposition Plan: Home with hospice likely in next 24 hours   IV access:  Peripheral IV  Procedures and diagnostic studies:    No results found.  Medical Consultants:  Gastroenterology, Dr. Vincent Schooler Palliative care   Other Consultants:  Physical therapy  IAnti-Infectives:   None    , , MD  [facility] Pager [phone]  Time spent in minutes: 15 minutes  If 7PM-7AM, please contact night-coverage [email or web address] Password [email or web address] 03/06/2015, 10:22 AM   LOS: 3 days    HPI/Subjective: No acute overnight events. Was agitated during the afternoon yesterday and pulling on IV line. Now calm.  Objective: Filed Vitals:   03/06/15 0210 03/06/15 0307 03/06/15 0355 03/06/15 0627  BP: 196/89 149/81 182/87 162/88  Pulse: 72 76 73   Temp:   98 F (36.7 C)   TempSrc:   Oral   Resp:      Height:      Weight:      SpO2: 100%  100%     Intake/Output Summary (Last 24 hours) at 03/06/15 1022 Last data filed at 03/06/15 0624  Gross per 24 hour  Intake      0 ml  Output   3925 ml  Net  -3925 ml    Exam:   General:  Pt is alert, follows commands appropriately, not in acute distress  Cardiovascular: Regular rate and rhythm, S1/S2, no   murmurs  Respiratory: Clear to auscultation bilaterally, no wheezing, no crackles, no rhonchi  Abdomen: Soft, non tender, non distended, bowel sounds present  Extremities: No edema, pulses DP and PT palpable bilaterally  Neuro: Grossly nonfocal  Data Reviewed: Basic Metabolic Panel:  Recent Labs Lab 03/03/15 0800 03/04/15 0010 03/05/15 0446  NA 139 137 140  K 2.9* 3.4* 4.7  CL 99 106 108  CO2 31 29 29  GLUCOSE 89 80 99  BUN 12 10 9  CREATININE 0.97 0.92 0.99  CALCIUM 8.6 8.2* 8.5   Liver Function Tests:  Recent Labs Lab 03/03/15 0800  AST 24  ALT 11  ALKPHOS 61  BILITOT 0.4   PROT 6.3  ALBUMIN 2.9*   No results for input(s): LIPASE, AMYLASE in the last 168 hours. No results for input(s): AMMONIA in the last 168 hours. CBC:  Recent Labs Lab 03/03/15 0800 03/04/15 0010 03/05/15 0446  WBC 4.6 5.1 5.8  NEUTROABS 3.3  --   --   HGB 5.7* 7.6* 8.0*  HCT 19.5* 25.4* 27.1*  MCV 73.6* 77.4* 78.3  PLT 254 203 212   Cardiac Enzymes: No results for input(s): CKTOTAL, CKMB, CKMBINDEX, TROPONINI in the last 168 hours. BNP: Invalid input(s): POCBNP CBG: No results for input(s): GLUCAP in the last 168 hours.  MRSA PCR Screening     Status: None   Collection Time: 03/03/15  3:54 PM  Result Value Ref Range Status   MRSA by PCR NEGATIVE NEGATIVE Final  MRSA PCR Screening     Status: None   Collection Time: 03/03/15 10:11 PM  Result Value Ref Range Status   MRSA by PCR NEGATIVE NEGATIVE Final     Scheduled Meds: . atorvastatin  20 mg Oral q1800  . donepezil  10 mg Oral QHS  . feeding supplement (ENSURE ENLIVE)  237 mL Oral BID BM  . hydrALAZINE  50 mg Oral 3 times per day  . metoprolol  50 mg Oral Daily  . pantoprazole (PROTONIX) IV  40 mg Intravenous Q12H  . sertraline  200 mg Oral Daily      

## 2015-03-06 NOTE — Progress Notes (Signed)
Placed orders for meals to include supper for this evening, breakfast and lunch orders for tomorrow 5/1. Vwilliams,rn.

## 2015-03-06 NOTE — Progress Notes (Signed)
Pt has refused all medications and PO intakes. Pt states " I want to get out of here". Vw, rn.

## 2015-03-07 DIAGNOSIS — F329 Major depressive disorder, single episode, unspecified: Secondary | ICD-10-CM

## 2015-03-07 MED ORDER — PANTOPRAZOLE SODIUM 40 MG PO TBEC
40.0000 mg | DELAYED_RELEASE_TABLET | Freq: Every day | ORAL | Status: DC
  Administered 2015-03-07 – 2015-03-08 (×2): 40 mg via ORAL
  Filled 2015-03-07 (×2): qty 1

## 2015-03-07 NOTE — Progress Notes (Addendum)
Patient ID: [name] [name], male   DOB: [birth year], 81 y.o.   MRN: [id number] [facility] PROGRESS NOTE  [name] [name] [id number]:[id number] DOB: 07/27/1925 DOA: 03/03/2015 PCP: Leola Brazil, MD  Brief narrative:    81 y.o. male, with past medical history of hypertension, colon mass and related rectal bleed, dementia, hospitalization in 12/2014 for symptomatic anemia and has required 4 units of PRBC transfusion. Pt presented to [facility] because he was notified by PCP that his hemoglobin was 6.1. Pt is not reliable historian due to dementia.   On admission, pt was hemodynamically stable. Hemoglobin was 5.7. Pt received 2 units PRBC during this hospital stay with appropriate rise in hemoglobin. He was on protonix drip on admission.  Barrier to discharge: Hypotensive this am so continue to monitor for next 24 hours. Anticipate D?C 5/216. Spoke with his wife who is agreeable to D/C tomorrow.  Assessment/Plan:     Principal Problem:  Lower GI bleed / Rectal bleed in the setting of colon mass / Acute blood loss anemia / Anemia of chronic disease  - Secondary to patient's known history of colonic mass.  - Appreciate PCT meeting with family, addressing goals of care - S/P 2 units PRBC transfusion during this hospital stay. - Check CBC tomorrow am - Continue protonix daily.   Active Problems:  Hypokalemia - Unclear etiology. Repleted and follow up potassium WNL.    Essential hypertension - Continue metoprolol 50 mg daily and hydralazine 50 mg PO Q 8 hours    Dementia / Depression - Continue Aricept daily  - Continue Sertraline 200 mg daily   Dyslipidemia - Continue statin therapy    Protein-calorie malnutrition, severe - In the context of chronic illness  - Continue nutritional supplementation   DVT Prophylaxis  - SCD's bilaterally due to risk of bleeding    Code Status: DNR/DNI Family Communication: Family not at the bedside this am; talked to his wife over  the phone [phone]. Disposition Plan: Home with hospice likely in next 24 hours   IV access:  Peripheral IV  Procedures and diagnostic studies:    No results found.  Medical Consultants:  Gastroenterology, Dr. Wilford Corner Palliative care   Other Consultants:  Physical therapy Nutrition   IAnti-Infectives:   None    Leisa Lenz, MD  [facility] Pager [phone]  Time spent in minutes: 15 minutes  If 7PM-7AM, please contact night-coverage [email or web address] Password [facility] 03/07/2015, 7:56 PM   LOS: 4 days    HPI/Subjective: No acute overnight events. No vomiting, no respiratory distress.   Objective: Filed Vitals:   03/06/15 2122 03/07/15 0547 03/07/15 0950 03/07/15 1536  BP: 94/54 134/76 98/56 102/54  Pulse: 94 78 87 108  Temp: 99.2 F (37.3 C) 99.1 F (37.3 C)  99.7 F (37.6 C)  TempSrc: Oral Oral  Oral  Resp: 16 16  16   Height:      Weight:      SpO2: 100% 100%  100%    Intake/Output Summary (Last 24 hours) at 03/07/15 1956 Last data filed at 03/07/15 1757  Gross per 24 hour  Intake   1460 ml  Output   1750 ml  Net   -290 ml    Exam:   General:  Pt is not in distress  Cardiovascular: tachycardic, appreciate S1,S2  Respiratory: bilateral air entry, no wheezing   Abdomen: non tender, non distended, (+) BS  Extremities: No leg swelling, pulses palpable   Neuro: non focal   Data Reviewed: Basic Metabolic Panel:  Recent Labs Lab 03/03/15 0800 03/04/15 0010 03/05/15 0446  NA 139 137 140  K 2.9* 3.4* 4.7  CL 99 106 108  CO2 31 29 29   GLUCOSE 89 80 99  BUN 12 10 9   CREATININE 0.97 0.92 0.99  CALCIUM 8.6 8.2* 8.5   Liver Function Tests:  Recent Labs Lab 03/03/15 0800  AST 24  ALT 11  ALKPHOS 61  BILITOT 0.4  PROT 6.3  ALBUMIN 2.9*   No results for input(s): LIPASE, AMYLASE in the last 168 hours. No results for input(s): AMMONIA in the last 168 hours. CBC:  Recent Labs Lab 03/03/15 0800 03/04/15 0010  03/05/15 0446  WBC 4.6 5.1 5.8  NEUTROABS 3.3  --   --   HGB 5.7* 7.6* 8.0*  HCT 19.5* 25.4* 27.1*  MCV 73.6* 77.4* 78.3  PLT 254 203 212   Cardiac Enzymes: No results for input(s): CKTOTAL, CKMB, CKMBINDEX, TROPONINI in the last 168 hours. BNP: Invalid input(s): POCBNP CBG: No results for input(s): GLUCAP in the last 168 hours.  MRSA PCR Screening     Status: None   Collection Time: 03/03/15  3:54 PM  Result Value Ref Range Status   MRSA by PCR NEGATIVE NEGATIVE Final  MRSA PCR Screening     Status: None   Collection Time: 03/03/15 10:11 PM  Result Value Ref Range Status   MRSA by PCR NEGATIVE NEGATIVE Final     Scheduled Meds: . atorvastatin  20 mg Oral q1800  . donepezil  10 mg Oral QHS  . feeding supplement (ENSURE ENLIVE)  237 mL Oral BID BM  . hydrALAZINE  50 mg Oral 3 times per day  . metoprolol  50 mg Oral Daily  . pantoprazole (PROTONIX) IV  40 mg Intravenous Q12H  . sertraline  200 mg Oral Daily

## 2015-03-07 NOTE — Care Management Note (Addendum)
Case Management Note  Patient Details  Name: [name] [name] MRN: [id number] Date of Birth: Jan 26, 1925  Subjective/Objective:                  Lower GI bleed  Action/Plan:  Home with wife  Expected Discharge Date: 03-08-2015         Expected Discharge Plan:  Home w Hospice Care  In-House Referral:     Discharge planning Services  CM Consult  Post Acute Care Choice:    Choice offered to:     DME Arranged:    DME Agency:     HH Arranged:    [place]:     Status of Service:     Medicare Important Message Given:  Yes Date Medicare IM Given:  03/07/15 Medicare IM give by:   Jonnie Finner RN CCM ) Date Additional Medicare IM Given:    Additional Medicare Important Message give by:     If discussed at [place] of Stay Meetings, dates discussed:    Additional Comments:  NCM spoke to pt and requested NCM speak to wife. Attempted call to speak to wife, left message on voicemail. Dtr's number was not accepting calls at this time. Attempt to arrange Home Hospice. Jonnie Finner RN CCM Case Mgmt phone [phone]  03/07/2015 1445 NCM spoke to pt's wife and offered choice for Hospice. Provided Hospice list. Wife decided on [facility] and [facility]. Faxed referral to [facility]. Called [facility] with new referral. Wife states she has RW and 3n1 at home.  Jonnie Finner RN CCM Case Mgmt phone [phone]  Erenest Rasher, RN 03/07/2015, 2:12 PM

## 2015-03-07 NOTE — Plan of Care (Signed)
Problem: Phase I Progression Outcomes Goal: OOB as tolerated unless otherwise ordered Outcome: Not Met (add Reason) Offered and attempted to get pt OOB several times this shift yet pt refused each time. Turn every 2 hours while in bed.

## 2015-03-08 DIAGNOSIS — E876 Hypokalemia: Secondary | ICD-10-CM

## 2015-03-08 LAB — CBC
HCT: 26.1 % — ABNORMAL LOW (ref 39.0–52.0)
HEMOGLOBIN: 7.9 g/dL — AB (ref 13.0–17.0)
MCH: 23.2 pg — AB (ref 26.0–34.0)
MCHC: 30.3 g/dL (ref 30.0–36.0)
MCV: 76.8 fL — ABNORMAL LOW (ref 78.0–100.0)
PLATELETS: 193 10*3/uL (ref 150–400)
RBC: 3.4 MIL/uL — ABNORMAL LOW (ref 4.22–5.81)
RDW: 23 % — ABNORMAL HIGH (ref 11.5–15.5)
WBC: 5.9 10*3/uL (ref 4.0–10.5)

## 2015-03-08 MED ORDER — ENSURE ENLIVE PO LIQD: 237.0000 mL | Freq: Two times a day (BID) | ORAL | Status: AC

## 2015-03-08 NOTE — Discharge Summary (Addendum)
Physician Discharge Summary  [name] [name] TKW:[id number] DOB: Jul 11, 1925 DOA: 03/03/2015  PCP: Leola Brazil, MD  Admit date: 03/03/2015 Discharge date: 03/08/2015  Time spent: 35 minutes  Recommendations for Outpatient Follow-up:  1. Discharge home with home hospice. Pt ill be followed by hospice and palliative care of [facility].   Discharge Diagnoses:  Principal Problem:   Lower GI bleed  Active Problems:   Protein-calorie malnutrition, severe   Weakness generalized   Hypertension   Dementia   Colonic mass   Anemia of chronic disease   Hypokalemia   Depression   Palliative care encounter   DNR (do not resuscitate)   Acute blood loss anemia   Colon cancer   Discharge Condition: guarded  Diet recommendation: Regular  Code status: DNR  Filed Weights   03/03/15 1542 03/04/15 0355  Weight: 66.6 kg (146 lb 13.2 oz) 71.3 kg (157 lb 3 oz)    History of present illness:  Please refer t admission H&P for details, in brief, 81 y.o. male, with past medical history of hypertension, colon mass and related rectal bleed, dementia, hospitalization in 12/2014 for symptomatic anemia and has required 4 units of PRBC transfusion. Pt presented to [facility] because he was notified by PCP that his hemoglobin was 6.1. Pt is not reliable historian due to dementia.   On admission, pt was hemodynamically stable. Hemoglobin was 5.7. Pt received 2 units PRBC during this hospital stay with appropriate rise in hemoglobin. He was on protonix drip on admission. Lower GI bleed secondary to colonic mass. Patient not a candidate for  any further intervention of his colonic mass. palliative care was consulted and GOC discussion done with pts wife who wished patient to come home with home hospice.     Hospital Course:  Principal Problem:  Lower GI bleed / Rectal bleed in the setting of colon mass / Acute blood loss anemia / Anemia of chronic disease  - Secondary to colonic mass.  - S/P 2 units  PRBC transfusion during this hospital stay. -continue PPI. -palliative care discussion with pts wife on [place] . Given his deconditioning wife wished for comfort, refusing for unnecessary tests or procedures. She was open for transfusion in future if it provide comfort.   Active Problems:  Hypokalemia -repleted    Essential hypertension - held all meds due to low BP. BP stable this am . May resume some of his BP meds  as outpt if BP elevated.    Dementia / Depression - Continue Aricept daily  - Continue Sertraline 200 mg daily   Dyslipidemia - Continue statin    Protein-calorie malnutrition, severe - secondary to underlying  illness - added nutritional supplementation   Consults: palliative care   Family Communication:spoke with wife the phone  Disposition Plan: Home with hospice   Discharge Exam: Filed Vitals:   03/08/15 0500  BP: 126/73  Pulse: 92  Temp: 98.4 F (36.9 C)  Resp: 18    General: elderly male cachetic, hard of hearing, not in distress HEENT: palor+, moist mucosa, supple neck Chest: clear b/l, no added sounds CVS: NS1&S2, no murmurs GI: soft, NT, ND, BS+ Musculoskeletal: warm, no edema  CNS: AAOX2, non focal  Discharge Instructions    Current Discharge Medication List    START taking these medications   Details  feeding supplement, ENSURE ENLIVE, (ENSURE ENLIVE) LIQD Take 237 mLs by mouth 2 (two) times daily between meals. Qty: 60 Bottle, Refills: 3      CONTINUE these medications which have  NOT CHANGED   Details  donepezil (ARICEPT) 10 MG tablet Take 10 mg by mouth at bedtime.    ferrous sulfate 325 (65 FE) MG tablet Take 1 tablet (325 mg total) by mouth 2 (two) times daily with a meal. Qty: 60 tablet, Refills: 1    pantoprazole (PROTONIX) 40 MG tablet Take 1 tablet (40 mg total) by mouth daily. Switch for any other PPI at similar dose and frequency Qty: 30 tablet, Refills: 3    sertraline (ZOLOFT) 100 MG tablet Take 200 mg by  mouth daily.    simvastatin (ZOCOR) 40 MG tablet Take 20 mg by mouth daily.      STOP taking these medications     amLODipine (NORVASC) 10 MG tablet      calcium carbonate (OS-CAL) 600 MG TABS tablet      furosemide (LASIX) 20 MG tablet      hydrALAZINE (APRESOLINE) 25 MG tablet      iron polysaccharides (NU-IRON) 150 MG capsule      metoprolol (LOPRESSOR) 50 MG tablet        No Known Allergies Follow-up Information    Please follow up.   Why:  home hospice       The results of significant diagnostics from this hospitalization (including imaging, microbiology, ancillary and laboratory) are listed below for reference.    Significant Diagnostic Studies: No results found.  Microbiology: Recent Results (from the past 240 hour(s))  MRSA PCR Screening     Status: None   Collection Time: 03/03/15  3:54 PM  Result Value Ref Range Status   MRSA by PCR NEGATIVE NEGATIVE Final    Comment:        The GeneXpert MRSA Assay (FDA approved for NASAL specimens only), is one component of a comprehensive MRSA colonization surveillance program. It is not intended to diagnose MRSA infection nor to guide or monitor treatment for MRSA infections.   MRSA PCR Screening     Status: None   Collection Time: 03/03/15 10:11 PM  Result Value Ref Range Status   MRSA by PCR NEGATIVE NEGATIVE Final    Comment:        The GeneXpert MRSA Assay (FDA approved for NASAL specimens only), is one component of a comprehensive MRSA colonization surveillance program. It is not intended to diagnose MRSA infection nor to guide or monitor treatment for MRSA infections.      Labs: Basic Metabolic Panel:  Recent Labs Lab 03/03/15 0800 03/04/15 0010 03/05/15 0446  NA 139 137 140  K 2.9* 3.4* 4.7  CL 99 106 108  CO2 31 29 29   GLUCOSE 89 80 99  BUN 12 10 9   CREATININE 0.97 0.92 0.99  CALCIUM 8.6 8.2* 8.5   Liver Function Tests:  Recent Labs Lab 03/03/15 0800  AST 24  ALT 11   ALKPHOS 61  BILITOT 0.4  PROT 6.3  ALBUMIN 2.9*   No results for input(s): LIPASE, AMYLASE in the last 168 hours. No results for input(s): AMMONIA in the last 168 hours. CBC:  Recent Labs Lab 03/03/15 0800 03/04/15 0010 03/05/15 0446 03/08/15 0537  WBC 4.6 5.1 5.8 5.9  NEUTROABS 3.3  --   --   --   HGB 5.7* 7.6* 8.0* 7.9*  HCT 19.5* 25.4* 27.1* 26.1*  MCV 73.6* 77.4* 78.3 76.8*  PLT 254 203 212 193   Cardiac Enzymes: No results for input(s): CKTOTAL, CKMB, CKMBINDEX, TROPONINI in the last 168 hours. BNP: BNP (last 3 results) No results for input(s):  BNP in the last 8760 hours.  ProBNP (last 3 results) No results for input(s): PROBNP in the last 8760 hours.  CBG: No results for input(s): GLUCAP in the last 168 hours.     SignedLouellen Molder  [facility] 03/08/2015, 11:29 AM

## 2015-03-08 NOTE — Progress Notes (Signed)
Nurse reviewed discharge instructions with pt's wife.  Pt's wife verbalized understanding of discharge instructions and follow up.  Home hospice arranged for pt.  No concerns at time of discharge.

## 2015-03-08 NOTE — Progress Notes (Addendum)
Notifed by [facility] of pt./family request for [facility] at home after discharge. Chart and patient information will be reviewed with Dr. Brigid Re [facility] Medical Director.  Writer spoke with pt.briefly at the bedside. Pt's wife, Inez Catalina was called and education initiated related to hospice philosophy, services and team approach to care. Inez Catalina voiced understanding of information provided.  Per discussion plan is for discharge to home by private vehicle today after 3 pm. Hospice will come to his house at 6 pm to do his admission.  Please send signed and completed DNR form home with patient. Pt. will need prescriptions for discharge comfort medications.  DME needs discussed and pt. has a walker and a cane at home. Pt's wife declines any other equipment at this time.  [facility] referral Center aware of above. [facility] information and contact numbers left at pt's bedside during visit. Above information shared with Vinnie Level, [facility]. Please call with any questions.  [facility] Liaison [phone]   145pm  Met with pt's wife in room to clarify her questions related to hospice.   Barbra Sarks RN

## 2015-05-07 DEATH — deceased
